# Patient Record
Sex: Male | Born: 1955 | Race: White | Hispanic: No | Marital: Married | Smoking: Never smoker
Health system: Southern US, Community
[De-identification: ages and names within clinical notes are randomized; demographics above are authoritative.]

## PROBLEM LIST (undated history)

## (undated) DIAGNOSIS — I10 Essential (primary) hypertension: Secondary | ICD-10-CM

## (undated) DIAGNOSIS — G4730 Sleep apnea, unspecified: Secondary | ICD-10-CM

## (undated) DIAGNOSIS — F419 Anxiety disorder, unspecified: Secondary | ICD-10-CM

## (undated) DIAGNOSIS — I499 Cardiac arrhythmia, unspecified: Secondary | ICD-10-CM

## (undated) DIAGNOSIS — E039 Hypothyroidism, unspecified: Secondary | ICD-10-CM

## (undated) DIAGNOSIS — I509 Heart failure, unspecified: Secondary | ICD-10-CM

## (undated) DIAGNOSIS — I4891 Unspecified atrial fibrillation: Secondary | ICD-10-CM

## (undated) DIAGNOSIS — N3281 Overactive bladder: Secondary | ICD-10-CM

## (undated) DIAGNOSIS — K219 Gastro-esophageal reflux disease without esophagitis: Secondary | ICD-10-CM

## (undated) DIAGNOSIS — M1990 Unspecified osteoarthritis, unspecified site: Secondary | ICD-10-CM

## (undated) HISTORY — DX: Cardiac arrhythmia, unspecified: I49.9

## (undated) HISTORY — DX: Heart failure, unspecified: I50.9

## (undated) HISTORY — DX: Unspecified atrial fibrillation: I48.91

## (undated) HISTORY — PX: KNEE ARTHROSCOPY: SUR90

## (undated) HISTORY — PX: HERNIA REPAIR: SHX51

## (undated) HISTORY — DX: Essential (primary) hypertension: I10

---

## 1998-05-18 ENCOUNTER — Ambulatory Visit (HOSPITAL_BASED_OUTPATIENT_CLINIC_OR_DEPARTMENT_OTHER): Admission: RE | Admit: 1998-05-18 | Discharge: 1998-05-18 | Payer: Self-pay | Admitting: Surgery

## 1998-08-08 ENCOUNTER — Ambulatory Visit (HOSPITAL_COMMUNITY): Admission: RE | Admit: 1998-08-08 | Discharge: 1998-08-08 | Payer: Self-pay | Admitting: *Deleted

## 1998-08-08 ENCOUNTER — Encounter: Payer: Self-pay | Admitting: *Deleted

## 2006-07-17 ENCOUNTER — Encounter: Admission: RE | Admit: 2006-07-17 | Discharge: 2006-07-17 | Payer: Self-pay | Admitting: Orthopedic Surgery

## 2006-09-08 ENCOUNTER — Encounter: Admission: RE | Admit: 2006-09-08 | Discharge: 2006-09-08 | Payer: Self-pay | Admitting: Orthopedic Surgery

## 2007-10-29 IMAGING — US US EXTREM LOW NON VASC*R*
1 series · 14 of 17 positions shown · non-contrast
Comparison: Outside MRI from [HOSPITAL] dated 06/27/06 and three phase bone scan of 07/17/06.

CLINICAL DATA: Palpable abnormality on left wrist.
FOCUSED RIGHT WRIST ULTRASOUND:
TECHNIQUE: Multiplanar grayscale with color ultrasound was performed at the patient's palpable abnormality about the radial aspect of the wrist.

[Series 1: us extrem low non vasc*right* · 0.06mm/px · 14 of 17 slices shown]
[im 1/17]
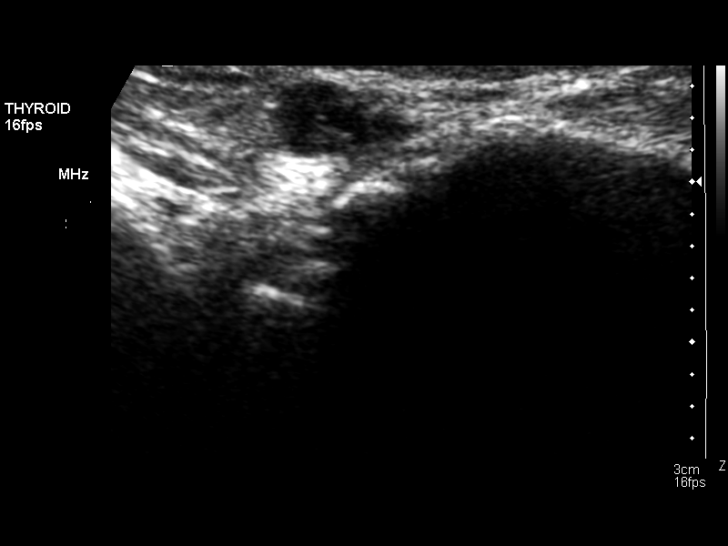
[im 2/17]
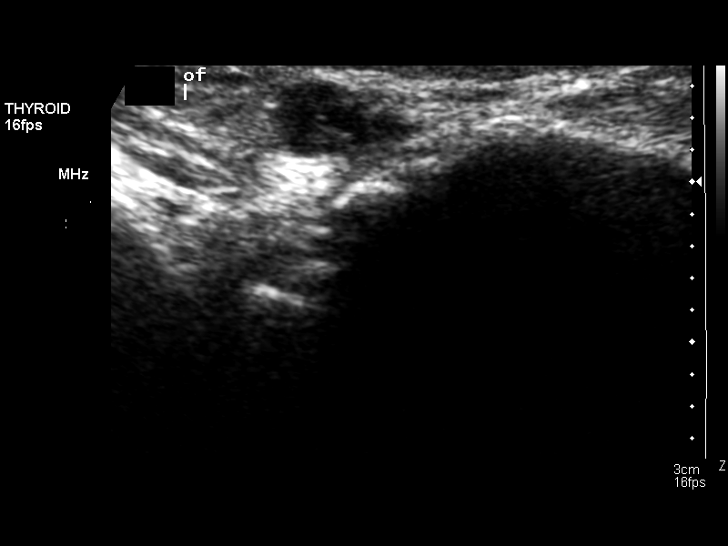
[im 4/17]
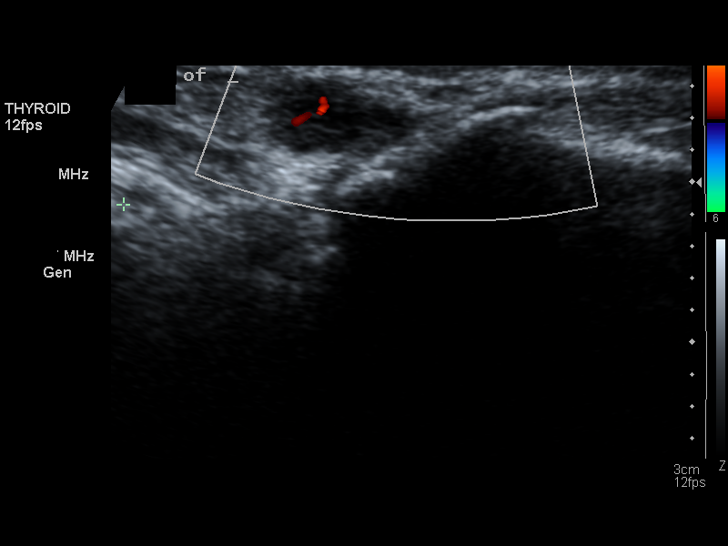
[im 5/17]
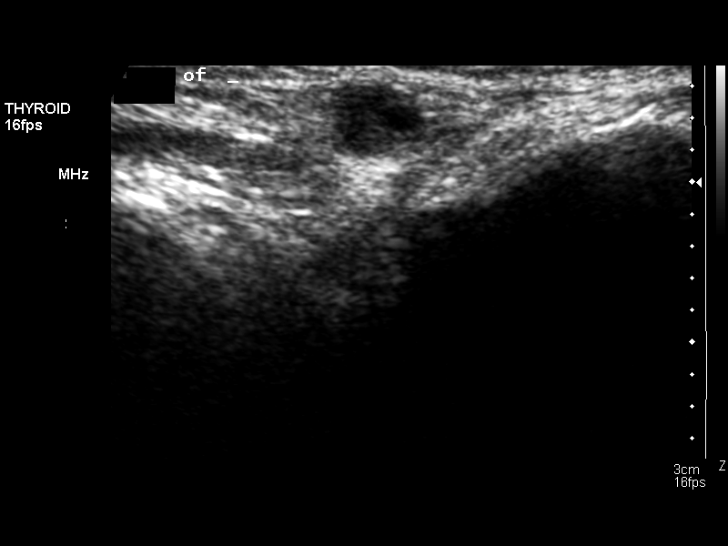
[im 6/17]
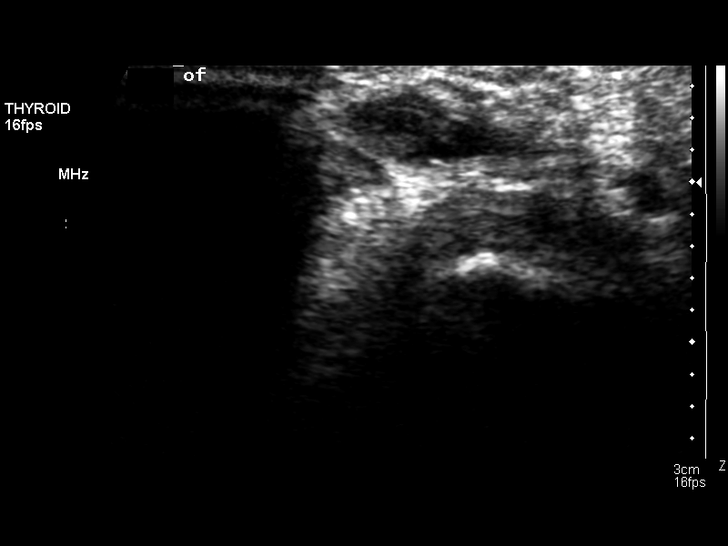
[im 7/17]
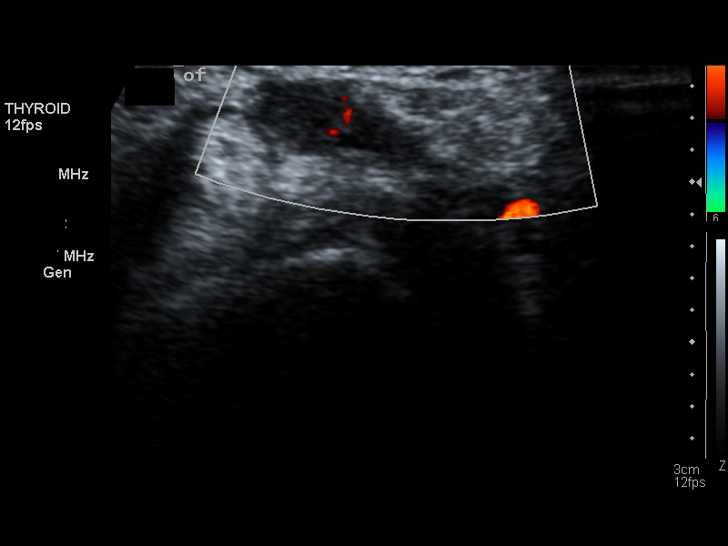
[im 8/17]
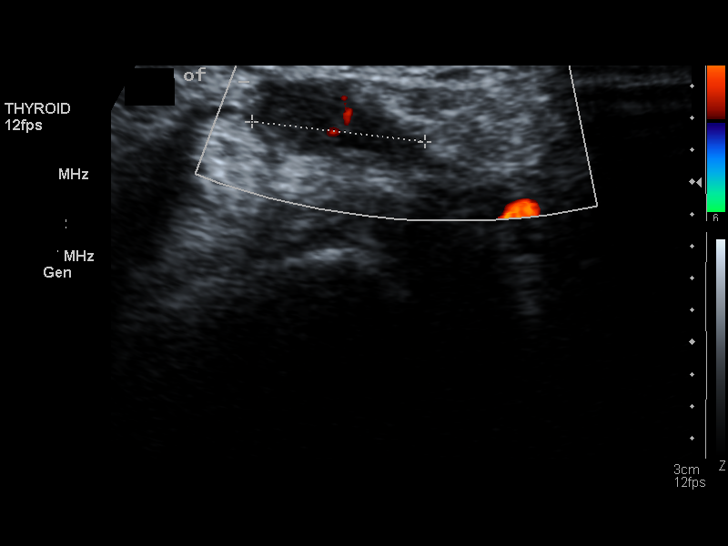
[im 10/17]
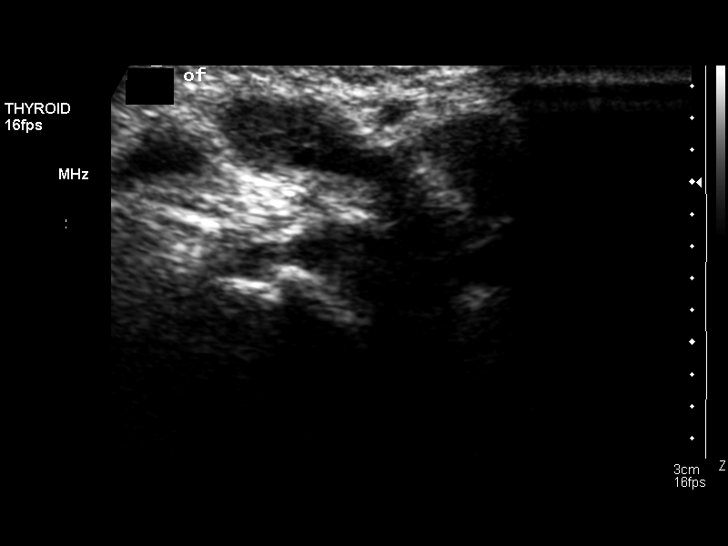
[im 11/17]
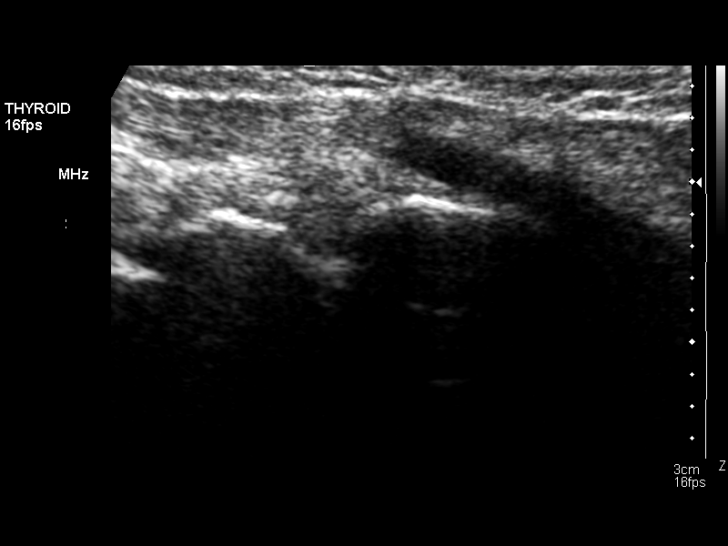
[im 12/17]
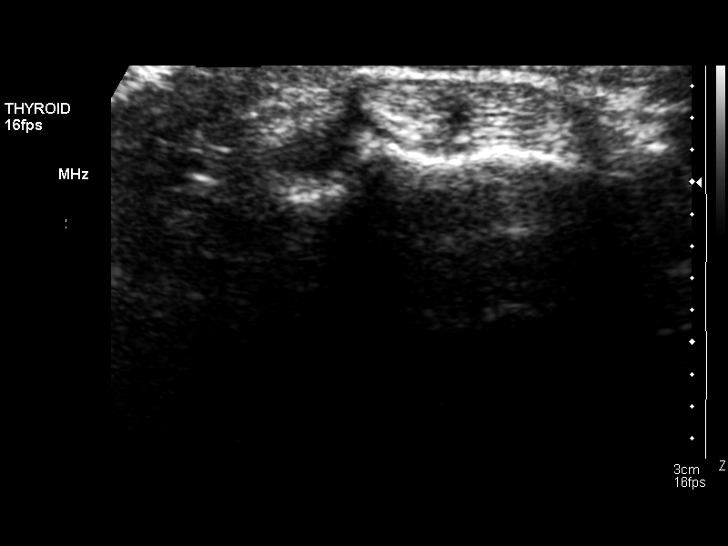
[im 13/17]
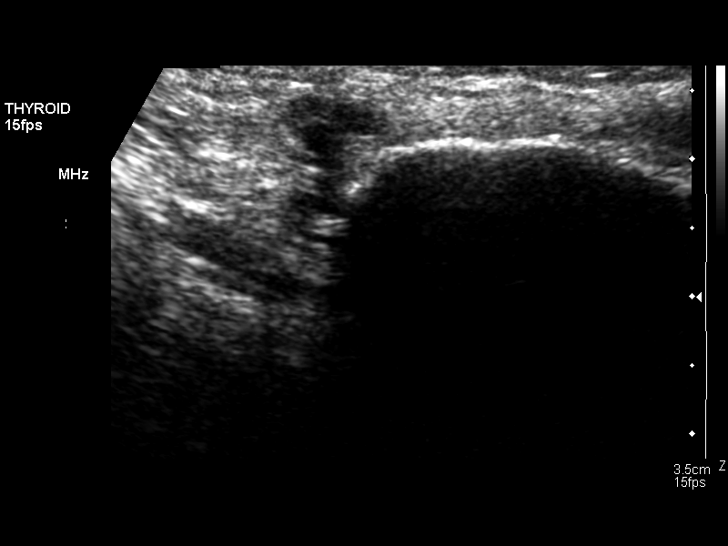
[im 14/17]
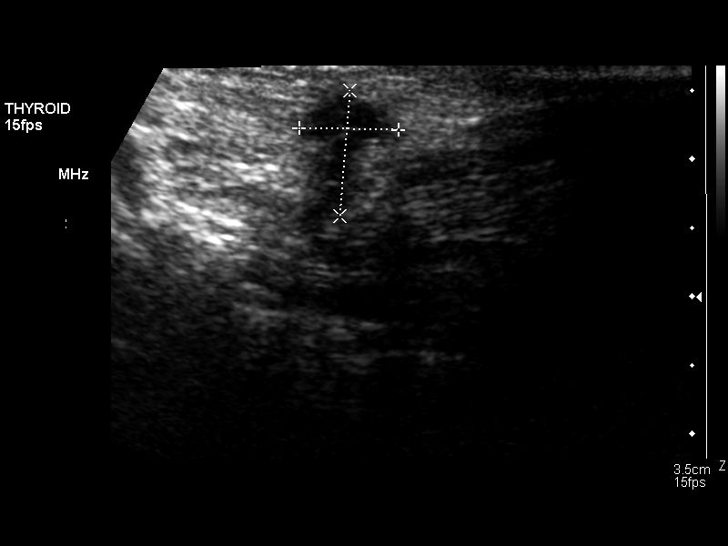
[im 16/17]
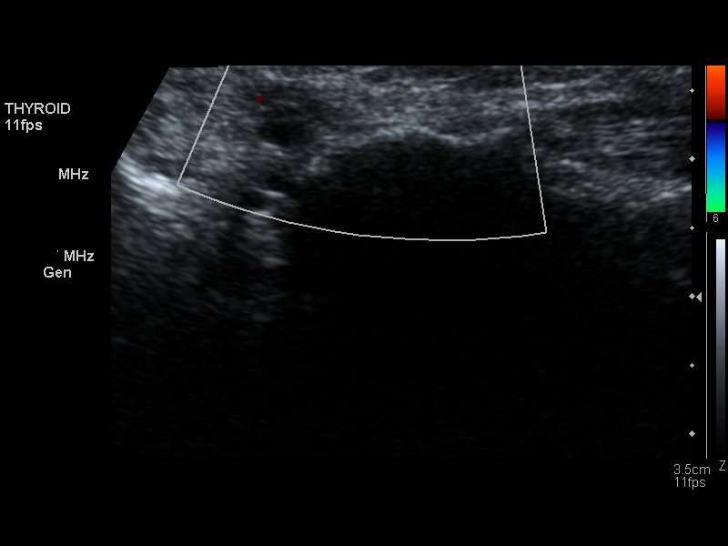
[im 17/17]
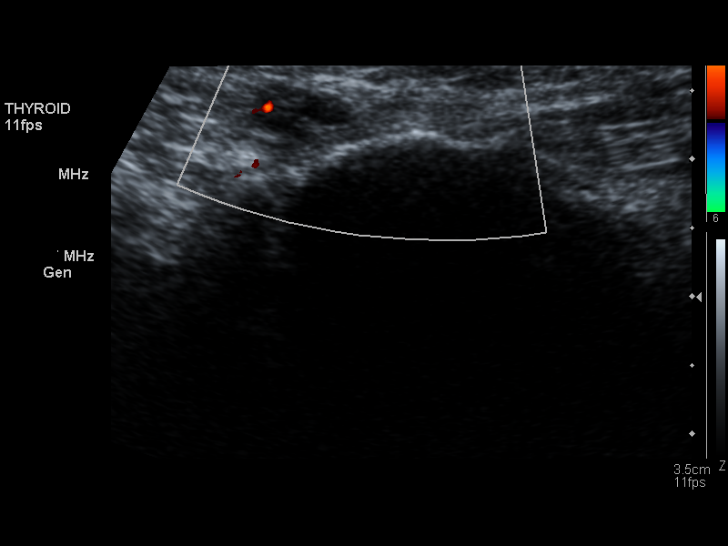

[14 of 17 positions shown; findings below may reference images not displayed]

FINDINGS: At the site of palpable abnormality about the radial aspect of the left wrist is a somewhat ill-defined irregularly shaped hypoechoic lesion which measures 9 x 5 x 11 mm.  This has areas of Doppler signal within consistent with vascularity (image 9).  It is heterogeneous without evidence of enhanced through transmission.
IMPRESSION: Nonspecific ultrasound appearance at the site of palpable abnormality about the radial aspect of the right wrist.  This does not have imaging characteristics to suggest a simple cyst.  There is internal vascularity and evidence of heterogeneity centrally.

## 2008-10-19 ENCOUNTER — Ambulatory Visit (HOSPITAL_BASED_OUTPATIENT_CLINIC_OR_DEPARTMENT_OTHER): Admission: RE | Admit: 2008-10-19 | Discharge: 2008-10-19 | Payer: Self-pay | Admitting: Urology

## 2008-10-19 ENCOUNTER — Encounter (INDEPENDENT_AMBULATORY_CARE_PROVIDER_SITE_OTHER): Payer: Self-pay | Admitting: Urology

## 2010-12-30 ENCOUNTER — Encounter: Payer: Self-pay | Admitting: Orthopedic Surgery

## 2011-04-23 NOTE — Op Note (Signed)
NAMEAMADOR, Roberto Sanford                ACCOUNT NO.:  [id number]   MEDICAL RECORD NO.:  [id number]          PATIENT TYPE:  AMB   LOCATION:  [facility]                         FACILITY:  [facility]   PHYSICIAN:  Maretta Bees. Vonita Moss, M.D.DATE OF BIRTH:  [birth year]   DATE OF PROCEDURE:  10/19/2008  DATE OF DISCHARGE:                               OPERATIVE REPORT   PREOPERATIVE DIAGNOSIS:  Rule out interstitial cystitis, penile wart.   POSTOPERATIVE DIAGNOSIS:  Rule out interstitial cystitis, penile wart  and urethral stricture.   PROCEDURE:  Cystoscopy, urethral dilation with filiform and followers  and HOD of the bladder and cold cup bladder biopsies. Excision of penile  wart.   SURGEON:  Maretta Bees. Vonita Moss, M.D.   ANESTHESIA:  General.   INDICATIONS:  This 55 year old gentleman has had a long history of  voiding symptoms with frequency, urgency and occasional urge  incontinence.  He has been treated with various anticholinergics and has  been on amitriptyline and needs further workup of his symptoms.  Also,  he has had a persistent wart on his dorsal right side of the penis that  has been treated with freezing techniques and we concluded that we would  excise it today.   PROCEDURE IN DETAIL:  The patient was brought to the operating room and  placed in lithotomy position.  He was prepped and draped in the usual  fashion.  A sharp excision of this right dorsal penile wart was  accomplished and the base was fulgurated with the electrocautery and the  wound closed with a running interlocking 4-0 chromic catgut.  The lesion  was sent to pathology.   I then cystoscoped him and he had two mild strictures in the bulbous  urethra and a very tight deep bulbous urethral stricture that required  placement of a guidewire and then dilation with Heyman dilators to 26  [place].  I then recystoscoped him and the deep bulbous stricture was  narrow and short.  The prostate was unremarkable.  The bladder had  some  erythema but it might be related to the guidewire.  He had a limited  bladder capacity of 500 mL.  Cold cup bladder biopsies were taken in  erythematous areas along the bladder base.  The biopsy sites were  fulgurated with the Bugbee electrode and he was taken to the recovery  room in good condition having received a B and O suppository and had  minimal blood loss.      Maretta Bees. Vonita Moss, M.D.  Electronically Signed     LJP/MEDQ  D:  10/19/2008  T:  10/19/2008  Job:  [id number]

## 2011-08-23 ENCOUNTER — Other Ambulatory Visit: Payer: Self-pay | Admitting: Urology

## 2011-08-23 ENCOUNTER — Ambulatory Visit (HOSPITAL_BASED_OUTPATIENT_CLINIC_OR_DEPARTMENT_OTHER)
Admission: RE | Admit: 2011-08-23 | Discharge: 2011-08-23 | Disposition: A | Discharge status: Home / Self Care | Payer: BC Managed Care – PPO | Source: Ambulatory Visit | Attending: Urology | Admitting: Urology

## 2011-08-23 DIAGNOSIS — N35919 Unspecified urethral stricture, male, unspecified site: Secondary | ICD-10-CM | POA: Insufficient documentation

## 2011-08-23 DIAGNOSIS — E162 Hypoglycemia, unspecified: Secondary | ICD-10-CM | POA: Insufficient documentation

## 2011-08-23 DIAGNOSIS — Z01810 Encounter for preprocedural cardiovascular examination: Secondary | ICD-10-CM | POA: Insufficient documentation

## 2011-08-23 DIAGNOSIS — Z79899 Other long term (current) drug therapy: Secondary | ICD-10-CM | POA: Insufficient documentation

## 2011-08-23 DIAGNOSIS — Z01812 Encounter for preprocedural laboratory examination: Secondary | ICD-10-CM | POA: Insufficient documentation

## 2011-08-23 DIAGNOSIS — R3915 Urgency of urination: Secondary | ICD-10-CM | POA: Insufficient documentation

## 2011-08-23 DIAGNOSIS — N342 Other urethritis: Secondary | ICD-10-CM | POA: Insufficient documentation

## 2011-08-23 DIAGNOSIS — G4733 Obstructive sleep apnea (adult) (pediatric): Secondary | ICD-10-CM | POA: Insufficient documentation

## 2011-08-23 DIAGNOSIS — R339 Retention of urine, unspecified: Secondary | ICD-10-CM | POA: Insufficient documentation

## 2011-08-23 DIAGNOSIS — R350 Frequency of micturition: Secondary | ICD-10-CM | POA: Insufficient documentation

## 2011-08-26 LAB — POCT HEMOGLOBIN-HEMACUE: Hemoglobin: 13.1 g/dL (ref 13.0–17.0)

## 2011-09-02 NOTE — Op Note (Signed)
NAMERAGE, BEEVER NO.:  [id number]  MEDICAL RECORD NO.:  [id number]  LOCATION:                               FACILITY:  [facility]  PHYSICIAN:  Jerilee Field, MD   DATE OF BIRTH:  November 02, 1956  DATE OF PROCEDURE: DATE OF DISCHARGE:                              OPERATIVE REPORT   PREOPERATIVE DIAGNOSIS: 1. Incomplete bladder emptying. 2. Urinary urgency and frequency. 3. Urethral stricture.  POSTOPERATIVE DIAGNOSIS: 1. Incomplete bladder emptying. 2. Urinary urgency and frequency. 3. Urethral stricture. 4. Urethritis.  PROCEDURE: 1. Cystoscopy with urethral biopsy and fulguration. 2. Balloon dilation of urethral stricture.  SURGEON:  Jerilee Field, MD.  ANESTHESIA:  General.  INDICATIONS FOR PROCEDURE:  Mr. Roberto Sanford has a long history of frequency and urgency, which has been worsening over recent months.  He underwent cystoscopy in the office which showed annular white exudate all along the bulb of urethra and in the deep bulb close to the membranous urethra, there was a tight urethral stricture of about 5 [place].  A uroflow was obtained, the patient voided with a slow string and had a postvoid over 200 mL.  I discussed the findings with the patient, the nature, risks, benefits, and alternatives to balloon dilation of stricture.  All questions were answered and the patient elected to proceed with balloon dilation.  We did discuss initial success rate as good with balloon dilation, but recurrence was likely.  FINDINGS:  On cystoscopy, the penile urethra was normal. All along the bulb of urethra, there were annular patches of white exudate.  For the most part, this did wash off with the scope.  He did have group B strep in the urine preoperatively and this may be of overgrowth of bacteria. A portion of this was biopsied to ensure no malignancy.  In the deep bulb there was a tight stricture of approximately 5 [place].  This balloon dilated without  difficulty.  The prostate was normal with moderate bilobar hypertrophy and 100% visual occlusion.  The bladder was normal apart from some midline irritation from the guidewire that was advanced.  There were no stones, tumors, or foreign bodies in the bladder.  The trigone and ureteral orifices were also normal.  DESCRIPTION OF PROCEDURE:  After consent was obtained, the patient was brought to the operating room.  A time-out was formed to confirm the patient and procedure.  He was given preop antibiotics and SCDs were in place.  He was placed in lithotomy.  External genitalia prepped and draped in the usual fashion.  The meatus and urethra dilated to 24- [place] to lubricate the urethra without difficulty.  22-French rigid cystoscope was passed per urethra.  The previous findings were noted. Portion of the distal bulb was biopsied with the rigid biopsy forceps and sent to pathology and lightly fulgurated with the Bugbee electrode. Passing the scope in and out did scrape of most of this exudate and the penile and bulb urothelium otherwise appeared normal.  Representative photos were taken proximal to this in the deep bulb, a few millimeters from the membranous urethra, there was a tight stricture.  A sensor wire was advanced through this and up into  the bladder which was confirmed fluoroscopically.  24-French 10 cm balloon was then advanced over the wire and inflated.  The balloon showed wasting at the stricture and then opened fully.  The balloon was left inflated for 3 minutes and then removed.  Cystoscopy revealed excellent dilation of the stricture and cystoscopy was then carried out with the previous findings.  The bladder was inspected in its entirety with the 12-degree and the 70-degree lens. Scope was then removed and an 18-French Foley catheter was placed draining clear urine.  The balloon was inflated and seated at the bladder neck and left to gravity drainage. He was then  awakened, taken to recovery room in stable condition.  ESTIMATED BLOOD LOSS:  Less than 5 mL.  COMPLICATIONS:  None.  SPECIMENS:  Urethral biopsy.  DISPOSITION:  Patient stable to PACU.          ______________________________ Jerilee Field, MD     ME/MEDQ  D:  08/23/2011  T:  08/24/2011  Job:  [id number]  Electronically Signed by Jerilee Field MD on 09/02/2011 12:36:12 PM

## 2011-09-10 LAB — POCT HEMOGLOBIN-HEMACUE: Hemoglobin: 15.3

## 2013-04-13 ENCOUNTER — Other Ambulatory Visit (HOSPITAL_COMMUNITY): Payer: Self-pay | Admitting: Internal Medicine

## 2013-04-13 DIAGNOSIS — E669 Obesity, unspecified: Secondary | ICD-10-CM

## 2013-04-13 DIAGNOSIS — I219 Acute myocardial infarction, unspecified: Secondary | ICD-10-CM

## 2013-04-13 DIAGNOSIS — D599 Acquired hemolytic anemia, unspecified: Secondary | ICD-10-CM

## 2013-06-29 ENCOUNTER — Other Ambulatory Visit (HOSPITAL_COMMUNITY): Payer: Self-pay | Admitting: Internal Medicine

## 2013-06-29 DIAGNOSIS — E669 Obesity, unspecified: Secondary | ICD-10-CM

## 2013-08-04 ENCOUNTER — Ambulatory Visit (HOSPITAL_COMMUNITY)
Admission: RE | Admit: 2013-08-04 | Discharge: 2013-08-04 | Disposition: A | Discharge status: Home / Self Care | Payer: BC Managed Care – PPO | Source: Ambulatory Visit | Attending: Cardiovascular Disease | Admitting: Cardiovascular Disease

## 2013-08-04 DIAGNOSIS — E669 Obesity, unspecified: Secondary | ICD-10-CM | POA: Insufficient documentation

## 2015-04-11 ENCOUNTER — Other Ambulatory Visit: Payer: Self-pay | Admitting: Orthopedic Surgery

## 2015-04-25 ENCOUNTER — Encounter (HOSPITAL_BASED_OUTPATIENT_CLINIC_OR_DEPARTMENT_OTHER): Payer: Self-pay | Admitting: *Deleted

## 2015-04-27 ENCOUNTER — Ambulatory Visit (HOSPITAL_BASED_OUTPATIENT_CLINIC_OR_DEPARTMENT_OTHER)
Admission: RE | Admit: 2015-04-27 | Discharge: 2015-04-27 | Disposition: A | Discharge status: Home / Self Care | Payer: BLUE CROSS/BLUE SHIELD | Source: Ambulatory Visit | Attending: Orthopedic Surgery | Admitting: Orthopedic Surgery

## 2015-04-27 ENCOUNTER — Encounter (HOSPITAL_BASED_OUTPATIENT_CLINIC_OR_DEPARTMENT_OTHER)
Admission: RE | Disposition: A | Discharge status: Home / Self Care | Payer: Self-pay | Source: Ambulatory Visit | Attending: Orthopedic Surgery

## 2015-04-27 ENCOUNTER — Encounter (HOSPITAL_BASED_OUTPATIENT_CLINIC_OR_DEPARTMENT_OTHER): Payer: Self-pay | Admitting: Orthopedic Surgery

## 2015-04-27 ENCOUNTER — Ambulatory Visit (HOSPITAL_BASED_OUTPATIENT_CLINIC_OR_DEPARTMENT_OTHER): Payer: BLUE CROSS/BLUE SHIELD | Admitting: Certified Registered"

## 2015-04-27 DIAGNOSIS — G4730 Sleep apnea, unspecified: Secondary | ICD-10-CM | POA: Insufficient documentation

## 2015-04-27 DIAGNOSIS — M65341 Trigger finger, right ring finger: Secondary | ICD-10-CM | POA: Diagnosis not present

## 2015-04-27 DIAGNOSIS — Z9989 Dependence on other enabling machines and devices: Secondary | ICD-10-CM | POA: Insufficient documentation

## 2015-04-27 DIAGNOSIS — M1990 Unspecified osteoarthritis, unspecified site: Secondary | ICD-10-CM | POA: Insufficient documentation

## 2015-04-27 DIAGNOSIS — G5601 Carpal tunnel syndrome, right upper limb: Secondary | ICD-10-CM | POA: Insufficient documentation

## 2015-04-27 DIAGNOSIS — F419 Anxiety disorder, unspecified: Secondary | ICD-10-CM | POA: Insufficient documentation

## 2015-04-27 DIAGNOSIS — G5602 Carpal tunnel syndrome, left upper limb: Secondary | ICD-10-CM | POA: Diagnosis not present

## 2015-04-27 DIAGNOSIS — K219 Gastro-esophageal reflux disease without esophagitis: Secondary | ICD-10-CM | POA: Diagnosis not present

## 2015-04-27 DIAGNOSIS — Z79899 Other long term (current) drug therapy: Secondary | ICD-10-CM | POA: Insufficient documentation

## 2015-04-27 DIAGNOSIS — M65331 Trigger finger, right middle finger: Secondary | ICD-10-CM | POA: Insufficient documentation

## 2015-04-27 DIAGNOSIS — Z6841 Body Mass Index (BMI) 40.0 and over, adult: Secondary | ICD-10-CM | POA: Diagnosis not present

## 2015-04-27 DIAGNOSIS — Z791 Long term (current) use of non-steroidal anti-inflammatories (NSAID): Secondary | ICD-10-CM | POA: Insufficient documentation

## 2015-04-27 DIAGNOSIS — E039 Hypothyroidism, unspecified: Secondary | ICD-10-CM | POA: Insufficient documentation

## 2015-04-27 DIAGNOSIS — N3281 Overactive bladder: Secondary | ICD-10-CM | POA: Diagnosis not present

## 2015-04-27 HISTORY — PX: TRIGGER FINGER RELEASE: SHX641

## 2015-04-27 HISTORY — DX: Sleep apnea, unspecified: G47.30

## 2015-04-27 HISTORY — DX: Anxiety disorder, unspecified: F41.9

## 2015-04-27 HISTORY — PX: CARPAL TUNNEL RELEASE: SHX101

## 2015-04-27 HISTORY — DX: Unspecified osteoarthritis, unspecified site: M19.90

## 2015-04-27 HISTORY — DX: Gastro-esophageal reflux disease without esophagitis: K21.9

## 2015-04-27 HISTORY — DX: Hypothyroidism, unspecified: E03.9

## 2015-04-27 HISTORY — DX: Overactive bladder: N32.81

## 2015-04-27 LAB — POCT HEMOGLOBIN-HEMACUE: HEMOGLOBIN: 13.9 g/dL (ref 13.0–17.0)

## 2015-04-27 SURGERY — RELEASE, A1 PULLEY, FOR TRIGGER FINGER
Anesthesia: General | Site: Wrist | Laterality: Right

## 2015-04-27 MED ORDER — BUPIVACAINE HCL (PF) 0.25 % IJ SOLN
INTRAMUSCULAR | Status: AC
  Filled 2015-04-27: qty 30

## 2015-04-27 MED ORDER — MIDAZOLAM HCL 2 MG/2ML IJ SOLN
1.0000 mg | INTRAMUSCULAR | Status: DC | PRN
  Administered 2015-04-27: 2 mg via INTRAVENOUS

## 2015-04-27 MED ORDER — FENTANYL CITRATE (PF) 100 MCG/2ML IJ SOLN
INTRAMUSCULAR | Status: AC
  Filled 2015-04-27: qty 4

## 2015-04-27 MED ORDER — HYDROMORPHONE HCL 1 MG/ML IJ SOLN: 0.2500 mg | INTRAMUSCULAR | Status: DC | PRN

## 2015-04-27 MED ORDER — LIDOCAINE HCL (CARDIAC) 20 MG/ML IV SOLN
INTRAVENOUS | Status: DC | PRN
  Administered 2015-04-27: 50 mg via INTRAVENOUS

## 2015-04-27 MED ORDER — BUPIVACAINE HCL (PF) 0.25 % IJ SOLN
INTRAMUSCULAR | Status: DC | PRN
  Administered 2015-04-27: 10 mL

## 2015-04-27 MED ORDER — ONDANSETRON HCL 4 MG/2ML IJ SOLN
INTRAMUSCULAR | Status: DC | PRN
  Administered 2015-04-27: 4 mg via INTRAVENOUS

## 2015-04-27 MED ORDER — CHLORHEXIDINE GLUCONATE 4 % EX LIQD: 60.0000 mL | Freq: Once | CUTANEOUS | Status: DC

## 2015-04-27 MED ORDER — CEFAZOLIN SODIUM-DEXTROSE 2-3 GM-% IV SOLR: 2.0000 g | INTRAVENOUS | Status: DC

## 2015-04-27 MED ORDER — GLYCOPYRROLATE 0.2 MG/ML IJ SOLN: 0.2000 mg | Freq: Once | INTRAMUSCULAR | Status: DC | PRN

## 2015-04-27 MED ORDER — PROPOFOL 10 MG/ML IV BOLUS
INTRAVENOUS | Status: DC | PRN
  Administered 2015-04-27: 30 mg via INTRAVENOUS
  Administered 2015-04-27: 200 mg via INTRAVENOUS
  Administered 2015-04-27: 100 mg via INTRAVENOUS

## 2015-04-27 MED ORDER — CEFAZOLIN SODIUM-DEXTROSE 2-3 GM-% IV SOLR
INTRAVENOUS | Status: AC
  Filled 2015-04-27: qty 50

## 2015-04-27 MED ORDER — PROPOFOL 500 MG/50ML IV EMUL
INTRAVENOUS | Status: AC
  Filled 2015-04-27: qty 50

## 2015-04-27 MED ORDER — FENTANYL CITRATE (PF) 100 MCG/2ML IJ SOLN
INTRAMUSCULAR | Status: DC | PRN
  Administered 2015-04-27: 50 ug via INTRAVENOUS
  Administered 2015-04-27: 100 ug via INTRAVENOUS

## 2015-04-27 MED ORDER — HYDROCODONE-ACETAMINOPHEN 5-325 MG PO TABS: 1.0000 | ORAL_TABLET | Freq: Four times a day (QID) | ORAL | Status: DC | PRN

## 2015-04-27 MED ORDER — DEXAMETHASONE SODIUM PHOSPHATE 10 MG/ML IJ SOLN
INTRAMUSCULAR | Status: DC | PRN
  Administered 2015-04-27: 10 mg via INTRAVENOUS

## 2015-04-27 MED ORDER — MIDAZOLAM HCL 2 MG/2ML IJ SOLN
INTRAMUSCULAR | Status: AC
  Filled 2015-04-27: qty 2

## 2015-04-27 MED ORDER — MEPERIDINE HCL 25 MG/ML IJ SOLN: 6.2500 mg | INTRAMUSCULAR | Status: DC | PRN

## 2015-04-27 MED ORDER — CEFAZOLIN SODIUM 1-5 GM-% IV SOLN
INTRAVENOUS | Status: AC
  Filled 2015-04-27: qty 50

## 2015-04-27 MED ORDER — DEXTROSE 5 % IV SOLN
3.0000 g | INTRAVENOUS | Status: AC
  Administered 2015-04-27: 3 g via INTRAVENOUS

## 2015-04-27 MED ORDER — OXYCODONE HCL 5 MG PO TABS: 5.0000 mg | ORAL_TABLET | Freq: Once | ORAL | Status: DC | PRN

## 2015-04-27 MED ORDER — FENTANYL CITRATE (PF) 100 MCG/2ML IJ SOLN: 50.0000 ug | INTRAMUSCULAR | Status: DC | PRN

## 2015-04-27 MED ORDER — LACTATED RINGERS IV SOLN
INTRAVENOUS | Status: DC
  Administered 2015-04-27 (×2): via INTRAVENOUS

## 2015-04-27 MED ORDER — OXYCODONE HCL 5 MG/5ML PO SOLN: 5.0000 mg | Freq: Once | ORAL | Status: DC | PRN

## 2015-04-27 SURGICAL SUPPLY — 39 items
BANDAGE COBAN STERILE 2 (GAUZE/BANDAGES/DRESSINGS) IMPLANT
BLADE SURG 15 STRL LF DISP TIS (BLADE) ×2 IMPLANT
BLADE SURG 15 STRL SS (BLADE) ×1
BNDG COHESIVE 3X5 TAN STRL LF (GAUZE/BANDAGES/DRESSINGS) ×3 IMPLANT
BNDG ESMARK 4X9 LF (GAUZE/BANDAGES/DRESSINGS) IMPLANT
BNDG GAUZE ELAST 4 BULKY (GAUZE/BANDAGES/DRESSINGS) ×3 IMPLANT
CHLORAPREP W/TINT 26ML (MISCELLANEOUS) ×3 IMPLANT
CORDS BIPOLAR (ELECTRODE) ×3 IMPLANT
COVER BACK TABLE 60X90IN (DRAPES) ×3 IMPLANT
COVER MAYO STAND STRL (DRAPES) ×3 IMPLANT
CUFF TOURNIQUET SINGLE 18IN (TOURNIQUET CUFF) ×3 IMPLANT
DECANTER SPIKE VIAL GLASS SM (MISCELLANEOUS) IMPLANT
DRAPE EXTREMITY T 121X128X90 (DRAPE) ×3 IMPLANT
DRAPE SURG 17X23 STRL (DRAPES) ×3 IMPLANT
DRSG PAD ABDOMINAL 8X10 ST (GAUZE/BANDAGES/DRESSINGS) ×3 IMPLANT
GAUZE SPONGE 4X4 12PLY STRL (GAUZE/BANDAGES/DRESSINGS) ×3 IMPLANT
GAUZE XEROFORM 1X8 LF (GAUZE/BANDAGES/DRESSINGS) ×3 IMPLANT
GLOVE BIO SURGEON STRL SZ 6.5 (GLOVE) ×3 IMPLANT
GLOVE BIOGEL PI IND STRL 7.0 (GLOVE) ×2 IMPLANT
GLOVE BIOGEL PI IND STRL 7.5 (GLOVE) ×2 IMPLANT
GLOVE BIOGEL PI IND STRL 8.5 (GLOVE) ×2 IMPLANT
GLOVE BIOGEL PI INDICATOR 7.0 (GLOVE) ×1
GLOVE BIOGEL PI INDICATOR 7.5 (GLOVE) ×1
GLOVE BIOGEL PI INDICATOR 8.5 (GLOVE) ×1
GLOVE SURG ORTHO 8.0 STRL STRW (GLOVE) ×3 IMPLANT
GLOVE SURG SS PI 7.5 STRL IVOR (GLOVE) ×3 IMPLANT
GOWN STRL REUS W/ TWL LRG LVL3 (GOWN DISPOSABLE) ×2 IMPLANT
GOWN STRL REUS W/TWL LRG LVL3 (GOWN DISPOSABLE) ×1
GOWN STRL REUS W/TWL XL LVL3 (GOWN DISPOSABLE) ×6 IMPLANT
NEEDLE PRECISIONGLIDE 27X1.5 (NEEDLE) ×3 IMPLANT
NS IRRIG 1000ML POUR BTL (IV SOLUTION) ×3 IMPLANT
PACK BASIN DAY SURGERY FS (CUSTOM PROCEDURE TRAY) ×3 IMPLANT
STOCKINETTE 4X48 STRL (DRAPES) ×3 IMPLANT
SUT ETHILON 4 0 PS 2 18 (SUTURE) ×3 IMPLANT
SUT VICRYL 4-0 PS2 18IN ABS (SUTURE) IMPLANT
SYR BULB 3OZ (MISCELLANEOUS) ×3 IMPLANT
SYR CONTROL 10ML LL (SYRINGE) ×3 IMPLANT
TOWEL OR 17X24 6PK STRL BLUE (TOWEL DISPOSABLE) ×3 IMPLANT
UNDERPAD 30X30 (UNDERPADS AND DIAPERS) ×3 IMPLANT

## 2015-04-27 NOTE — Anesthesia Preprocedure Evaluation (Signed)
Anesthesia Evaluation  Patient identified by MRN, date of birth, ID band Patient awake    Reviewed: Allergy & Precautions, NPO status , Patient's Chart, lab work & pertinent test results  Airway Mallampati: II  TM Distance: >3 FB Neck ROM: Full    Dental  (+) Teeth Intact, Dental Advisory Given   Pulmonary  breath sounds clear to auscultation        Cardiovascular Rhythm:Regular Rate:Normal     Neuro/Psych    GI/Hepatic   Endo/Other  Hypothyroidism Morbid obesity  Renal/GU      Musculoskeletal   Abdominal   Peds  Hematology   Anesthesia Other Findings   Reproductive/Obstetrics                             Anesthesia Physical Anesthesia Plan  ASA: II  Anesthesia Plan: General   Post-op Pain Management:    Induction: Intravenous  Airway Management Planned: LMA  Additional Equipment:   Intra-op Plan:   Post-operative Plan: Extubation in OR  Informed Consent: I have reviewed the patients History and Physical, chart, labs and discussed the procedure including the risks, benefits and alternatives for the proposed anesthesia with the patient or authorized representative who has indicated his/her understanding and acceptance.   Dental advisory given  Plan Discussed with: CRNA, Anesthesiologist and Surgeon  Anesthesia Plan Comments:         Anesthesia Quick Evaluation

## 2015-04-27 NOTE — Brief Op Note (Signed)
04/27/2015  10:19 AM  PATIENT:  Roberto Sanford  59 y.o. male  PRE-OPERATIVE DIAGNOSIS:  TRIGGER RIGHT MIDDLE FINGER, TRIGGER RIGHT RING FINGER RIGHT CARPAL TUNNEL SYNDROME   POST-OPERATIVE DIAGNOSIS:  TRIGGER RIGHT MIDDLE FINGER, TRIGGER RIGHT RING FINGER RIGHT CARPAL TUNNEL SYNDROME   PROCEDURE:  Procedure(s): RELEASE A-1 PULLEY PULLEY RIGHT MIDDLE FINGER, RELEASE A-1 PULLEY RIGHT RING FINGER  (Right) CARPAL TUNNEL RELEASE RIGHT  (Right)  SURGEON:  Surgeon(s) and Role:    * Daryll Brod, MD - Primary  PHYSICIAN ASSISTANT:   ASSISTANTS: none   ANESTHESIA:   local and general  EBL:  Total I/O In: 1000 [I.V.:1000] Out: -   BLOOD ADMINISTERED:none  DRAINS: none   LOCAL MEDICATIONS USED:  BUPIVICAINE   SPECIMEN:  No Specimen  DISPOSITION OF SPECIMEN:  N/A  COUNTS:  YES  TOURNIQUET:   Total Tourniquet Time Documented: Forearm (Right) - 23 minutes Total: Forearm (Right) - 23 minutes   DICTATION: .Other Dictation: Dictation Number [id number]  PLAN OF CARE: Discharge to home after PACU  PATIENT DISPOSITION:  PACU - hemodynamically stable.

## 2015-04-27 NOTE — Op Note (Signed)
Dictation Number [id number]

## 2015-04-27 NOTE — Discharge Instructions (Signed)

## 2015-04-27 NOTE — H&P (Signed)
Roberto Sanford is a 59 year-old right-hand dominant male who has not been seen in years.  He is complaining of catching of his right ring and right middle fingers. This has been going on for six months.  He has prior history of carpal tunnel syndrome which I saw him for multiple years ago.  He has not had any further treatment to this. He recalls no treatment of injury to his hand, no history of injury to his neck.  He has history of thyroid problems, arthritis,  he is prediabetic.  There is family history of diabetes and arthritis.  He complains of a constant, moderate to severe, sharp burning type pain with a feeling of numbness awakening him at night.  He states this is gradually getting worse.  Stretching seems to help. He is on Celebrex.    ALLERGIES:    None.  MEDICATIONS:     Glucosamine and chondroitin sulfate, fish oil, pantoprazole, Celebrex, Vesicare, levothyroxine, vitamin B6, and amitriptyline.    SURGICAL HISTORY:      Hernia repair and knee surgery.  FAMILY MEDICAL HISTORY:     Positive for diabetes, arthritis.  SOCIAL HISTORY:     He does not smoke or drink.  He is a Administrator for [employer].    REVIEW OF SYSTEMS:    Negative 14 points. Roberto Sanford is an 59 y.o. male.   Chief Complaint: CTS right hand and trigger fingers right middle and ring fingers HPI: see above  Past Medical History  Diagnosis Date  . Sleep apnea     uses CPAP nightly  . Anxiety   . Arthritis   . GERD (gastroesophageal reflux disease)   . Overactive bladder     takes vesicare and elavil  . Hypothyroidism     Past Surgical History  Procedure Laterality Date  . Hernia repair    . Knee arthroscopy Left     History reviewed. No pertinent family history. Social History:  reports that he has never smoked. He does not have any smokeless tobacco history on file. He reports that he does not drink alcohol or use illicit drugs.  Allergies: No Known Allergies  No prescriptions prior to admission    No  results found for this or any previous visit (from the past 48 hour(s)).  No results found.   Pertinent items are noted in HPI.  Height 5\' 8"  (1.727 m), weight 124.739 kg (275 lb).  General appearance: alert, cooperative and appears stated age Head: Normocephalic, without obvious abnormality Neck: no JVD Resp: clear to auscultation bilaterally Cardio: regular rate and rhythm, S1, S2 normal, no murmur, click, rub or gallop GI: soft, non-tender; bowel sounds normal; no masses,  no organomegaly Extremities: numbness right hand catching middle and ring fingers right Pulses: 2+ and symmetric Skin: Skin color, texture, turgor normal. No rashes or lesions Neurologic: Grossly normal Incision/Wound: na  Assessment/Plan NERVE STUDIES:   Nerve conductions were performed today revealing carpal tunnel syndrome bilaterally with motor delay of 4.2 on the left no acute distress 4.1 right;  sensory delay of 2.8 left no acute distress 2.6 right;  amplitude diminution 24 left and 23.7 right.  DIAGNOSIS:      Stenosing tenosynovitis right middle, right ring fingers.  Bilateral carpal tunnel syndrome.    RECOMMENDATIONS/PLAN:    We discussed possibility of injection of these with him.  He has deferred this and would like to proceed to surgical intervention.  The pre, peri and postoperative course were discussed along  with the risks and complications.  The patient is aware there is no guarantee with the surgery, possibility of infection, recurrence, injury to arteries, nerves, tendons, incomplete relief of symptoms and dystrophy.  He is tentatively scheduled for  release A-1 pulley right middle, right ring finger, and carpal tunnel release right hand.  Roberto Sanford R 04/27/2015, 6:39 AM

## 2015-04-27 NOTE — Anesthesia Postprocedure Evaluation (Signed)
  Anesthesia Post-op Note  Patient: Roberto Sanford  Procedure(s) Performed: Procedure(s): RELEASE A-1 PULLEY PULLEY RIGHT MIDDLE FINGER, RELEASE A-1 PULLEY RIGHT RING FINGER  (Right) CARPAL TUNNEL RELEASE RIGHT  (Right)  Patient Location: PACU  Anesthesia Type: General   Level of Consciousness: awake, alert  and oriented  Airway and Oxygen Therapy: Patient Spontanous Breathing  Post-op Pain: mild  Post-op Assessment: Post-op Vital signs reviewed  Post-op Vital Signs: Reviewed  Last Vitals:  Filed Vitals:   04/27/15 1130  BP: 156/93  Pulse: 64  Temp: 36.4 C  Resp: 20    Complications: No apparent anesthesia complications

## 2015-04-27 NOTE — Anesthesia Procedure Notes (Signed)
Procedure Name: LMA Insertion Date/Time: 04/27/2015 9:39 AM Performed by: Baxter Flattery Pre-anesthesia Checklist: Patient identified, Emergency Drugs available, Suction available and Patient being monitored Patient Re-evaluated:Patient Re-evaluated prior to inductionOxygen Delivery Method: Circle System Utilized Preoxygenation: Pre-oxygenation with 100% oxygen Intubation Type: IV induction Ventilation: Mask ventilation without difficulty LMA: LMA inserted LMA Size: 5.0 Number of attempts: 1 Airway Equipment and Method: Bite block Placement Confirmation: positive ETCO2 and breath sounds checked- equal and bilateral Tube secured with: Tape Dental Injury: Teeth and Oropharynx as per pre-operative assessment

## 2015-04-27 NOTE — Transfer of Care (Signed)
Immediate Anesthesia Transfer of Care Note  Patient: Roberto Sanford  Procedure(s) Performed: Procedure(s): RELEASE A-1 PULLEY PULLEY RIGHT MIDDLE FINGER, RELEASE A-1 PULLEY RIGHT RING FINGER  (Right) CARPAL TUNNEL RELEASE RIGHT  (Right)  Patient Location: PACU  Anesthesia Type:General  Level of Consciousness: awake, sedated and responds to stimulation  Airway & Oxygen Therapy: Patient Spontanous Breathing and Patient connected to face mask oxygen  Post-op Assessment: Report given to RN, Post -op Vital signs reviewed and stable and Patient moving all extremities  Post vital signs: Reviewed and stable  Last Vitals:  Filed Vitals:   04/27/15 0822  BP: 137/83  Pulse: 63  Temp: 36.6 C  Resp: 20    Complications: No apparent anesthesia complications

## 2015-04-28 ENCOUNTER — Encounter (HOSPITAL_BASED_OUTPATIENT_CLINIC_OR_DEPARTMENT_OTHER): Payer: Self-pay | Admitting: Orthopedic Surgery

## 2015-04-28 NOTE — Op Note (Signed)
Roberto Sanford, Roberto Sanford                ACCOUNT NO.:  [id number]  MEDICAL RECORD NO.:  [id number]  LOCATION:                                FACILITY:  [facility]  PHYSICIAN:  Daryll Brod, M.D.       DATE OF BIRTH:  23-Sep-1956  DATE OF PROCEDURE:  04/27/2015 DATE OF DISCHARGE:  04/27/2015                              OPERATIVE REPORT   PREOPERATIVE DIAGNOSES:  Stenosing tenosynovitis, right middle, right ring finger, with carpal tunnel syndrome, right hand.  POSTOPERATIVE DIAGNOSES:  Stenosing tenosynovitis, right middle, right ring finger, with carpal tunnel syndrome, right hand.  OPERATIONS: 1. Decompression of right median nerve. 2. Release of A1 pulley, right middle, right ring fingers.  SURGEON:  Daryll Brod, M.D.  ANESTHESIA:  General with local infiltration.  ANESTHESIOLOGIST:  Lorrene Reid, M.D.  HISTORY:  The patient is a 59 year old male with a history of triggering of his middle and ring finger, right hand, along with carpal tunnel syndrome.  Nerve conductions are positive.  This has not responded to conservative treatment and he has elected to undergo surgical release of the A1 pulley of the right middle and right ring fingers and carpal tunnel release of his right hand.  He is aware that there is no guarantee with the surgery; possibility of infection; recurrence of injury to arteries, nerves, tendons; incomplete relief of symptoms; and dystrophy.  In the preoperative area, the patient was seen, the extremity marked by both the patient and surgeon.  Antibiotics given.  PROCEDURE IN DETAIL:  The patient was brought to the operating room, where a general anesthetic was carried out without difficulty.  He was prepped using ChloraPrep in supine position with right arm free.  A 3- minute dry time was allowed.  Time-out taken, confirming the patient and procedure.  The middle finger was attended to first.  An oblique incision was made over the A1 pulley of the right middle finger,  carried down through subcutaneous tissue.  Bleeders were electrocauterized. Dissection carried down to the A1 pulley.  Retractors placed, protecting neurovascular structures radially and ulnarly.  The A1 pulley was released on its radial aspect.  Small incision made centrally in A2. Partial tenosynovectomy performed proximally with separation of the 2 tendons to be certain they were not adherent.  Finger was placed through a full range of motion, no further triggering was noted.  A separate incision was then made obliquely over the ring finger A1 pulley, carried down through subcutaneous tissue.  Retractor was again placed, protecting neurovascular structures radially and ulnarly.  An incision was made on the radial aspect of the A1 pulley.  The small incision made centrally in A2.  Partial tenosynovectomy with separation of superficialis profundus tendon was then performed to be certain there was no adherence to each of these.  Wounds were then irrigated and closed with interrupted 4-0 nylon sutures.  No further triggering was noted in either with passive flexion, extension.  A separate incision was then made longitudinally in the right palm, carried down through subcutaneous tissue.  Bleeders were electrocauterized with bipolar. Palmar fascia was split.  Superficial palmar arch identified.  The flexor tendon to the ring and  little fingers identified to the ulnar side of the median nerve.  The carpal retinaculum was incised with sharp dissection.  Right angle and Sewall retractor were placed between skin and forearm fascia.  The fascia was released for approximately 2 cm proximal to the wrist crease under direct vision.  Canal was explored. Area of compression to the nerve was apparent.  Motor branch entered into muscle distally.  The wound was irrigated with saline and closed with interrupted 4-0 nylon sutures.  Local infiltration with 0.25% bupivacaine without epinephrine was given,  approximately 8 mL total was used.  A sterile compressive dressing with fingers free was applied.  On deflation of the tourniquet, all fingers immediately pinked.  He was taken to the recovery room for observation in satisfactory condition. He will be discharged home to return to the [facility] in 1 week on [place].          ______________________________ Daryll Brod, M.D.     GK/MEDQ  D:  04/27/2015  T:  04/28/2015  Job:  [id number]

## 2015-05-11 ENCOUNTER — Other Ambulatory Visit: Payer: Self-pay | Admitting: Orthopedic Surgery

## 2015-05-17 ENCOUNTER — Encounter (HOSPITAL_BASED_OUTPATIENT_CLINIC_OR_DEPARTMENT_OTHER): Payer: Self-pay | Admitting: *Deleted

## 2015-05-18 ENCOUNTER — Ambulatory Visit (HOSPITAL_BASED_OUTPATIENT_CLINIC_OR_DEPARTMENT_OTHER)
Admission: RE | Admit: 2015-05-18 | Discharge: 2015-05-18 | Disposition: A | Discharge status: Home / Self Care | Payer: BLUE CROSS/BLUE SHIELD | Source: Ambulatory Visit | Attending: Orthopedic Surgery | Admitting: Orthopedic Surgery

## 2015-05-18 ENCOUNTER — Encounter (HOSPITAL_BASED_OUTPATIENT_CLINIC_OR_DEPARTMENT_OTHER)
Admission: RE | Disposition: A | Discharge status: Home / Self Care | Payer: Self-pay | Source: Ambulatory Visit | Attending: Orthopedic Surgery

## 2015-05-18 ENCOUNTER — Ambulatory Visit (HOSPITAL_BASED_OUTPATIENT_CLINIC_OR_DEPARTMENT_OTHER): Payer: BLUE CROSS/BLUE SHIELD | Admitting: Certified Registered"

## 2015-05-18 ENCOUNTER — Encounter (HOSPITAL_BASED_OUTPATIENT_CLINIC_OR_DEPARTMENT_OTHER): Payer: Self-pay | Admitting: Orthopedic Surgery

## 2015-05-18 DIAGNOSIS — M65331 Trigger finger, right middle finger: Secondary | ICD-10-CM | POA: Insufficient documentation

## 2015-05-18 DIAGNOSIS — Z79899 Other long term (current) drug therapy: Secondary | ICD-10-CM | POA: Insufficient documentation

## 2015-05-18 DIAGNOSIS — R9431 Abnormal electrocardiogram [ECG] [EKG]: Secondary | ICD-10-CM | POA: Diagnosis not present

## 2015-05-18 DIAGNOSIS — Z6841 Body Mass Index (BMI) 40.0 and over, adult: Secondary | ICD-10-CM | POA: Insufficient documentation

## 2015-05-18 DIAGNOSIS — G5601 Carpal tunnel syndrome, right upper limb: Secondary | ICD-10-CM | POA: Diagnosis not present

## 2015-05-18 DIAGNOSIS — Z9989 Dependence on other enabling machines and devices: Secondary | ICD-10-CM | POA: Insufficient documentation

## 2015-05-18 DIAGNOSIS — G4730 Sleep apnea, unspecified: Secondary | ICD-10-CM | POA: Diagnosis not present

## 2015-05-18 DIAGNOSIS — N3281 Overactive bladder: Secondary | ICD-10-CM | POA: Diagnosis not present

## 2015-05-18 DIAGNOSIS — M1990 Unspecified osteoarthritis, unspecified site: Secondary | ICD-10-CM | POA: Diagnosis not present

## 2015-05-18 DIAGNOSIS — E039 Hypothyroidism, unspecified: Secondary | ICD-10-CM | POA: Diagnosis not present

## 2015-05-18 DIAGNOSIS — Z791 Long term (current) use of non-steroidal anti-inflammatories (NSAID): Secondary | ICD-10-CM | POA: Insufficient documentation

## 2015-05-18 DIAGNOSIS — G5602 Carpal tunnel syndrome, left upper limb: Secondary | ICD-10-CM | POA: Insufficient documentation

## 2015-05-18 DIAGNOSIS — I4891 Unspecified atrial fibrillation: Secondary | ICD-10-CM | POA: Insufficient documentation

## 2015-05-18 DIAGNOSIS — M65341 Trigger finger, right ring finger: Secondary | ICD-10-CM | POA: Diagnosis not present

## 2015-05-18 DIAGNOSIS — K219 Gastro-esophageal reflux disease without esophagitis: Secondary | ICD-10-CM | POA: Insufficient documentation

## 2015-05-18 HISTORY — PX: CARPAL TUNNEL RELEASE: SHX101

## 2015-05-18 LAB — POCT HEMOGLOBIN-HEMACUE: Hemoglobin: 16 g/dL (ref 13.0–17.0)

## 2015-05-18 SURGERY — CARPAL TUNNEL RELEASE
Anesthesia: Monitor Anesthesia Care | Laterality: Left

## 2015-05-18 MED ORDER — GLYCOPYRROLATE 0.2 MG/ML IJ SOLN: 0.2000 mg | Freq: Once | INTRAMUSCULAR | Status: DC | PRN

## 2015-05-18 MED ORDER — OXYCODONE HCL 5 MG/5ML PO SOLN: 5.0000 mg | Freq: Once | ORAL | Status: AC | PRN

## 2015-05-18 MED ORDER — BUPIVACAINE HCL (PF) 0.25 % IJ SOLN
INTRAMUSCULAR | Status: AC
  Filled 2015-05-18: qty 120

## 2015-05-18 MED ORDER — CHLORHEXIDINE GLUCONATE 4 % EX LIQD: 60.0000 mL | Freq: Once | CUTANEOUS | Status: DC

## 2015-05-18 MED ORDER — HYDROCODONE-ACETAMINOPHEN 5-325 MG PO TABS: 1.0000 | ORAL_TABLET | Freq: Four times a day (QID) | ORAL | Status: DC | PRN

## 2015-05-18 MED ORDER — ONDANSETRON HCL 4 MG/2ML IJ SOLN: 4.0000 mg | Freq: Four times a day (QID) | INTRAMUSCULAR | Status: DC | PRN

## 2015-05-18 MED ORDER — DEXTROSE 5 % IV SOLN
3.0000 g | INTRAVENOUS | Status: AC
  Administered 2015-05-18: 3 g via INTRAVENOUS

## 2015-05-18 MED ORDER — 0.9 % SODIUM CHLORIDE (POUR BTL) OPTIME
TOPICAL | Status: DC | PRN
  Administered 2015-05-18: 200 mL

## 2015-05-18 MED ORDER — PROPOFOL 500 MG/50ML IV EMUL
INTRAVENOUS | Status: AC
  Filled 2015-05-18: qty 50

## 2015-05-18 MED ORDER — PROPOFOL 10 MG/ML IV BOLUS
INTRAVENOUS | Status: DC | PRN
  Administered 2015-05-18: 30 mg via INTRAVENOUS

## 2015-05-18 MED ORDER — MIDAZOLAM HCL 2 MG/2ML IJ SOLN
INTRAMUSCULAR | Status: AC
  Filled 2015-05-18: qty 2

## 2015-05-18 MED ORDER — LIDOCAINE HCL (PF) 0.5 % IJ SOLN
INTRAMUSCULAR | Status: DC | PRN
  Administered 2015-05-18: 30 mL via INTRAVENOUS

## 2015-05-18 MED ORDER — LIDOCAINE HCL (CARDIAC) 20 MG/ML IV SOLN
INTRAVENOUS | Status: DC | PRN
  Administered 2015-05-18: 25 mg via INTRAVENOUS

## 2015-05-18 MED ORDER — FENTANYL CITRATE (PF) 100 MCG/2ML IJ SOLN
INTRAMUSCULAR | Status: AC
  Filled 2015-05-18: qty 4

## 2015-05-18 MED ORDER — CEFAZOLIN SODIUM 1-5 GM-% IV SOLN
INTRAVENOUS | Status: AC
  Filled 2015-05-18: qty 50

## 2015-05-18 MED ORDER — MIDAZOLAM HCL 2 MG/2ML IJ SOLN
1.0000 mg | INTRAMUSCULAR | Status: DC | PRN
  Administered 2015-05-18: 2 mg via INTRAVENOUS

## 2015-05-18 MED ORDER — PROPOFOL INFUSION 10 MG/ML OPTIME
INTRAVENOUS | Status: DC | PRN
  Administered 2015-05-18: 100 ug/kg/min via INTRAVENOUS

## 2015-05-18 MED ORDER — ONDANSETRON HCL 4 MG/2ML IJ SOLN
INTRAMUSCULAR | Status: DC | PRN
  Administered 2015-05-18: 4 mg via INTRAVENOUS

## 2015-05-18 MED ORDER — CEFAZOLIN SODIUM-DEXTROSE 2-3 GM-% IV SOLR
INTRAVENOUS | Status: AC
  Filled 2015-05-18: qty 50

## 2015-05-18 MED ORDER — OXYCODONE HCL 5 MG PO TABS
5.0000 mg | ORAL_TABLET | Freq: Once | ORAL | Status: AC | PRN
  Administered 2015-05-18: 5 mg via ORAL

## 2015-05-18 MED ORDER — FENTANYL CITRATE (PF) 100 MCG/2ML IJ SOLN
25.0000 ug | INTRAMUSCULAR | Status: DC | PRN
Start: 2015-05-18 — End: 2015-05-18

## 2015-05-18 MED ORDER — BUPIVACAINE HCL (PF) 0.25 % IJ SOLN
INTRAMUSCULAR | Status: DC | PRN
  Administered 2015-05-18: 7 mL

## 2015-05-18 MED ORDER — BUPIVACAINE HCL (PF) 0.25 % IJ SOLN
INTRAMUSCULAR | Status: AC
  Filled 2015-05-18: qty 30

## 2015-05-18 MED ORDER — FENTANYL CITRATE (PF) 100 MCG/2ML IJ SOLN
50.0000 ug | INTRAMUSCULAR | Status: DC | PRN
  Administered 2015-05-18 (×2): 50 ug via INTRAVENOUS

## 2015-05-18 MED ORDER — OXYCODONE HCL 5 MG PO TABS
ORAL_TABLET | ORAL | Status: AC
  Filled 2015-05-18: qty 1

## 2015-05-18 MED ORDER — DEXTROSE 5 % IV SOLN: 3.0000 g | INTRAVENOUS | Status: DC

## 2015-05-18 MED ORDER — LACTATED RINGERS IV SOLN
INTRAVENOUS | Status: DC
  Administered 2015-05-18: 09:00:00 via INTRAVENOUS

## 2015-05-18 SURGICAL SUPPLY — 38 items
BLADE SURG 15 STRL LF DISP TIS (BLADE) ×1 IMPLANT
BLADE SURG 15 STRL SS (BLADE) ×1
BNDG COHESIVE 3X5 TAN STRL LF (GAUZE/BANDAGES/DRESSINGS) ×2 IMPLANT
BNDG ESMARK 4X9 LF (GAUZE/BANDAGES/DRESSINGS) IMPLANT
BNDG GAUZE ELAST 4 BULKY (GAUZE/BANDAGES/DRESSINGS) ×2 IMPLANT
CHLORAPREP W/TINT 26ML (MISCELLANEOUS) ×2 IMPLANT
CORDS BIPOLAR (ELECTRODE) ×2 IMPLANT
COVER BACK TABLE 60X90IN (DRAPES) ×2 IMPLANT
COVER MAYO STAND STRL (DRAPES) ×2 IMPLANT
CUFF TOURNIQUET SINGLE 18IN (TOURNIQUET CUFF) ×2 IMPLANT
DRAPE EXTREMITY T 121X128X90 (DRAPE) ×2 IMPLANT
DRAPE SURG 17X23 STRL (DRAPES) ×2 IMPLANT
DRSG PAD ABDOMINAL 8X10 ST (GAUZE/BANDAGES/DRESSINGS) ×2 IMPLANT
GAUZE SPONGE 4X4 12PLY STRL (GAUZE/BANDAGES/DRESSINGS) ×2 IMPLANT
GAUZE XEROFORM 1X8 LF (GAUZE/BANDAGES/DRESSINGS) ×2 IMPLANT
GLOVE BIOGEL PI IND STRL 7.0 (GLOVE) ×2 IMPLANT
GLOVE BIOGEL PI IND STRL 8 (GLOVE) ×1 IMPLANT
GLOVE BIOGEL PI IND STRL 8.5 (GLOVE) ×1 IMPLANT
GLOVE BIOGEL PI INDICATOR 7.0 (GLOVE) ×2
GLOVE BIOGEL PI INDICATOR 8 (GLOVE) ×1
GLOVE BIOGEL PI INDICATOR 8.5 (GLOVE) ×1
GLOVE SURG ORTHO 8.0 STRL STRW (GLOVE) ×2 IMPLANT
GLOVE SURG SS PI 6.5 STRL IVOR (GLOVE) ×2 IMPLANT
GLOVE SURG SS PI 7.5 STRL IVOR (GLOVE) ×2 IMPLANT
GOWN STRL REUS W/ TWL LRG LVL3 (GOWN DISPOSABLE) ×1 IMPLANT
GOWN STRL REUS W/ TWL XL LVL3 (GOWN DISPOSABLE) ×1 IMPLANT
GOWN STRL REUS W/TWL LRG LVL3 (GOWN DISPOSABLE) ×1
GOWN STRL REUS W/TWL XL LVL3 (GOWN DISPOSABLE) ×3 IMPLANT
NEEDLE PRECISIONGLIDE 27X1.5 (NEEDLE) ×2 IMPLANT
NS IRRIG 1000ML POUR BTL (IV SOLUTION) ×2 IMPLANT
PACK BASIN DAY SURGERY FS (CUSTOM PROCEDURE TRAY) ×2 IMPLANT
STOCKINETTE 4X48 STRL (DRAPES) ×2 IMPLANT
SUT ETHILON 4 0 PS 2 18 (SUTURE) ×2 IMPLANT
SUT VICRYL 4-0 PS2 18IN ABS (SUTURE) IMPLANT
SYR BULB 3OZ (MISCELLANEOUS) ×2 IMPLANT
SYR CONTROL 10ML LL (SYRINGE) IMPLANT
TOWEL OR 17X24 6PK STRL BLUE (TOWEL DISPOSABLE) ×2 IMPLANT
UNDERPAD 30X30 (UNDERPADS AND DIAPERS) ×2 IMPLANT

## 2015-05-18 NOTE — Interval H&P Note (Signed)
History and Physical Interval Note:  05/18/2015 7:46 AM  Roberto Sanford  has presented today for surgery, with the diagnosis of left carpal tunnel syndrome  The various methods of treatment have been discussed with the patient and family. After consideration of risks, benefits and other options for treatment, the patient has consented to  Procedure(s): LEFT CARPAL TUNNEL RELEASE (Left) as a surgical intervention .  The patient's history has been reviewed, patient examined, no change in status, stable for surgery.  I have reviewed the patient's chart and labs.  Questions were answered to the patient's satisfaction.     [name]

## 2015-05-18 NOTE — Anesthesia Preprocedure Evaluation (Signed)
Anesthesia Evaluation  Patient identified by MRN, date of birth, ID band Patient awake    Reviewed: Allergy & Precautions, NPO status , Patient's Chart, lab work & pertinent test results  Airway Mallampati: II   Neck ROM: full    Dental   Pulmonary sleep apnea and Continuous Positive Airway Pressure Ventilation ,  breath sounds clear to auscultation        Cardiovascular negative cardio ROS  Rhythm:regular Rate:Normal     Neuro/Psych Anxiety  Neuromuscular disease    GI/Hepatic GERD-  ,  Endo/Other  Hypothyroidism Morbid obesity  Renal/GU      Musculoskeletal   Abdominal   Peds  Hematology   Anesthesia Other Findings   Reproductive/Obstetrics                             Anesthesia Physical Anesthesia Plan  ASA: II  Anesthesia Plan: MAC and Bier Block   Post-op Pain Management:    Induction: Intravenous  Airway Management Planned: Simple Face Mask  Additional Equipment:   Intra-op Plan:   Post-operative Plan:   Informed Consent: I have reviewed the patients History and Physical, chart, labs and discussed the procedure including the risks, benefits and alternatives for the proposed anesthesia with the patient or authorized representative who has indicated his/her understanding and acceptance.     Plan Discussed with: CRNA, Anesthesiologist and Surgeon  Anesthesia Plan Comments:         Anesthesia Quick Evaluation

## 2015-05-18 NOTE — Transfer of Care (Signed)
Immediate Anesthesia Transfer of Care Note  Patient: Roberto Sanford  Procedure(s) Performed: Procedure(s): LEFT CARPAL TUNNEL RELEASE (Left)  Patient Location: PACU  Anesthesia Type:MAC and Regional  Level of Consciousness: awake, alert , oriented and patient cooperative  Airway & Oxygen Therapy: Patient Spontanous Breathing and Patient connected to face mask oxygen  Post-op Assessment: Report given to RN, Post -op Vital signs reviewed and stable and Patient moving all extremities  Post vital signs: Reviewed and stable  Last Vitals:  Filed Vitals:   05/18/15 0915  BP: 141/85  Pulse: 58  Temp: 36.7 C  Resp: 18    Complications: No apparent anesthesia complications

## 2015-05-18 NOTE — Discharge Instructions (Addendum)
[facility] Instructions Hand Surgery  Wound Care: Keep your hand elevated above the level of your heart.  Do not allow it to dangle by your side.  Keep the dressing dry and do not remove it unless your doctor advises you to do so.  He will usually change it at the time of your post-op visit.  Moving your fingers is advised to stimulate circulation but will depend on the site of your surgery.  If you have a splint applied, your doctor will advise you regarding movement.  Activity: Do not drive or operate machinery today.  Rest today and then you may return to your normal activity and work as indicated by your physician.  Diet:  Drink liquids today or eat a light diet.  You may resume a regular diet tomorrow.    General expectations: Pain for two to three days. Fingers may become slightly swollen.  Call your doctor if any of the following occur: Severe pain not relieved by pain medication. Elevated temperature. Dressing soaked with blood. Inability to move fingers. White or bluish color to fingers.   Post Anesthesia Home Care Instructions  Activity: Get plenty of rest for the remainder of the day. A responsible adult should stay with you for 24 hours following the procedure.  For the next 24 hours, DO NOT: -Drive a car -Paediatric nurse -Drink alcoholic beverages -Take any medication unless instructed by your physician -Make any legal decisions or sign important papers.  Meals: Start with liquid foods such as gelatin or soup. Progress to regular foods as tolerated. Avoid greasy, spicy, heavy foods. If nausea and/or vomiting occur, drink only clear liquids until the nausea and/or vomiting subsides. Call your physician if vomiting continues.  Special Instructions/Symptoms: Your throat may feel dry or sore from the anesthesia or the breathing tube placed in your throat during surgery. If this causes discomfort, gargle with warm salt water. The discomfort should disappear within 24  hours.  If you had a scopolamine patch placed behind your ear for the management of post- operative nausea and/or vomiting:  1. The medication in the patch is effective for 72 hours, after which it should be removed.  Wrap patch in a tissue and discard in the trash. Wash hands thoroughly with soap and water. 2. You may remove the patch earlier than 72 hours if you experience unpleasant side effects which may include dry mouth, dizziness or visual disturbances. 3. Avoid touching the patch. Wash your hands with soap and water after contact with the patch.   Call your surgeon if you experience:   1.  Fever over 101.0. 2.  Inability to urinate. 3.  Nausea and/or vomiting. 4.  Extreme swelling or bruising at the surgical site. 5.  Continued bleeding from the incision. 6.  Increased pain, redness or drainage from the incision. 7.  Problems related to your pain medication. 8. Any change in color, movement and/or sensation 9. Any problems and/or concernsCall your surgeon if you experience:

## 2015-05-18 NOTE — H&P (View-Only) (Signed)
Roberto Sanford is a 59 year-old right-hand dominant male who has not been seen in years.  He is complaining of catching of his right ring and right middle fingers. This has been going on for six months.  He has prior history of carpal tunnel syndrome which I saw him for multiple years ago.  He has not had any further treatment to this. He recalls no treatment of injury to his hand, no history of injury to his neck.  He has history of thyroid problems, arthritis,  he is prediabetic.  There is family history of diabetes and arthritis.  He complains of a constant, moderate to severe, sharp burning type pain with a feeling of numbness awakening him at night.  He states this is gradually getting worse.  Stretching seems to help. He is on Celebrex.    ALLERGIES:    None.  MEDICATIONS:     Glucosamine and chondroitin sulfate, fish oil, pantoprazole, Celebrex, Vesicare, levothyroxine, vitamin B6, and amitriptyline.    SURGICAL HISTORY:      Hernia repair and knee surgery.  FAMILY MEDICAL HISTORY:     Positive for diabetes, arthritis.  SOCIAL HISTORY:     He does not smoke or drink.  He is a Administrator for [employer].    REVIEW OF SYSTEMS:    Negative 14 points. Roberto Sanford is an 59 y.o. male.   Chief Complaint: CTS right hand and trigger fingers right middle and ring fingers HPI: see above  Past Medical History  Diagnosis Date  . Sleep apnea     uses CPAP nightly  . Anxiety   . Arthritis   . GERD (gastroesophageal reflux disease)   . Overactive bladder     takes vesicare and elavil  . Hypothyroidism     Past Surgical History  Procedure Laterality Date  . Hernia repair    . Knee arthroscopy Left     History reviewed. No pertinent family history. Social History:  reports that he has never smoked. He does not have any smokeless tobacco history on file. He reports that he does not drink alcohol or use illicit drugs.  Allergies: No Known Allergies  No prescriptions prior to admission    No  results found for this or any previous visit (from the past 48 hour(s)).  No results found.   Pertinent items are noted in HPI.  Height 5\' 8"  (1.727 m), weight 124.739 kg (275 lb).  General appearance: alert, cooperative and appears stated age Head: Normocephalic, without obvious abnormality Neck: no JVD Resp: clear to auscultation bilaterally Cardio: regular rate and rhythm, S1, S2 normal, no murmur, click, rub or gallop GI: soft, non-tender; bowel sounds normal; no masses,  no organomegaly Extremities: numbness right hand catching middle and ring fingers right Pulses: 2+ and symmetric Skin: Skin color, texture, turgor normal. No rashes or lesions Neurologic: Grossly normal Incision/Wound: na  Assessment/Plan NERVE STUDIES:   Nerve conductions were performed today revealing carpal tunnel syndrome bilaterally with motor delay of 4.2 on the left no acute distress 4.1 right;  sensory delay of 2.8 left no acute distress 2.6 right;  amplitude diminution 24 left and 23.7 right.  DIAGNOSIS:      Stenosing tenosynovitis right middle, right ring fingers.  Bilateral carpal tunnel syndrome.    RECOMMENDATIONS/PLAN:    We discussed possibility of injection of these with him.  He has deferred this and would like to proceed to surgical intervention.  The pre, peri and postoperative course were discussed along  with the risks and complications.  The patient is aware there is no guarantee with the surgery, possibility of infection, recurrence, injury to arteries, nerves, tendons, incomplete relief of symptoms and dystrophy.  He is tentatively scheduled for  release A-1 pulley right middle, right ring finger, and carpal tunnel release right hand.  Roberto Sanford 04/27/2015, 6:39 AM

## 2015-05-18 NOTE — Op Note (Signed)
Dictation Number [id number]

## 2015-05-18 NOTE — Anesthesia Postprocedure Evaluation (Signed)
Anesthesia Post Note  Patient: Roberto Sanford  Procedure(s) Performed: Procedure(s) (LRB): LEFT CARPAL TUNNEL RELEASE (Left)  Anesthesia type: MAC and Beir block  Patient location: PACU  Post pain: Pain level controlled and Adequate analgesia  Post assessment: Post-op Vital signs reviewed, Patient's Cardiovascular Status Stable and Respiratory Function Stable  Last Vitals:  Filed Vitals:   05/18/15 1019  BP:   Pulse: 64  Temp:   Resp: 18    Post vital signs: Reviewed and stable  Level of consciousness: awake, alert  and oriented  Complications: No apparent anesthesia complications

## 2015-05-18 NOTE — Brief Op Note (Signed)
05/18/2015  10:16 AM  PATIENT:  Roberto Sanford  59 y.o. male  PRE-OPERATIVE DIAGNOSIS:  left carpal tunnel syndrome  POST-OPERATIVE DIAGNOSIS:  left carpal tunnel syndrome  PROCEDURE:  Procedure(s): LEFT CARPAL TUNNEL RELEASE (Left)  SURGEON:  Surgeon(s) and Role:    * Daryll Brod, MD - Primary  PHYSICIAN ASSISTANT:   ASSISTANTS: none   ANESTHESIA:   local and regional  EBL:  Total I/O In: 800 [I.V.:800] Out: -   BLOOD ADMINISTERED:none  DRAINS: none   LOCAL MEDICATIONS USED:  BUPIVICAINE   SPECIMEN:  No Specimen  DISPOSITION OF SPECIMEN:  N/A  COUNTS:  YES  TOURNIQUET:   Total Tourniquet Time Documented: Forearm (Left) - 82 minutes Total: Forearm (Left) - 82 minutes   DICTATION: .Other Dictation: Dictation Number [id number]  PLAN OF CARE: Discharge to home after PACU  PATIENT DISPOSITION:  PACU - hemodynamically stable.

## 2015-05-18 NOTE — Anesthesia Procedure Notes (Signed)
Procedure Name: MAC Date/Time: 05/18/2015 9:36 AM Performed by: Baxter Flattery Pre-anesthesia Checklist: Patient identified, Emergency Drugs available, Suction available, Patient being monitored and Timeout performed Patient Re-evaluated:Patient Re-evaluated prior to inductionOxygen Delivery Method: Simple face mask Preoxygenation: Pre-oxygenation with 100% oxygen Intubation Type: IV induction Dental Injury: Teeth and Oropharynx as per pre-operative assessment

## 2015-05-19 ENCOUNTER — Encounter (HOSPITAL_BASED_OUTPATIENT_CLINIC_OR_DEPARTMENT_OTHER): Payer: Self-pay | Admitting: Orthopedic Surgery

## 2015-05-19 NOTE — Op Note (Signed)
[name], Roberto Sanford                ACCOUNT NO.:  [id number]  MEDICAL RECORD NO.:  [id number]  LOCATION:                                FACILITY:  [facility]  PHYSICIAN:  Daryll Brod, M.D.       DATE OF BIRTH:  [birth year]  DATE OF PROCEDURE:  05/18/2015 DATE OF DISCHARGE:  05/18/2015                              OPERATIVE REPORT   PREOPERATIVE DIAGNOSIS:  Carpal tunnel syndrome, left hand.  POSTOPERATIVE DIAGNOSIS:  Carpal tunnel syndrome, left hand.  OPERATION:  Decompression of left median nerve.  SURGEON:  Daryll Brod, MD.  ANESTHESIA:  Forearm-based IV regional with local infiltration.  ANESTHESIOLOGIST:  Albertha Ghee, MD.  HISTORY:  The patient is a 59 year old male with a history of bilateral carpal tunnel syndrome.  He has undergone release of his right side.  He is admitted now for release to the left.  Pre, peri, and postoperative course have been discussed along with risks and complications.  He is aware that there is no guarantee with the surgery, possibility of infection, recurrence of injury to arteries, nerves, tendons, incomplete relief of symptoms, dystrophy.  In preoperative area, the patient is seen, the extremity marked by both patient and surgeon.  Antibiotic given.  PROCEDURE IN DETAIL:  The patient was brought to the operating room, where forearm-based IV regional anesthetic was carried out without difficulty.  He was prepped using ChloraPrep, supine position, left arm free.  A 3-minute dry time was allowed.  Time-out taken, confirming the patient and procedure.  A longitudinal incision was made in the left palm.  He had some feeling.  A local infiltration with 0.25% bupivacaine without epinephrine was given, approximately 7 mL was used.  The wound was deepened.  The palmar fascia was split.  Bleeders electrocauterized with bipolar.  Retractors placed.  The flexor tendon to the ring and little finger was identified to the ulnar side on median nerve.   After placement of retractors, the carpal retinaculum was incised with sharp dissection.  Right angle and Sewall retractor were placed between skin and forearm fascia.  The brachial fascia was then released for approximately 3 cm proximal to the wrist crease under direct vision. Canal was explored.  Area of compression to the nerve was apparent. Motor branch entered into muscle distally.  The wound was copiously irrigated with saline and the skin closed with interrupted 4-0 nylon sutures.  Sterile compressive dressing with the fingers free was applied.  On deflation of the tourniquet, all fingers immediately pinked.  He was taken to the recovery room for observation in satisfactory condition.  He will be discharged home to return to [place] in 1 week on [place].          ______________________________ Daryll Brod, M.D.     GK/MEDQ  D:  05/18/2015  T:  05/19/2015  Job:  [id number]

## 2016-04-25 DIAGNOSIS — Z86010 Personal history of colonic polyps: Secondary | ICD-10-CM | POA: Insufficient documentation

## 2016-04-25 DIAGNOSIS — Z860101 Personal history of adenomatous and serrated colon polyps: Secondary | ICD-10-CM | POA: Insufficient documentation

## 2017-09-12 DIAGNOSIS — M7550 Bursitis of unspecified shoulder: Secondary | ICD-10-CM | POA: Insufficient documentation

## 2017-11-18 DIAGNOSIS — M75121 Complete rotator cuff tear or rupture of right shoulder, not specified as traumatic: Secondary | ICD-10-CM | POA: Insufficient documentation

## 2021-04-26 ENCOUNTER — Ambulatory Visit (INDEPENDENT_AMBULATORY_CARE_PROVIDER_SITE_OTHER): Payer: BC Managed Care – PPO | Admitting: Internal Medicine

## 2021-04-26 ENCOUNTER — Other Ambulatory Visit: Payer: Self-pay

## 2021-04-26 ENCOUNTER — Encounter: Payer: Self-pay | Admitting: Internal Medicine

## 2021-04-26 VITALS — BP 130/80 | HR 59 | Ht 68.0 in | Wt 278.8 lb

## 2021-04-26 DIAGNOSIS — I4891 Unspecified atrial fibrillation: Secondary | ICD-10-CM | POA: Insufficient documentation

## 2021-04-26 DIAGNOSIS — R0600 Dyspnea, unspecified: Secondary | ICD-10-CM | POA: Diagnosis not present

## 2021-04-26 DIAGNOSIS — R350 Frequency of micturition: Secondary | ICD-10-CM | POA: Insufficient documentation

## 2021-04-26 DIAGNOSIS — G4733 Obstructive sleep apnea (adult) (pediatric): Secondary | ICD-10-CM | POA: Insufficient documentation

## 2021-04-26 DIAGNOSIS — R0609 Other forms of dyspnea: Secondary | ICD-10-CM

## 2021-04-26 DIAGNOSIS — K219 Gastro-esophageal reflux disease without esophagitis: Secondary | ICD-10-CM | POA: Insufficient documentation

## 2021-04-26 DIAGNOSIS — E079 Disorder of thyroid, unspecified: Secondary | ICD-10-CM | POA: Insufficient documentation

## 2021-04-26 NOTE — Patient Instructions (Signed)
ICD-10-CM   1. Dyspnea on exertion  R06.00    Shortness of breath cause is not determined  Pulmonary reason such as asthma or pulmonary fibrosis need to be ruled out  Other common reasons include atrial fibrillation, stiff heart muscle otherwise call diastolic dysfunction, weight issues, physical deconditioning issues  Plan  - Do full pulmonary function test -Do FeNO exhaled nitric oxide test -Do high-resolution CT chest supine and prone  Follow-up - 15-minute telephone visit with nurse practitioner to discuss the results (next 1-3 weeks)  -If these results are inconclusive -trial of pulmonary rehabilitation versus getting a pulmonary stress test then

## 2021-04-26 NOTE — Progress Notes (Addendum)
OV 04/26/2021  Subjective:  Patient ID: Roberto Sanford, male , DOB: Nov 09, 1956 , age 65 y.o. , MRN: [id number] , ADDRESS: [address] [place] [address] PCP Deland Pretty, MD Patient Care Team: Deland Pretty, MD as PCP - General (Internal Medicine)  This Provider for this visit: Treatment Team:  Attending Provider: Brand Males, MD    04/26/2021 -   Chief Complaint  Patient presents with  . Consult    Pt is being referred by PCP due to dyspnea. Pt has had problems with SOB x6 months. States that the SOB sometimes just comes and goes and states that he becomes SOB walking up stairs. Pt also has an occ cough.     HPI Roberto Sanford 65 y.o. -presents with his wife.  He is a retired Administrator.  He reports insidious onset of shortness of breath the last 6 months present on exertion relieved by rest.  Sometimes even sitting at home he has to take a deep breath.  When he climbs stairs he gets short of breath there is no associated chest pain.  When he does things outside the house he gets short of breath.  There is no associated wheezing but he does have a chronic cough.  The cough occasionally wakes him up at night but mostly in the daytime.  Symptoms are mild to moderate intensity.  There is no radiation.  He saw Dr. Claudie Leach [facility] cardiology and has had carotid ultrasound, cardiac stress test and echo in April 2022 and all are normal according to report.  I do not have these results with him.  He specifically could not tell me if he had diastolic dysfunction.  Previously he is known to have polycythemia [see below] but he does not recollect whether his recent lab work is abnormal.  He is waiting for those results though.  There is no prior history of asthma or allergies.  He is known to have sleep apnea and he snores.  He is on CPAP.  This is managed by primary care physician.  No recent imaging results available for my visualization.   Simple office walk 185 feet x  3  laps goal with forehead probe 04/26/2021   O2 used ra  Number laps completed 3  Comments about pace avg  Resting Pulse Ox/HR 100% and 59/min  Final Pulse Ox/HR 100% and 111/min  Desaturated </= 88% no  Desaturated <= 3% points no  Got Tachycardic >/= 90/min yes  Symptoms at end of test Mild dyspnea  Miscellaneous comments  a fib     Results for CHRISTOP, HIPPERT (MRN [id number]) as of 04/26/2021 09:10  Ref. Range 10/19/2008 08:02 08/23/2011 09:12 04/27/2015 08:51 05/18/2015 09:23  Hemoglobin Latest Ref Range: 13.0 - 17.0 g/dL 15.3 13.1 13.9 16.0    STress EK Aug 2014 Negative for ischemia GXT demostrating good exercise tolerance with the patinet exercing to a 10 met workload and HR of 171, representing 95% of predicted maximun. No diagnostic ST changes of ischemia. Rare PVC's during recovery.   CT Chest data  No results found.    PFT  No flowsheet data found.     has a past medical history of Anxiety, Arthritis, GERD (gastroesophageal reflux disease), Hypothyroidism, Overactive bladder, and Sleep apnea.   reports that he has never smoked. He has never used smokeless tobacco.  Past Surgical History:  Procedure Laterality Date  . CARPAL TUNNEL RELEASE Right 04/27/2015   Procedure: CARPAL TUNNEL RELEASE  RIGHT ;  Surgeon: Daryll Brod, MD;  Location: [facility];  Service: Orthopedics;  Laterality: Right;  . CARPAL TUNNEL RELEASE Left 05/18/2015   Procedure: LEFT CARPAL TUNNEL RELEASE;  Surgeon: Daryll Brod, MD;  Location: [facility];  Service: Orthopedics;  Laterality: Left;  . HERNIA REPAIR    . KNEE ARTHROSCOPY Left   . TRIGGER FINGER RELEASE Right 04/27/2015   Procedure: RELEASE A-1 PULLEY PULLEY RIGHT MIDDLE FINGER, RELEASE A-1 PULLEY RIGHT RING FINGER ;  Surgeon: Daryll Brod, MD;  Location: [facility];  Service: Orthopedics;  Laterality: Right;    No Known Allergies  Immunization History  Administered Date(s) Administered  .  Hepatitis A, Adult 07/21/2017, 02/21/2018  . PFIZER(Purple Top)SARS-COV-2 Vaccination 03/31/2020, 04/21/2020, 01/10/2021  . Zoster Recombinat (Shingrix) 07/21/2017, 09/27/2017    No family history on file.   Current Outpatient Medications:  .  amitriptyline (ELAVIL) 25 MG tablet, Take 25 mg by mouth at bedtime., Disp: , Rfl:  .  aspirin 81 MG chewable tablet, Chew by mouth., Disp: , Rfl:  .  Cholecalciferol 25 MCG (1000 UT) capsule, Take by mouth., Disp: , Rfl:  .  levothyroxine (SYNTHROID, LEVOTHROID) 150 MCG tablet, Take 150 mcg by mouth daily before breakfast., Disp: , Rfl:  .  Multiple Vitamin (MULTIVITAMIN WITH MINERALS) TABS tablet, Take 1 tablet by mouth daily., Disp: , Rfl:  .  TOVIAZ 8 MG TB24 tablet, Take 8 mg by mouth daily., Disp: , Rfl:  .  zolpidem (AMBIEN CR) 12.5 MG CR tablet, Take 12.5 mg by mouth at bedtime as needed for sleep., Disp: , Rfl:       Objective:   Vitals:   04/26/21 0910  BP: 130/80  Pulse: (!) 59  SpO2: 100%  Weight: 278 lb 12.8 oz (126.5 kg)  Height: 5' 8"  (1.727 m)    Estimated body mass index is 42.39 kg/m as calculated from the following:   Height as of this encounter: 5' 8"  (1.727 m).   Weight as of this encounter: 278 lb 12.8 oz (126.5 kg).  @WEIGHTCHANGE @  [employer]   04/26/21 0910  Weight: 278 lb 12.8 oz (126.5 kg)     Physical Exam  General Appearance:    Alert, cooperative, no distress, appears stated age - yes , Deconditioned looking - yes , OBESE  - yes, Sitting on Wheelchair -  no  Head:    Normocephalic, without obvious abnormality, atraumatic  Eyes:    PERRL, conjunctiva/corneas clear,  Ears:    Normal TM's and external ear canals, both ears  Nose:   Nares normal, septum midline, mucosa normal, no drainage    or sinus tenderness. OXYGEN ON  - no . Patient is @ ra   Throat:   Lips, mucosa, and tongue normal; teeth and gums normal. Cyanosis on lips - no  Neck:   Supple, symmetrical, trachea midline, no adenopathy;     thyroid:  no enlargement/tenderness/nodules; no carotid   bruit or JVD  Back:     Symmetric, no curvature, ROM normal, no CVA tenderness  Lungs:     Distress - no , Wheeze no, Barrell Chest - no, Purse lip breathing - no, Crackles - no   Chest Wall:    No tenderness or deformity.    Heart:    Regular rate and rhythm, S1 and S2 normal, no rub   or gallop, Murmur - no  Breast Exam:    NOT DONE  Abdomen:  Soft, non-tender, bowel sounds active all four quadrants,    no masses, no organomegaly. Visceral obesity - yes  Genitalia:   NOT DONE  Rectal:   NOT DONE  Extremities:   Extremities - normal, Has Cane - no, Clubbing - no, Edema - no  Pulses:   2+ and symmetric all extremities  Skin:   Stigmata of Connective Tissue Disease - no  Lymph nodes:   Cervical, supraclavicular, and axillary nodes normal  Psychiatric:  Neurologic:   Pleasant - yes, Anxious - no, Flat affect - maybe  CAm-ICU - neg, Alert and Oriented x 3 - yes, Moves all 4s - yes, Speech - normal, Cognition - intact           Assessment:       ICD-10-CM   1. Dyspnea on exertion  R06.00 CT Chest High Resolution    Nitric oxide    Pulmonary function test       Plan:     Patient Instructions     ICD-10-CM   1. Dyspnea on exertion  R06.00    Shortness of breath cause is not determined  Pulmonary reason such as asthma or pulmonary fibrosis need to be ruled out  Other common reasons include atrial fibrillation, stiff heart muscle otherwise call diastolic dysfunction, weight issues, physical deconditioning issues  Plan  - Do full pulmonary function test -Do FeNO exhaled nitric oxide test -Do high-resolution CT chest supine and prone  Follow-up - 15-minute telephone visit with nurse practitioner to discuss the results (next 1-3 weeks)  -If these results are inconclusive -trial of pulmonary rehabilitation versus getting a pulmonary stress test then     SIGNATURE    Dr. Brand Males, M.D., F.C.C.P,   Pulmonary and Critical Care Medicine Staff Physician, [place] Director - Interstitial Lung Disease  Program  [facility] [place] [facility] [place], [place], [address]  Pager: [phone], If no answer or between  15:00h - 7:00h: call [phone] Telephone: [phone]  9:37 AM 04/26/2021

## 2021-05-08 ENCOUNTER — Other Ambulatory Visit (HOSPITAL_COMMUNITY)
Admission: RE | Admit: 2021-05-08 | Discharge: 2021-05-08 | Disposition: A | Discharge status: Home / Self Care | Payer: BC Managed Care – PPO | Source: Ambulatory Visit | Attending: Internal Medicine | Admitting: Internal Medicine

## 2021-05-08 ENCOUNTER — Inpatient Hospital Stay: Admission: RE | Admit: 2021-05-08 | Payer: BC Managed Care – PPO | Source: Ambulatory Visit

## 2021-05-08 DIAGNOSIS — Z01812 Encounter for preprocedural laboratory examination: Secondary | ICD-10-CM | POA: Insufficient documentation

## 2021-05-08 DIAGNOSIS — Z20822 Contact with and (suspected) exposure to covid-19: Secondary | ICD-10-CM | POA: Diagnosis not present

## 2021-05-08 LAB — SARS CORONAVIRUS 2 (TAT 6-24 HRS): SARS Coronavirus 2: NEGATIVE

## 2021-05-11 ENCOUNTER — Other Ambulatory Visit: Payer: Self-pay | Admitting: Primary Care

## 2021-05-11 ENCOUNTER — Ambulatory Visit (INDEPENDENT_AMBULATORY_CARE_PROVIDER_SITE_OTHER): Payer: BC Managed Care – PPO | Admitting: Internal Medicine

## 2021-05-11 ENCOUNTER — Other Ambulatory Visit: Payer: Self-pay

## 2021-05-11 DIAGNOSIS — R0600 Dyspnea, unspecified: Secondary | ICD-10-CM

## 2021-05-11 DIAGNOSIS — R0609 Other forms of dyspnea: Secondary | ICD-10-CM

## 2021-05-11 LAB — PULMONARY FUNCTION TEST
DL/VA % pred: 139 %
DL/VA: 5.85 ml/min/mmHg/L
DLCO cor % pred: 137 %
DLCO cor: 34.88 ml/min/mmHg
DLCO unc % pred: 137 %
DLCO unc: 34.88 ml/min/mmHg
FEF 25-75 Post: 3.47 L/sec
FEF 25-75 Pre: 2.96 L/sec
FEF2575-%Change-Post: 17 %
FEF2575-%Pred-Post: 133 %
FEF2575-%Pred-Pre: 114 %
FEV1-%Change-Post: 3 %
FEV1-%Pred-Post: 89 %
FEV1-%Pred-Pre: 86 %
FEV1-Post: 2.89 L
FEV1-Pre: 2.78 L
FEV1FVC-%Change-Post: 5 %
FEV1FVC-%Pred-Pre: 108 %
FEV6-%Change-Post: 0 %
FEV6-%Pred-Post: 82 %
FEV6-%Pred-Pre: 83 %
FEV6-Post: 3.36 L
FEV6-Pre: 3.39 L
FEV6FVC-%Change-Post: 0 %
FEV6FVC-%Pred-Post: 105 %
FEV6FVC-%Pred-Pre: 104 %
FVC-%Change-Post: -1 %
FVC-%Pred-Post: 78 %
FVC-%Pred-Pre: 79 %
FVC-Post: 3.37 L
FVC-Pre: 3.41 L
Post FEV1/FVC ratio: 86 %
Post FEV6/FVC ratio: 100 %
Pre FEV1/FVC ratio: 81 %
Pre FEV6/FVC Ratio: 99 %
RV % pred: 103 %
RV: 2.29 L
TLC % pred: 91 %
TLC: 6.04 L

## 2021-05-11 LAB — NITRIC OXIDE: Nitric Oxide: 24

## 2021-05-11 NOTE — Progress Notes (Signed)
PFT done today. 

## 2021-05-15 ENCOUNTER — Other Ambulatory Visit: Payer: Self-pay

## 2021-05-15 ENCOUNTER — Ambulatory Visit (INDEPENDENT_AMBULATORY_CARE_PROVIDER_SITE_OTHER)
Admission: RE | Admit: 2021-05-15 | Discharge: 2021-05-15 | Disposition: A | Discharge status: Home / Self Care | Payer: BC Managed Care – PPO | Source: Ambulatory Visit | Attending: Internal Medicine | Admitting: Internal Medicine

## 2021-05-15 DIAGNOSIS — R0600 Dyspnea, unspecified: Secondary | ICD-10-CM | POA: Diagnosis not present

## 2021-05-15 DIAGNOSIS — R0609 Other forms of dyspnea: Secondary | ICD-10-CM

## 2021-05-16 ENCOUNTER — Telehealth: Payer: Self-pay | Admitting: Internal Medicine

## 2021-05-16 NOTE — Telephone Encounter (Signed)
Roberto Sanford are seeing Roberto Sanford on 6/10 for followu;. No clear cut reason for dysnea in below. I think I ordered ONO but do not have results with me right now 05/16/2021 . You could consider cardiac stress test if not done due to his coronary artery calcification  Thanks  MR  xxxx    Lab Results  Component Value Date   NITRICOXIDE 24 05/11/2021     CT Chest High Resolution  Result Date: 05/15/2021 CLINICAL DATA:  Shortness of breath on exertion. Dry cough. History of COVID in 2021. EXAM: CT CHEST WITHOUT CONTRAST TECHNIQUE: Multidetector CT imaging of the chest was performed following the standard protocol without intravenous contrast. High resolution imaging of the lungs, as well as inspiratory and expiratory imaging, was performed. COMPARISON:  None. FINDINGS: Cardiovascular: Atherosclerotic calcification of the aorta, aortic valve and coronary arteries. Heart is enlarged. No pericardial effusion. Mediastinum/Nodes: No pathologically enlarged mediastinal or axillary lymph nodes. Hilar regions are difficult to definitively evaluate without IV contrast. Esophagus is grossly unremarkable. Lungs/Pleura: There is diffuse septal thickening. Negative for subpleural reticulation, traction bronchiectasis/bronchiolectasis, ground-glass, architectural distortion or honeycombing. Scattered calcified granulomas. A few scattered pulmonary nodules measure up to 5 mm in the left lower lobe (3/109). No pleural fluid. Airway is unremarkable. Minimal air trapping. Upper Abdomen: Visualized portion of the liver is unremarkable. Stones in the gallbladder. Adrenal glands and visualized portions of the kidneys, spleen, pancreas, stomach and bowel are grossly unremarkable. Musculoskeletal: Degenerative changes in the spine. No worrisome lytic or sclerotic lesions. IMPRESSION: 1. No evidence of fibrotic interstitial lung disease. 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 2. Pulmonary edema. 3.  Cholelithiasis. 4. Aortic atherosclerosis (ICD10-I70.0). Coronary artery calcification. Electronically Signed   By: Lorin Picket M.D.   On: 05/15/2021 12:01   PFT Results Latest Ref Rng & Units 05/11/2021  FVC-Pre L 3.41  FVC-Predicted Pre % 79  FVC-Post L 3.37  FVC-Predicted Post % 78  Pre FEV1/FVC % % 81  Post FEV1/FCV % % 86  FEV1-Pre L 2.78  FEV1-Predicted Pre % 86  FEV1-Post L 2.89  DLCO uncorrected ml/min/mmHg 34.88  DLCO UNC% % 137  DLCO corrected ml/min/mmHg 34.88  DLCO COR %Predicted % 137  DLVA Predicted % 139  TLC L 6.04  TLC % Predicted % 91  RV % Predicted % 103

## 2021-05-18 ENCOUNTER — Ambulatory Visit (INDEPENDENT_AMBULATORY_CARE_PROVIDER_SITE_OTHER): Payer: BC Managed Care – PPO | Admitting: Primary Care

## 2021-05-18 ENCOUNTER — Other Ambulatory Visit: Payer: Self-pay

## 2021-05-18 ENCOUNTER — Encounter: Payer: Self-pay | Admitting: Primary Care

## 2021-05-18 DIAGNOSIS — R0609 Other forms of dyspnea: Secondary | ICD-10-CM

## 2021-05-18 DIAGNOSIS — E669 Obesity, unspecified: Secondary | ICD-10-CM

## 2021-05-18 DIAGNOSIS — I2510 Atherosclerotic heart disease of native coronary artery without angina pectoris: Secondary | ICD-10-CM | POA: Diagnosis not present

## 2021-05-18 DIAGNOSIS — R0600 Dyspnea, unspecified: Secondary | ICD-10-CM

## 2021-05-18 MED ORDER — BREO ELLIPTA 100-25 MCG/INH IN AEPB: 1.0000 | INHALATION_SPRAY | Freq: Every day | RESPIRATORY_TRACT | 0 refills | Status: DC

## 2021-05-18 NOTE — Patient Instructions (Addendum)
-   PFTs showed minima restriction without BD response. FENO 24 - CT imaging showed no evidence of ILD, minimal air trapping indicative of small airway disease - Trial BREO 100 one puff everyday in the morning for 2-4 weeks (rinse mouth after use) - Recommend weight loss efforts 20-40 lbs - Get 20-55min cardiovascular exercise 3-5 times a week  Follow-up: 2-4 week follow up with Eustaquio Maize NP (televisit or in office)

## 2021-05-18 NOTE — Progress Notes (Addendum)
Virtual Visit via Telephone Note  I connected with UMBERTO PAVEK on 05/18/21 at  9:30 AM EDT by telephone and verified that I am speaking with the correct person using two identifiers.  Location: Patient: Home Provider: Office    I discussed the limitations, risks, security and privacy concerns of performing an evaluation and management service by telephone and the availability of in person appointments. I also discussed with the patient that there may be a patient responsible charge related to this service. The patient expressed understanding and agreed to proceed.   History of Present Illness: 65 year old male, never smoked. Past medical history significant for OSA, A. fib, GERD, thyroid disease, obesity.  Patient of Dr. Chase Caller, seen for initial consult on 04/26/2021 for dyspnea on exertion.  Patient was ordered for PFTs, HRCT.  05/18/2021 Patient contacted today for televisit to review testing. He continues to have some dyspnea symptoms. He joined gym 6 months ago when he noticed. He has a dry cough. He has had full cardiac testing with [facility] without acute findings, we are requesting those records for review. He wears CPAP every night.  Denies chest tightness or wheezing.    Observations/Objective:  - Able to speak in full sentences; no overt shortness of breath of breath or cough  HRCT on 05/15/2021 showed no evidence of fibrotic interstitial lung disease.  Minimal air trapping indicative of small airway disease.  Pulmonary edema.  Atherosclerosis calcification of aorta, aortic valve and coronary arteries.  Heart is enlarged.  05/11/21 PFT - FVC 3.37 (78%), FEV1 2.89 (89%), ratio 86, TLC 91%, DLCOunc 137%  05/11/21 FENO 24  Assessment and Plan:  Dyspnea on exertion: - PFTs showed minima restriction without BD response. FENO 24 - CT imaging in June 2022 showed no evidence of ILD, minimal air trapping indicative of small airway disease - Trial BROE 100 one puff daily    OSA: - Wearing cpap at night  - He will discuss with his sleep doctor whether or not he needs ONO   CAD: - CT imaging showed pulmonary edema, atherosclerosis calcification of aorta, aortic valve and coronary arteries  - Follows with cardiology, Dr. Claudie Leach with [facility]  - He has had a stress test done, echocardiogram, Ultrasound of carotid arteries (requesting results)  Follow Up Instructions:   2-4 weeks with Eustaquio Maize NP  I discussed the assessment and treatment plan with the patient. The patient was provided an opportunity to ask questions and all were answered. The patient agreed with the plan and demonstrated an understanding of the instructions.   The patient was advised to call back or seek an in-person evaluation if the symptoms worsen or if the condition fails to improve as anticipated.  I provided 27 minutes of non-face-to-face time during this encounter.   Martyn Ehrich, NP

## 2021-06-15 ENCOUNTER — Other Ambulatory Visit: Payer: Self-pay | Admitting: Primary Care

## 2021-06-15 NOTE — Telephone Encounter (Signed)
Patient requesting refill for Breo. Is it ok to refill? Patient is scheduled with you on 7/21.

## 2021-06-28 ENCOUNTER — Other Ambulatory Visit (HOSPITAL_COMMUNITY): Payer: Self-pay

## 2021-06-28 ENCOUNTER — Telehealth: Payer: Self-pay | Admitting: Primary Care

## 2021-06-28 ENCOUNTER — Other Ambulatory Visit: Payer: Self-pay

## 2021-06-28 ENCOUNTER — Encounter: Payer: Self-pay | Admitting: Primary Care

## 2021-06-28 ENCOUNTER — Ambulatory Visit: Payer: BC Managed Care – PPO | Admitting: Primary Care

## 2021-06-28 DIAGNOSIS — R0602 Shortness of breath: Secondary | ICD-10-CM | POA: Diagnosis not present

## 2021-06-28 NOTE — Assessment & Plan Note (Signed)
-   Former marijuana use. His cough improved on Breo 169mcg daily. PFTs showed no evidence of obstructive lung disease, he had mild restriction without BD response. CT imaging showed evidence of minimal air trapping, along with pulmonary edema and coronary artery calcifications. I suspect most of his dyspnea is cardiac related. He will be seeing a second cardiology in September. We will check to see what ICS/LABA inhaler is formulary on his plan and send in RX.

## 2021-06-28 NOTE — Patient Instructions (Addendum)
Nice to see you today [name]  CT imaging in June showed no evidence of fibrotic lung disease, Minimal air trapping indicative of small airway disease (COPD or asthma). You also had pulmonary edema , atherosclerosis calcifications of aorta and enlarged heart. I think it is a good idea you have a second cardiology consult. Your breathing test did not show that you have COPD, you had minimal restriction likely due to weight.   If cough and shortness of breath were some improved with BREO recommend we continue similar inhaler that is on your plan, we will check with our pharmacy team.  We may need to re-run this once you get medicare in September  Follow-up: 6 months with Dr. Chase Sanford or sooner if needed

## 2021-06-28 NOTE — Telephone Encounter (Signed)
Tried some of the options and by far the cheapest choice would be Advair Diskus 100-41mcg. It would cost the patient around $13.19 at this time. Can't guarantee that Advair will remain to be the cheapest once they switch to Medicare however.

## 2021-06-28 NOTE — Progress Notes (Signed)
@Patient  ID: Delrae Rend, male    DOB: 10/16/1956, 65 y.o.   MRN: [id number]  Chief Complaint  Patient presents with   Follow-up    Doing ok, breathing is the same    Referring provider: Deland Pretty, MD  HPI: 65 year old male, never smoked. Past medical history significant for OSA, A. fib, GERD, thyroid disease, obesity.  Patient of Dr. Chase Caller, seen for initial consult on 04/26/2021 for dyspnea on exertion.  Patient was ordered for PFTs, HRCT.  Previous [facility] pulmonary encounter:  05/18/2021 Patient contacted today for televisit to review testing. He continues to have some dyspnea symptoms. He joined gym 6 months ago when he noticed. He has a dry cough. He has had full cardiac testing with [facility] without acute findings, we are requesting those records for review. He wears CPAP every night.  Denies chest tightness or wheezing.   06/28/2021- Interim hx  Patient presents today for 4-6 week follow-up. He is doing alright. Breathing is about the same. He does reports that his cough improved on Breo 100. He also noticed a slight improvement in his breathing. BREO is not affordable currently. He will be transitioning to medicare in September. He did tell me today that he used to smoke marijuana for approximately 9 years, quit 32 years ago. His PCP wants him to get a second opinion from cardiology.   TESTING:  HRCT on 05/15/2021 showed no evidence of fibrotic interstitial lung disease.  Minimal air trapping indicative of small airway disease.  Pulmonary edema.  Atherosclerosis calcification of aorta, aortic valve and coronary arteries.  Heart is enlarged.   05/11/21 PFT - FVC 3.37 (78%), FEV1 2.89 (89%), ratio 86, TLC 91%, DLCOunc 137%  05/11/21 FENO- 24   No Known Allergies  Immunization History  Administered Date(s) Administered   Hepatitis A, Adult 07/21/2017, 02/21/2018   PFIZER(Purple Top)SARS-COV-2 Vaccination 03/31/2020, 04/21/2020, 01/10/2021   Zoster Recombinat  (Shingrix) 07/21/2017, 09/27/2017    Past Medical History:  Diagnosis Date   Anxiety    Arthritis    GERD (gastroesophageal reflux disease)    Hypothyroidism    Overactive bladder    takes vesicare and elavil   Sleep apnea    uses CPAP nightly    Tobacco History: Social History   Tobacco Use  Smoking Status Never  Smokeless Tobacco Never   Counseling given: Yes   Outpatient Medications Prior to Visit  Medication Sig Dispense Refill   amitriptyline (ELAVIL) 25 MG tablet Take 25 mg by mouth at bedtime.     aspirin 81 MG chewable tablet Chew by mouth.     BREO ELLIPTA 100-25 MCG/INH AEPB TAKE 1 PUFF BY MOUTH EVERY DAY 60 each 0   Cholecalciferol 25 MCG (1000 UT) capsule Take by mouth.     levothyroxine (SYNTHROID, LEVOTHROID) 150 MCG tablet Take 150 mcg by mouth daily before breakfast.     meloxicam (MOBIC) 15 MG tablet Take 1 tablet by mouth daily.     Multiple Vitamin (MULTIVITAMIN WITH MINERALS) TABS tablet Take 1 tablet by mouth daily.     TOVIAZ 8 MG TB24 tablet Take 8 mg by mouth daily.     zolpidem (AMBIEN CR) 12.5 MG CR tablet Take 12.5 mg by mouth at bedtime as needed for sleep.     No facility-administered medications prior to visit.    Review of Systems  Review of Systems  Constitutional: Negative.   Respiratory: Negative.  Negative for cough and shortness of breath.  Physical Exam  BP 132/88 (BP Location: Left Wrist, Cuff Size: Normal)   Pulse 71   Temp (!) 97.3 F (36.3 C) (Oral)   Ht 5\' 10"  (1.778 m)   Wt 278 lb 6.4 oz (126.3 kg)   SpO2 97%   BMI 39.95 kg/m  Physical Exam Constitutional:      Appearance: Normal appearance.  HENT:     Head: Normocephalic and atraumatic.     Mouth/Throat:     Mouth: Mucous membranes are moist.     Pharynx: Oropharynx is clear.  Cardiovascular:     Rate and Rhythm: Normal rate and regular rhythm.  Pulmonary:     Effort: Pulmonary effort is normal.     Breath sounds: Normal breath sounds. No wheezing,  rhonchi or rales.  Skin:    General: Skin is warm and dry.  Neurological:     General: No focal deficit present.     Mental Status: He is alert and oriented to person, place, and time. Mental status is at baseline.  Psychiatric:        Mood and Affect: Mood normal.        Behavior: Behavior normal.        Thought Content: Thought content normal.        Judgment: Judgment normal.     Lab Results:  CBC    Component Value Date/Time   HGB 16.0 05/18/2015 0923    BMET No results found for: NA, K, CL, CO2, GLUCOSE, BUN, CREATININE, CALCIUM, GFRNONAA, GFRAA  BNP No results found for: BNP  ProBNP No results found for: PROBNP  Imaging: No results found.   Assessment & Plan:   Shortness of breath - Former marijuana use. His cough improved on Breo 170mcg daily. PFTs showed no evidence of obstructive lung disease, he had mild restriction without BD response. CT imaging showed evidence of minimal air trapping, along with pulmonary edema and coronary artery calcifications. I suspect most of his dyspnea is cardiac related. He will be seeing a second cardiology in September. We will check to see what ICS/LABA inhaler is formulary on his plan and send in RX.     Martyn Ehrich, NP 06/28/2021

## 2021-06-28 NOTE — Telephone Encounter (Signed)
Can you look into what low dose ICS/LABA is covered on patients plan? Memory Dance was too expensive. He will be going on medicare in September

## 2021-06-29 MED ORDER — FLUTICASONE-SALMETEROL 100-50 MCG/ACT IN AEPB: 1.0000 | INHALATION_SPRAY | Freq: Two times a day (BID) | RESPIRATORY_TRACT | 3 refills | Status: DC

## 2021-06-29 NOTE — Telephone Encounter (Signed)
That's fine, I appreciate it!!

## 2021-06-29 NOTE — Addendum Note (Signed)
Addended by: Martyn Ehrich on: 06/29/2021 08:52 AM   Modules accepted: Orders

## 2021-07-12 NOTE — Progress Notes (Signed)
Cardiology Office Note:   Date:  07/13/2021  NAME:  Roberto Sanford    MRN: [id number] DOB:  07-23-1956   PCP:  Deland Pretty, MD  Cardiologist:  None  Electrophysiologist:  None   Referring MD: Deland Pretty, MD   Chief Complaint  Patient presents with   Shortness of Breath   History of Present Illness:   Roberto Sanford is a 65 y.o. male with a hx of Afib, OSA, coronary calcifications who is being seen today for the evaluation of SOB at the request of Deland Pretty, MD. he reports he has been evaluated for shortness of breath.  He was seen at [facility] for shortness of breath.  Underwent an echocardiogram and stress test per his report were unremarkable.  We need to obtain the records.  He reports exertion can get him short of breath but can also occur with sitting.  He was seen by pulmonary.  Had PFTs.  They were really unremarkable.  There was increased diffusion capacity and question of diastolic heart failure.  He underwent high-resolution chest CT.  This showed pulmonary edema.  Symptoms appear to be consistent with diastolic heart failure.  He is on no heart failure medications.  Primary care does question Jardiance which is a very good medication.  He needs more information before restart this.  He reports that his Advair does help his shortness of breath.  He has no lower extremity edema.  He is morbidly obese with a BMI of 42.  He has sleep apnea.  He has had persistent atrial fibrillation for 5 years.  He reports that he is never attempted rhythm control.  No murmurs on examination.  Does have elevated JVD.  Has trace edema.  CT scan also showed coronary calcifications.  No symptoms of angina.  No chest pain.  No chest pressure.  He has never had a heart attack or stroke.  No family history of heart disease.  He is on aspirin 81 mg daily.  Primary care did start Crestor which is appropriate.  He is a never smoker.  He does not drink alcohol.  No drug use is reported.  He is  retired Administrator.  He has sleep apnea and uses his machine.  Symptoms appear to have improved with Advair.  Still getting exertionally short of breath.   TSH 0.84  Problem List Afib, persistent -CHADSVASC=1 (dCHF) OSA Coronary calcifications on CT Pulmonary edema on Chest CT HLD -Total cholesterol 151, HDL 35, LDL 102, triglycerides 68 6. HTN 7. Obesity -BMI 42  Past Medical History: Past Medical History:  Diagnosis Date   Anxiety    Arthritis    Atrial fibrillation (HCC)    GERD (gastroesophageal reflux disease)    Hypothyroidism    Overactive bladder    takes vesicare and elavil   Sleep apnea    uses CPAP nightly    Past Surgical History: Past Surgical History:  Procedure Laterality Date   CARPAL TUNNEL RELEASE Right 04/27/2015   Procedure: CARPAL TUNNEL RELEASE RIGHT ;  Surgeon: Daryll Brod, MD;  Location: [place];  Service: Orthopedics;  Laterality: Right;   CARPAL TUNNEL RELEASE Left 05/18/2015   Procedure: LEFT CARPAL TUNNEL RELEASE;  Surgeon: Daryll Brod, MD;  Location: [place] [facility];  Service: Orthopedics;  Laterality: Left;   HERNIA REPAIR     KNEE ARTHROSCOPY Left    TRIGGER FINGER RELEASE Right 04/27/2015   Procedure: RELEASE A-1 PULLEY PULLEY RIGHT MIDDLE FINGER, RELEASE  A-1 PULLEY RIGHT RING FINGER ;  Surgeon: Daryll Brod, MD;  Location: [facility];  Service: Orthopedics;  Laterality: Right;    Current Medications: Current Meds  Medication Sig   amitriptyline (ELAVIL) 25 MG tablet Take 25 mg by mouth at bedtime.   aspirin 81 MG chewable tablet Chew by mouth.   buPROPion (WELLBUTRIN XL) 300 MG 24 hr tablet Take 300 mg by mouth in the morning.   Cholecalciferol 25 MCG (1000 UT) capsule Take by mouth.   fluticasone-salmeterol (ADVAIR DISKUS) 100-50 MCG/ACT AEPB Inhale 1 puff into the lungs 2 (two) times daily.   furosemide (LASIX) 20 MG tablet Take 1 tablet (20 mg total) by mouth daily.   levothyroxine (SYNTHROID,  LEVOTHROID) 150 MCG tablet Take 150 mcg by mouth daily before breakfast.   meloxicam (MOBIC) 15 MG tablet Take 1 tablet by mouth daily.   Multiple Vitamin (MULTIVITAMIN WITH MINERALS) TABS tablet Take 1 tablet by mouth daily.   rosuvastatin (CRESTOR) 10 MG tablet Take 10 mg by mouth daily.   zolpidem (AMBIEN) 10 MG tablet Take 10 mg by mouth as needed.   [DISCONTINUED] zolpidem (AMBIEN CR) 12.5 MG CR tablet Take 12.5 mg by mouth at bedtime as needed for sleep.     Allergies:    Patient has no known allergies.   Social History: Social History   Socioeconomic History   Marital status: Married    Spouse name: Not on file   Number of children: Not on file   Years of education: Not on file   Highest education level: Not on file  Occupational History   Occupation: Truck Geophysicist/field seismologist  Tobacco Use   Smoking status: Never   Smokeless tobacco: Never  Vaping Use   Vaping Use: Never used  Substance and Sexual Activity   Alcohol use: No   Drug use: No   Sexual activity: Not on file  Other Topics Concern   Not on file  Social History Narrative   Not on file   Social Determinants of Health   Financial Resource Strain: Not on file  Food Insecurity: Not on file  Transportation Needs: Not on file  Physical Activity: Not on file  Stress: Not on file  Social Connections: Not on file     Family History: The patient's family history includes Cancer in his father; Diabetes in his mother.  ROS:   All other ROS reviewed and negative. Pertinent positives noted in the HPI.     EKGs/Labs/Other Studies Reviewed:   The following studies were personally reviewed by me today:  EKG:  EKG is ordered today.  The ekg ordered today demonstrates atrial fibrillation heart rate 84, no acute ischemic changes, and was personally reviewed by me.   Recent Labs: No results found for requested labs within last 8760 hours.   Recent Lipid Panel No results found for: CHOL, TRIG, HDL, CHOLHDL, VLDL, LDLCALC,  LDLDIRECT  Physical Exam:   VS:  BP 140/88 (BP Location: Left Arm, Patient Position: Sitting, Cuff Size: Large)   Pulse 89   Ht '5\' 8"'$  (1.727 m)   Wt 275 lb 12.8 oz (125.1 kg)   SpO2 99%   BMI 41.94 kg/m    Wt Readings from Last 3 Encounters:  07/13/21 275 lb 12.8 oz (125.1 kg)  06/28/21 278 lb 6.4 oz (126.3 kg)  04/26/21 278 lb 12.8 oz (126.5 kg)    General: Well nourished, well developed, in no acute distress Head: Atraumatic, normal size  Eyes: PEERLA, EOMI  Neck: Supple, JVD 8 to 10 cm of water Endocrine: No thryomegaly Cardiac: Normal S1, S2; RRR; no murmurs, rubs, or gallops Lungs: Clear to auscultation bilaterally, no wheezing, rhonchi or rales  Abd: Soft, nontender, no hepatomegaly  Ext: No edema, pulses 2+ Musculoskeletal: No deformities, BUE and BLE strength normal and equal Skin: Warm and dry, no rashes   Neuro: Alert and oriented to person, place, time, and situation, CNII-XII grossly intact, no focal deficits  Psych: Normal mood and affect   ASSESSMENT:   Roberto Sanford is a 65 y.o. male who presents for the following: 1. SOB (shortness of breath) on exertion   2. Chronic diastolic heart failure ([place])   3. Coronary artery calcification seen on CAT scan   4. Persistent atrial fibrillation (HCC)   5. Obesity, morbid, BMI 40.0-49.9 (HCC)     PLAN:   1. SOB (shortness of breath) on exertion 2. Chronic diastolic heart failure (HCC) -Clinically appears to be volume up.  Has elevated JVD.  Chest CT with pulmonary edema.  PFTs are normal.  I think the presumptive diagnosis is diastolic heart failure.  We will check a BNP today as well as start him on Lasix 20 mg daily. -We will obtain records from [facility].  He had an echo in June as well as a stress test.  Reported to be normal. -He does need an echocardiogram where I can see the images.  We will start medications and then repeat an echocardiogram in a few months.  Insurance should approve this.  He has had a change in  symptoms. -Primary care does question Jardiance.  I think this medication would be a good idea.  It is quite expensive.  I think we should proceed with the above work-up obtain an echocardiogram and then make decision on Jardiance.  I think this would be reasonable to do in the interim.  We will see him back before we start this.  I do want to confirm his diagnosis of HFpEF before we do start this.  I think a BNP and echocardiogram would do this.  3. Coronary artery calcification seen on CAT scan -Coronary calcification seen on CT scan.  Continue aspirin.  On Crestor 10 mg daily.  Goal LDL less than 70.  4. Persistent Afib -5-year history of atrial fibrillation.  Has never attempted rhythm control.  Given his obesity and OSA I doubt he will ever maintain rhythm without medications.  Weight precludes ablation.  CHA2DS2-VASc is 1.  No need for anticoagulation at this time. -Most recent thyroid test normal.  We will continue with rate control strategy for now.  This could be contribute to his congestive heart failure.  We will need to see an echocardiogram when I see him back to make that determination.  5. Morbid obesity, BMI 42 -If diastolic heart failure is the main issue.  Weight loss is recommended.  He would cure himself if he loses weight.  I have recommended to lose at least 50 pounds.     Disposition: Return in about 4 months (around 11/12/2021).  Medication Adjustments/Labs and Tests Ordered: Current medicines are reviewed at length with the patient today.  Concerns regarding medicines are outlined above.  Orders Placed This Encounter  Procedures   Pro b natriuretic peptide (BNP)[id number] [place])   EKG 12-Lead   ECHOCARDIOGRAM COMPLETE    Meds ordered this encounter  Medications   furosemide (LASIX) 20 MG tablet    Sig: Take 1 tablet (20 mg total)  by mouth daily.    Dispense:  90 tablet    Refill:  3     Patient Instructions  Medication Instructions:    START FUROSEMIDE 20 MG ONCE DAILY  *If you need a refill on your cardiac medications before your next appointment, please call your pharmacy*   Lab Work:  Your physician recommends that you HAVE LAB WORK TODAY  If you have labs (blood work) drawn today and your tests are completely normal, you will receive your results only by: [place] (if you have MyChart) OR A paper copy in the mail If you have any lab test that is abnormal or we need to change your treatment, we will call you to review the results.   Testing/Procedures:  Your physician has requested that you have an echocardiogram. Echocardiography is a painless test that uses sound waves to create images of your heart. It provides your doctor with information about the size and shape of your heart and how well your heart's chambers and valves are working. This procedure takes approximately one hour. There are no restrictions for this procedure. [place] October PRIOR TO FOLLOW UP APPOINTMENT   Follow-Up: At [facility], you and your health needs are our priority.  As part of our continuing mission to provide you with exceptional heart care, we have created designated Provider Care Teams.  These Care Teams include your primary Cardiologist (physician) and Advanced Practice Providers (APPs -  Physician Assistants and Nurse Practitioners) who all work together to provide you with the care you need, when you need it.  We recommend signing up for the patient portal called "MyChart".  Sign up information is provided on this After Visit Summary.  MyChart is used to connect with patients for Virtual Visits (Telemedicine).  Patients are able to view lab/test results, encounter notes, upcoming appointments, etc.  Non-urgent messages can be sent to your provider as well.   To learn more about what you can do with MyChart, go to [email or web address].    Your next appointment:   4 month(s)  The  format for your next appointment:   In Person  Provider:   Eleonore Chiquito, MD   Signed, Addison Naegeli. Audie Box, MD, [place]  [address] [place], [place] [address] [phone]  07/13/2021 10:09 AM

## 2021-07-13 ENCOUNTER — Encounter: Payer: Self-pay | Admitting: Cardiovascular Disease

## 2021-07-13 ENCOUNTER — Ambulatory Visit: Payer: BC Managed Care – PPO | Admitting: Cardiovascular Disease

## 2021-07-13 ENCOUNTER — Other Ambulatory Visit: Payer: Self-pay

## 2021-07-13 VITALS — BP 140/88 | HR 89 | Ht 68.0 in | Wt 275.8 lb

## 2021-07-13 DIAGNOSIS — R0602 Shortness of breath: Secondary | ICD-10-CM

## 2021-07-13 DIAGNOSIS — I5032 Chronic diastolic (congestive) heart failure: Secondary | ICD-10-CM | POA: Diagnosis not present

## 2021-07-13 DIAGNOSIS — I2510 Atherosclerotic heart disease of native coronary artery without angina pectoris: Secondary | ICD-10-CM

## 2021-07-13 DIAGNOSIS — I4819 Other persistent atrial fibrillation: Secondary | ICD-10-CM | POA: Diagnosis not present

## 2021-07-13 MED ORDER — FUROSEMIDE 20 MG PO TABS: 20.0000 mg | ORAL_TABLET | Freq: Every day | ORAL | 3 refills | Status: DC

## 2021-07-13 NOTE — Patient Instructions (Signed)
Medication Instructions:   START FUROSEMIDE 20 MG ONCE DAILY  *If you need a refill on your cardiac medications before your next appointment, please call your pharmacy*   Lab Work:  Your physician recommends that you HAVE LAB WORK TODAY  If you have labs (blood work) drawn today and your tests are completely normal, you will receive your results only by: [place] (if you have MyChart) OR A paper copy in the mail If you have any lab test that is abnormal or we need to change your treatment, we will call you to review the results.   Testing/Procedures:  Your physician has requested that you have an echocardiogram. Echocardiography is a painless test that uses sound waves to create images of your heart. It provides your doctor with information about the size and shape of your heart and how well your heart's chambers and valves are working. This procedure takes approximately one hour. There are no restrictions for this procedure. [place] October PRIOR TO FOLLOW UP APPOINTMENT   Follow-Up: At [facility], you and your health needs are our priority.  As part of our continuing mission to provide you with exceptional heart care, we have created designated Provider Care Teams.  These Care Teams include your primary Cardiologist (physician) and Advanced Practice Providers (APPs -  Physician Assistants and Nurse Practitioners) who all work together to provide you with the care you need, when you need it.  We recommend signing up for the patient portal called "MyChart".  Sign up information is provided on this After Visit Summary.  MyChart is used to connect with patients for Virtual Visits (Telemedicine).  Patients are able to view lab/test results, encounter notes, upcoming appointments, etc.  Non-urgent messages can be sent to your provider as well.   To learn more about what you can do with MyChart, go to [email or web address].    Your next appointment:    4 month(s)  The format for your next appointment:   In Person  Provider:   Eleonore Chiquito, MD

## 2021-07-14 LAB — PRO B NATRIURETIC PEPTIDE: NT-Pro BNP: 1001 pg/mL — ABNORMAL HIGH (ref 0–210)

## 2021-08-15 ENCOUNTER — Ambulatory Visit: Payer: BC Managed Care – PPO | Admitting: Internal Medicine

## 2021-08-15 DIAGNOSIS — M7042 Prepatellar bursitis, left knee: Secondary | ICD-10-CM | POA: Diagnosis not present

## 2021-08-15 DIAGNOSIS — I10 Essential (primary) hypertension: Secondary | ICD-10-CM | POA: Diagnosis not present

## 2021-08-24 ENCOUNTER — Telehealth (HOSPITAL_COMMUNITY): Payer: Self-pay | Admitting: Cardiovascular Disease

## 2021-08-24 NOTE — Telephone Encounter (Signed)
Patient spouse called and Left voicemail to cancel echocardiogram scheduled for 110/19/22 and did not wish to reschedule. Order will be removed from the echo WQ.

## 2021-08-27 NOTE — Telephone Encounter (Signed)
Noted. Thanks.

## 2021-09-26 ENCOUNTER — Other Ambulatory Visit (HOSPITAL_COMMUNITY): Payer: BC Managed Care – PPO

## 2021-10-15 DIAGNOSIS — E871 Hypo-osmolality and hyponatremia: Secondary | ICD-10-CM | POA: Diagnosis not present

## 2021-12-07 ENCOUNTER — Ambulatory Visit: Payer: BC Managed Care – PPO | Admitting: Cardiovascular Disease

## 2021-12-13 DIAGNOSIS — D235 Other benign neoplasm of skin of trunk: Secondary | ICD-10-CM | POA: Diagnosis not present

## 2021-12-13 DIAGNOSIS — D225 Melanocytic nevi of trunk: Secondary | ICD-10-CM | POA: Diagnosis not present

## 2021-12-13 DIAGNOSIS — L905 Scar conditions and fibrosis of skin: Secondary | ICD-10-CM | POA: Diagnosis not present

## 2021-12-13 DIAGNOSIS — L570 Actinic keratosis: Secondary | ICD-10-CM | POA: Diagnosis not present

## 2021-12-13 DIAGNOSIS — D2372 Other benign neoplasm of skin of left lower limb, including hip: Secondary | ICD-10-CM | POA: Diagnosis not present

## 2021-12-13 DIAGNOSIS — L821 Other seborrheic keratosis: Secondary | ICD-10-CM | POA: Diagnosis not present

## 2021-12-13 DIAGNOSIS — L918 Other hypertrophic disorders of the skin: Secondary | ICD-10-CM | POA: Diagnosis not present

## 2021-12-13 DIAGNOSIS — B353 Tinea pedis: Secondary | ICD-10-CM | POA: Diagnosis not present

## 2021-12-13 DIAGNOSIS — L578 Other skin changes due to chronic exposure to nonionizing radiation: Secondary | ICD-10-CM | POA: Diagnosis not present

## 2021-12-13 DIAGNOSIS — D1801 Hemangioma of skin and subcutaneous tissue: Secondary | ICD-10-CM | POA: Diagnosis not present

## 2021-12-20 DIAGNOSIS — M542 Cervicalgia: Secondary | ICD-10-CM | POA: Diagnosis not present

## 2021-12-20 DIAGNOSIS — E039 Hypothyroidism, unspecified: Secondary | ICD-10-CM | POA: Diagnosis not present

## 2021-12-20 DIAGNOSIS — K219 Gastro-esophageal reflux disease without esophagitis: Secondary | ICD-10-CM | POA: Diagnosis not present

## 2021-12-20 DIAGNOSIS — M549 Dorsalgia, unspecified: Secondary | ICD-10-CM | POA: Diagnosis not present

## 2021-12-20 DIAGNOSIS — N289 Disorder of kidney and ureter, unspecified: Secondary | ICD-10-CM | POA: Diagnosis not present

## 2021-12-20 DIAGNOSIS — M79643 Pain in unspecified hand: Secondary | ICD-10-CM | POA: Diagnosis not present

## 2021-12-20 DIAGNOSIS — M1990 Unspecified osteoarthritis, unspecified site: Secondary | ICD-10-CM | POA: Diagnosis not present

## 2022-01-09 DIAGNOSIS — E039 Hypothyroidism, unspecified: Secondary | ICD-10-CM | POA: Diagnosis not present

## 2022-01-09 DIAGNOSIS — M549 Dorsalgia, unspecified: Secondary | ICD-10-CM | POA: Diagnosis not present

## 2022-01-09 DIAGNOSIS — M542 Cervicalgia: Secondary | ICD-10-CM | POA: Diagnosis not present

## 2022-01-09 DIAGNOSIS — K219 Gastro-esophageal reflux disease without esophagitis: Secondary | ICD-10-CM | POA: Diagnosis not present

## 2022-01-09 DIAGNOSIS — N289 Disorder of kidney and ureter, unspecified: Secondary | ICD-10-CM | POA: Diagnosis not present

## 2022-01-09 DIAGNOSIS — M79643 Pain in unspecified hand: Secondary | ICD-10-CM | POA: Diagnosis not present

## 2022-01-09 DIAGNOSIS — M1990 Unspecified osteoarthritis, unspecified site: Secondary | ICD-10-CM | POA: Diagnosis not present

## 2022-02-08 DIAGNOSIS — E039 Hypothyroidism, unspecified: Secondary | ICD-10-CM | POA: Diagnosis not present

## 2022-02-08 DIAGNOSIS — G4733 Obstructive sleep apnea (adult) (pediatric): Secondary | ICD-10-CM | POA: Diagnosis not present

## 2022-02-08 DIAGNOSIS — F5104 Psychophysiologic insomnia: Secondary | ICD-10-CM | POA: Diagnosis not present

## 2022-02-18 ENCOUNTER — Telehealth: Payer: Self-pay | Admitting: Cardiovascular Disease

## 2022-02-18 NOTE — Telephone Encounter (Signed)
Would need to review note from PCP since we do not have pt on any BP meds per our med list. If PCP has been prescribing his BP meds, pt should ideally follow up with them for med adjustments/de-escalations as he loses weight and BP drops. His reported BP was normal although his HR is low and he's not any HR lowering meds. He was supposed to have follow up with Dr Audie Box in December 2022 but he canceled his appt and never rescheduled. HR should continue to be monitored by pt and will need to be evaluated by MD if it remains low. ?

## 2022-02-18 NOTE — Telephone Encounter (Signed)
Called patient, advised that we had received paperwork from Dr.Pharr- however the medications were prescribed by PCP. I contacted patient and advised he should contact them to make adjustments. However did notify him that he was overdue for an appointment with Dr. Audie Box and to call back to get scheduled.  ? ?

## 2022-02-18 NOTE — Telephone Encounter (Signed)
Contacted patient, he was currently at Dr.Franklin's office. He states that he has lost 40lbs in the last 4 months, his BP today was 120/60 and HR has been running in the 40's-50's. This is what they believe is causing his dizziness. After review of note from Dr.O'Neal and our med list, I did not see he was on any medications. The nurse from Dr.Franklin's office and patient advised he was taking Losartan 100 mg daily, and HCTZ 12.5 mg daily. I did not have this on our list, as I was unaware if Dr.O'Neal has prescribed these. They are sending over a note from [place] office to review, they just wanted to know if we had any recommendations of his BP/HR concerns.  ? ? ?

## 2022-02-18 NOTE — Telephone Encounter (Signed)
Dr. Bettye Boeck is calling from medication management requesting this patient be called by a nurse in regards to hypotension and dizziness. He states the patient has lost 40 lbs in the last 4 months with their weight loss program and is now having hypotension because of it. He is requesting the patient be worked in with Dr. Audie Box to be seen this week. You can call him back in regards to this if you would like to discuss further. He states to just ask for him directly if callback is necessary. Please advise.  ?

## 2022-03-07 DIAGNOSIS — F5101 Primary insomnia: Secondary | ICD-10-CM | POA: Diagnosis not present

## 2022-03-07 DIAGNOSIS — E039 Hypothyroidism, unspecified: Secondary | ICD-10-CM | POA: Diagnosis not present

## 2022-03-12 DIAGNOSIS — M1990 Unspecified osteoarthritis, unspecified site: Secondary | ICD-10-CM | POA: Diagnosis not present

## 2022-03-12 DIAGNOSIS — E039 Hypothyroidism, unspecified: Secondary | ICD-10-CM | POA: Diagnosis not present

## 2022-03-12 DIAGNOSIS — M549 Dorsalgia, unspecified: Secondary | ICD-10-CM | POA: Diagnosis not present

## 2022-03-12 DIAGNOSIS — E790 Hyperuricemia without signs of inflammatory arthritis and tophaceous disease: Secondary | ICD-10-CM | POA: Diagnosis not present

## 2022-03-12 DIAGNOSIS — M79643 Pain in unspecified hand: Secondary | ICD-10-CM | POA: Diagnosis not present

## 2022-03-12 DIAGNOSIS — K219 Gastro-esophageal reflux disease without esophagitis: Secondary | ICD-10-CM | POA: Diagnosis not present

## 2022-03-12 DIAGNOSIS — M542 Cervicalgia: Secondary | ICD-10-CM | POA: Diagnosis not present

## 2022-03-12 DIAGNOSIS — N289 Disorder of kidney and ureter, unspecified: Secondary | ICD-10-CM | POA: Diagnosis not present

## 2022-03-18 ENCOUNTER — Telehealth: Payer: Self-pay | Admitting: Cardiovascular Disease

## 2022-03-18 NOTE — Telephone Encounter (Signed)
Dr. Bettye Boeck calling to speak with DOD. ?

## 2022-03-28 DIAGNOSIS — G4733 Obstructive sleep apnea (adult) (pediatric): Secondary | ICD-10-CM | POA: Diagnosis not present

## 2022-04-26 DIAGNOSIS — M25512 Pain in left shoulder: Secondary | ICD-10-CM | POA: Diagnosis not present

## 2022-04-26 DIAGNOSIS — G8929 Other chronic pain: Secondary | ICD-10-CM | POA: Diagnosis not present

## 2022-04-27 DIAGNOSIS — G4733 Obstructive sleep apnea (adult) (pediatric): Secondary | ICD-10-CM | POA: Diagnosis not present

## 2022-05-19 NOTE — Progress Notes (Unsigned)
Cardiology Office Note:   Date:  05/20/2022  NAME:  Roberto Sanford    MRN: [id number] DOB:  [birth year]   PCP:  Deland Pretty, MD  Cardiologist:  None  Electrophysiologist:  None   Referring MD: Deland Pretty, MD   Chief Complaint  Patient presents with   Follow-up   History of Present Illness:   Roberto Sanford is a 66 y.o. male with a hx of dCHF, persistent Afib, obesity who presents for follow-up.  He has lost roughly 80 pounds since her last appointment.  Apparently he is participating in a weight loss trial.  He is no longer requiring Lasix.  He has no symptoms of congestive heart failure.  He reports he can walk several miles per day.  His heart rate was apparently in the 12s and his primary care physician wanted this evaluated.  I did inform him that this is okay.  He denies any symptoms of chest pain or trouble breathing.  He has no symptoms of congestive heart failure.  EKG demonstrates A-fib with heart rate 77.  We did discuss that he now has enough risk factors to merit Eliquis.  He is okay to start this.  He will stop aspirin.  No need for beta-blocker.  Denies any symptoms in office.  Problem List Afib, persistent -CHADSVASC=3 (dCHF, HTN, age) OSA Coronary calcifications on CT -negative MPI Pulmonary edema on Chest CT HLD -Total cholesterol 151, HDL 35, LDL 102, triglycerides 68 6. HTN 7. Obesity -BMI 42  Past Medical History: Past Medical History:  Diagnosis Date   Anxiety    Arrhythmia    Arthritis    Atrial fibrillation (HCC)    GERD (gastroesophageal reflux disease)    Hypothyroidism    Overactive bladder    takes vesicare and elavil   Sleep apnea    uses CPAP nightly    Past Surgical History: Past Surgical History:  Procedure Laterality Date   CARPAL TUNNEL RELEASE Right 04/27/2015   Procedure: CARPAL TUNNEL RELEASE RIGHT ;  Surgeon: Daryll Brod, MD;  Location: [facility];  Service: Orthopedics;  Laterality: Right;   CARPAL TUNNEL  RELEASE Left 05/18/2015   Procedure: LEFT CARPAL TUNNEL RELEASE;  Surgeon: Daryll Brod, MD;  Location: [facility];  Service: Orthopedics;  Laterality: Left;   HERNIA REPAIR     KNEE ARTHROSCOPY Left    TRIGGER FINGER RELEASE Right 04/27/2015   Procedure: RELEASE A-1 PULLEY PULLEY RIGHT MIDDLE FINGER, RELEASE A-1 PULLEY RIGHT RING FINGER ;  Surgeon: Daryll Brod, MD;  Location: [facility];  Service: Orthopedics;  Laterality: Right;    Current Medications: Current Meds  Medication Sig   amitriptyline (ELAVIL) 25 MG tablet Take 25 mg by mouth at bedtime.   apixaban (ELIQUIS) 5 MG TABS tablet Take 1 tablet (5 mg total) by mouth 2 (two) times daily.   Cholecalciferol 25 MCG (1000 UT) capsule Take by mouth.   fluticasone-salmeterol (ADVAIR DISKUS) 100-50 MCG/ACT AEPB Inhale 1 puff into the lungs 2 (two) times daily.   levothyroxine (SYNTHROID, LEVOTHROID) 150 MCG tablet Take 150 mcg by mouth daily before breakfast.   meloxicam (MOBIC) 15 MG tablet Take 1 tablet by mouth daily.   Multiple Vitamin (MULTIVITAMIN WITH MINERALS) TABS tablet Take 1 tablet by mouth daily.   rosuvastatin (CRESTOR) 10 MG tablet Take 10 mg by mouth daily.   TOVIAZ 8 MG TB24 tablet Take 8 mg by mouth daily.   zolpidem (AMBIEN) 10 MG tablet Take 10  mg by mouth as needed.   [DISCONTINUED] aspirin 81 MG chewable tablet Chew by mouth.   [DISCONTINUED] furosemide (LASIX) 20 MG tablet Take 1 tablet (20 mg total) by mouth daily.     Allergies:    Patient has no known allergies.   Social History: Social History   Socioeconomic History   Marital status: Married    Spouse name: Not on file   Number of children: Not on file   Years of education: Not on file   Highest education level: Not on file  Occupational History   Occupation: Truck Geophysicist/field seismologist  Tobacco Use   Smoking status: Never   Smokeless tobacco: Never  Vaping Use   Vaping Use: Never used  Substance and Sexual Activity   Alcohol use: No    Drug use: No   Sexual activity: Not on file  Other Topics Concern   Not on file  Social History Narrative   Not on file   Social Determinants of Health   Financial Resource Strain: Not on file  Food Insecurity: Not on file  Transportation Needs: Not on file  Physical Activity: Not on file  Stress: Not on file  Social Connections: Not on file     Family History: The patient's family history includes Cancer in his father; Diabetes in his mother.  ROS:   All other ROS reviewed and negative. Pertinent positives noted in the HPI.     EKGs/Labs/Other Studies Reviewed:   The following studies were personally reviewed by me today:  EKG:  EKG is ordered today.  The ekg ordered today demonstrates atrial fibrillation heart rate 77, no acute ischemic changes or evidence of infarction, and was personally reviewed by me.   Echo ([place]) 03/2021 EF 60-65%  Nuclear SPECT MPI 03/22/2021 Normal   Recent Labs: 07/13/2021: NT-Pro BNP 1,001   Recent Lipid Panel No results found for: "CHOL", "TRIG", "HDL", "CHOLHDL", "VLDL", "LDLCALC", "LDLDIRECT"  Physical Exam:   VS:  BP 120/84   Pulse 77   Ht '5\' 8"'$  (1.727 m)   Wt 197 lb 6.4 oz (89.5 kg)   SpO2 99%   BMI 30.01 kg/m    Wt Readings from Last 3 Encounters:  05/20/22 197 lb 6.4 oz (89.5 kg)  07/13/21 275 lb 12.8 oz (125.1 kg)  06/28/21 278 lb 6.4 oz (126.3 kg)    General: Well nourished, well developed, in no acute distress Head: Atraumatic, normal size  Eyes: PEERLA, EOMI  Neck: Supple, no JVD Endocrine: No thryomegaly Cardiac: Normal S1, S2; irregular rhythm Lungs: Clear to auscultation bilaterally, no wheezing, rhonchi or rales  Abd: Soft, nontender, no hepatomegaly  Ext: No edema, pulses 2+ Musculoskeletal: No deformities, BUE and BLE strength normal and equal Skin: Warm and dry, no rashes   Neuro: Alert and oriented to person, place, time, and situation, CNII-XII grossly intact, no focal deficits  Psych: Normal  mood and affect   ASSESSMENT:   Roberto Sanford is a 66 y.o. male who presents for the following: 1. SOB (shortness of breath) on exertion   2. Chronic diastolic heart failure (HCC)   3. Persistent atrial fibrillation ([place])   4. Acquired thrombophilia (HCC)   5. Obesity, morbid, BMI 40.0-49.9 (HCC)     PLAN:   1. SOB (shortness of breath) on exertion 2. Chronic diastolic heart failure (HCC) -Symptoms of diastolic heart failure have resolved with nearly 80 pound weight loss.  He is taking Lasix only as needed.  Not requiring any recently.  Echocardiogram  to [facility] last year was normal.  I see no need for further testing.  Weight loss help to treat this immensely.  3. Persistent atrial fibrillation ([place]) 4. Acquired thrombophilia ([place]) -Longstanding history of atrial fibrillation.  Rate controlled on no medications.  CHA2DS2-VASc score is now 3.  Would recommend to stop aspirin and start Eliquis 5 mg twice daily.  He is okay to do this.  He will see [place] back in 6 months.  Echo with normal LVEF.  Stress test to [facility] was also normal.  5. Obesity, morbid, BMI 40.0-49.9 ([place]) -He is lost 80 pounds.  Symptoms have been largely treated with weight loss management.  He will see [place] back in 6 months.  Disposition: Return in about 6 months (around 11/19/2022).  Medication Adjustments/Labs and Tests Ordered: Current medicines are reviewed at length with the patient today.  Concerns regarding medicines are outlined above.  Orders Placed This Encounter  Procedures   EKG 12-Lead   Meds ordered this encounter  Medications   apixaban (ELIQUIS) 5 MG TABS tablet    Sig: Take 1 tablet (5 mg total) by mouth 2 (two) times daily.    Dispense:  60 tablet    Refill:  3   furosemide (LASIX) 20 MG tablet    Sig: Take 1 tablet (20 mg total) by mouth as needed.    Dispense:  90 tablet    Refill:  3    Patient Instructions  Medication Instructions:  STOP Aspirin START Eliquis 5 mg  twice daily  Continue with Lasix as needed  *If you need a refill on your cardiac medications before your next appointment, please call your pharmacy*   Follow-Up: At [facility], you and your health needs are our priority.  As part of our continuing mission to provide you with exceptional heart care, we have created designated Provider Care Teams.  These Care Teams include your primary Cardiologist (physician) and Advanced Practice Providers (APPs -  Physician Assistants and Nurse Practitioners) who all work together to provide you with the care you need, when you need it.  We recommend signing up for the patient portal called "MyChart".  Sign up information is provided on this After Visit Summary.  MyChart is used to connect with patients for Virtual Visits (Telemedicine).  Patients are able to view lab/test results, encounter notes, upcoming appointments, etc.  Non-urgent messages can be sent to your provider as well.   To learn more about what you can do with MyChart, go to [email or web address].    Your next appointment:   6 month(s)  The format for your next appointment:   In Person  Provider:   Eleonore Chiquito, MD             Time Spent with Patient: I have spent a total of 25 minutes with patient reviewing hospital notes, telemetry, EKGs, labs and examining the patient as well as establishing an assessment and plan that was discussed with the patient.  > 50% of time was spent in direct patient care.  Signed, Addison Naegeli. Audie Box, MD, [place]  [address], [place], [place] [address] [phone]  05/20/2022 9:05 AM

## 2022-05-20 ENCOUNTER — Encounter: Payer: Self-pay | Admitting: Cardiovascular Disease

## 2022-05-20 ENCOUNTER — Ambulatory Visit (INDEPENDENT_AMBULATORY_CARE_PROVIDER_SITE_OTHER): Payer: PPO | Admitting: Cardiovascular Disease

## 2022-05-20 VITALS — BP 120/84 | HR 77 | Ht 68.0 in | Wt 197.4 lb

## 2022-05-20 DIAGNOSIS — D6869 Other thrombophilia: Secondary | ICD-10-CM

## 2022-05-20 DIAGNOSIS — R0602 Shortness of breath: Secondary | ICD-10-CM

## 2022-05-20 DIAGNOSIS — I5032 Chronic diastolic (congestive) heart failure: Secondary | ICD-10-CM

## 2022-05-20 DIAGNOSIS — I4819 Other persistent atrial fibrillation: Secondary | ICD-10-CM | POA: Diagnosis not present

## 2022-05-20 MED ORDER — FUROSEMIDE 20 MG PO TABS: 20.0000 mg | ORAL_TABLET | ORAL | 3 refills | Status: DC | PRN

## 2022-05-20 MED ORDER — APIXABAN 5 MG PO TABS: 5.0000 mg | ORAL_TABLET | Freq: Two times a day (BID) | ORAL | 3 refills | Status: DC

## 2022-05-20 NOTE — Patient Instructions (Signed)
Medication Instructions:  STOP Aspirin START Eliquis 5 mg twice daily  Continue with Lasix as needed  *If you need a refill on your cardiac medications before your next appointment, please call your pharmacy*   Follow-Up: At [facility], you and your health needs are our priority.  As part of our continuing mission to provide you with exceptional heart care, we have created designated Provider Care Teams.  These Care Teams include your primary Cardiologist (physician) and Advanced Practice Providers (APPs -  Physician Assistants and Nurse Practitioners) who all work together to provide you with the care you need, when you need it.  We recommend signing up for the patient portal called "MyChart".  Sign up information is provided on this After Visit Summary.  MyChart is used to connect with patients for Virtual Visits (Telemedicine).  Patients are able to view lab/test results, encounter notes, upcoming appointments, etc.  Non-urgent messages can be sent to your provider as well.   To learn more about what you can do with MyChart, go to [email or web address].    Your next appointment:   6 month(s)  The format for your next appointment:   In Person  Provider:   Eleonore Chiquito, MD

## 2022-05-25 DIAGNOSIS — M25512 Pain in left shoulder: Secondary | ICD-10-CM | POA: Diagnosis not present

## 2022-05-25 DIAGNOSIS — M75102 Unspecified rotator cuff tear or rupture of left shoulder, not specified as traumatic: Secondary | ICD-10-CM | POA: Diagnosis not present

## 2022-05-28 DIAGNOSIS — G4733 Obstructive sleep apnea (adult) (pediatric): Secondary | ICD-10-CM | POA: Diagnosis not present

## 2022-05-29 DIAGNOSIS — M1990 Unspecified osteoarthritis, unspecified site: Secondary | ICD-10-CM | POA: Diagnosis not present

## 2022-05-29 DIAGNOSIS — Z125 Encounter for screening for malignant neoplasm of prostate: Secondary | ICD-10-CM | POA: Diagnosis not present

## 2022-05-29 DIAGNOSIS — I10 Essential (primary) hypertension: Secondary | ICD-10-CM | POA: Diagnosis not present

## 2022-05-29 DIAGNOSIS — K219 Gastro-esophageal reflux disease without esophagitis: Secondary | ICD-10-CM | POA: Diagnosis not present

## 2022-05-29 DIAGNOSIS — N289 Disorder of kidney and ureter, unspecified: Secondary | ICD-10-CM | POA: Diagnosis not present

## 2022-05-29 DIAGNOSIS — M549 Dorsalgia, unspecified: Secondary | ICD-10-CM | POA: Diagnosis not present

## 2022-05-29 DIAGNOSIS — I2510 Atherosclerotic heart disease of native coronary artery without angina pectoris: Secondary | ICD-10-CM | POA: Diagnosis not present

## 2022-05-29 DIAGNOSIS — E790 Hyperuricemia without signs of inflammatory arthritis and tophaceous disease: Secondary | ICD-10-CM | POA: Diagnosis not present

## 2022-05-29 DIAGNOSIS — M542 Cervicalgia: Secondary | ICD-10-CM | POA: Diagnosis not present

## 2022-05-29 DIAGNOSIS — E039 Hypothyroidism, unspecified: Secondary | ICD-10-CM | POA: Diagnosis not present

## 2022-05-29 DIAGNOSIS — M25559 Pain in unspecified hip: Secondary | ICD-10-CM | POA: Diagnosis not present

## 2022-06-04 DIAGNOSIS — I700 Atherosclerosis of aorta: Secondary | ICD-10-CM | POA: Diagnosis not present

## 2022-06-04 DIAGNOSIS — F5104 Psychophysiologic insomnia: Secondary | ICD-10-CM | POA: Diagnosis not present

## 2022-06-04 DIAGNOSIS — I10 Essential (primary) hypertension: Secondary | ICD-10-CM | POA: Diagnosis not present

## 2022-06-04 DIAGNOSIS — K219 Gastro-esophageal reflux disease without esophagitis: Secondary | ICD-10-CM | POA: Diagnosis not present

## 2022-06-04 DIAGNOSIS — G4733 Obstructive sleep apnea (adult) (pediatric): Secondary | ICD-10-CM | POA: Diagnosis not present

## 2022-06-04 DIAGNOSIS — I2510 Atherosclerotic heart disease of native coronary artery without angina pectoris: Secondary | ICD-10-CM | POA: Diagnosis not present

## 2022-06-04 DIAGNOSIS — I4891 Unspecified atrial fibrillation: Secondary | ICD-10-CM | POA: Diagnosis not present

## 2022-06-04 DIAGNOSIS — I5032 Chronic diastolic (congestive) heart failure: Secondary | ICD-10-CM | POA: Diagnosis not present

## 2022-06-04 DIAGNOSIS — Z0000 Encounter for general adult medical examination without abnormal findings: Secondary | ICD-10-CM | POA: Diagnosis not present

## 2022-06-04 DIAGNOSIS — E039 Hypothyroidism, unspecified: Secondary | ICD-10-CM | POA: Diagnosis not present

## 2022-06-04 DIAGNOSIS — M5431 Sciatica, right side: Secondary | ICD-10-CM | POA: Diagnosis not present

## 2022-06-04 DIAGNOSIS — Z1212 Encounter for screening for malignant neoplasm of rectum: Secondary | ICD-10-CM | POA: Diagnosis not present

## 2022-06-04 DIAGNOSIS — Z23 Encounter for immunization: Secondary | ICD-10-CM | POA: Diagnosis not present

## 2022-06-18 DIAGNOSIS — M75102 Unspecified rotator cuff tear or rupture of left shoulder, not specified as traumatic: Secondary | ICD-10-CM | POA: Diagnosis not present

## 2022-06-18 NOTE — Progress Notes (Unsigned)
    Benito Mccreedy D.[place] Phone: [phone]   Assessment and Plan:     There are no diagnoses linked to this encounter.  ***   Pertinent previous records reviewed include ***   Follow Up: ***     Subjective:   I, Shireen Rayburn, am serving as a Education administrator for Doctor Glennon Mac  Chief Complaint: low back pain  HPI:  06/19/2022 Patient is  66 year old male complaining of low back pain. Patient states   Relevant Historical Information: ***  Additional pertinent review of systems negative.   Current Outpatient Medications:    amitriptyline (ELAVIL) 25 MG tablet, Take 25 mg by mouth at bedtime., Disp: , Rfl:    apixaban (ELIQUIS) 5 MG TABS tablet, Take 1 tablet (5 mg total) by mouth 2 (two) times daily., Disp: 60 tablet, Rfl: 3   Cholecalciferol 25 MCG (1000 UT) capsule, Take by mouth., Disp: , Rfl:    fluticasone-salmeterol (ADVAIR DISKUS) 100-50 MCG/ACT AEPB, Inhale 1 puff into the lungs 2 (two) times daily., Disp: 60 each, Rfl: 3   furosemide (LASIX) 20 MG tablet, Take 1 tablet (20 mg total) by mouth as needed., Disp: 90 tablet, Rfl: 3   levothyroxine (SYNTHROID, LEVOTHROID) 150 MCG tablet, Take 150 mcg by mouth daily before breakfast., Disp: , Rfl:    losartan-hydrochlorothiazide (HYZAAR) 100-12.5 MG tablet, Take 1 tablet by mouth daily., Disp: , Rfl:    meloxicam (MOBIC) 15 MG tablet, Take 1 tablet by mouth daily., Disp: , Rfl:    Multiple Vitamin (MULTIVITAMIN WITH MINERALS) TABS tablet, Take 1 tablet by mouth daily., Disp: , Rfl:    rosuvastatin (CRESTOR) 10 MG tablet, Take 10 mg by mouth daily., Disp: , Rfl:    TOVIAZ 8 MG TB24 tablet, Take 8 mg by mouth daily., Disp: , Rfl:    zolpidem (AMBIEN) 10 MG tablet, Take 10 mg by mouth as needed., Disp: , Rfl:    Objective:     There were no vitals filed for this visit.    There is no height or weight on file to calculate BMI.    Physical  Exam:    ***   Electronically signed by:  Benito Mccreedy D.[facility] 11:04 AM 06/18/22

## 2022-06-19 ENCOUNTER — Ambulatory Visit: Payer: PPO | Admitting: Sports Medicine

## 2022-06-19 ENCOUNTER — Ambulatory Visit (INDEPENDENT_AMBULATORY_CARE_PROVIDER_SITE_OTHER): Payer: PPO

## 2022-06-19 VITALS — Ht 68.0 in | Wt 188.0 lb

## 2022-06-19 DIAGNOSIS — G8929 Other chronic pain: Secondary | ICD-10-CM

## 2022-06-19 DIAGNOSIS — M5442 Lumbago with sciatica, left side: Secondary | ICD-10-CM | POA: Diagnosis not present

## 2022-06-19 DIAGNOSIS — M545 Low back pain, unspecified: Secondary | ICD-10-CM

## 2022-06-19 DIAGNOSIS — M5441 Lumbago with sciatica, right side: Secondary | ICD-10-CM | POA: Diagnosis not present

## 2022-06-19 MED ORDER — METHYLPREDNISOLONE 4 MG PO TBPK: ORAL_TABLET | ORAL | 0 refills | Status: DC

## 2022-06-19 NOTE — Patient Instructions (Addendum)
Good to see you Low back HEP  Tylenol [phone] mg 2-3 times a day for pain relief  Prednisone dos pak 4 mg  Recommend not using tumeric  Voltaren gel over painful areas 4-5 week follow up

## 2022-07-03 DIAGNOSIS — M75112 Incomplete rotator cuff tear or rupture of left shoulder, not specified as traumatic: Secondary | ICD-10-CM | POA: Diagnosis not present

## 2022-07-03 DIAGNOSIS — M659 Synovitis and tenosynovitis, unspecified: Secondary | ICD-10-CM | POA: Diagnosis not present

## 2022-07-03 DIAGNOSIS — G8918 Other acute postprocedural pain: Secondary | ICD-10-CM | POA: Diagnosis not present

## 2022-07-03 DIAGNOSIS — M7522 Bicipital tendinitis, left shoulder: Secondary | ICD-10-CM | POA: Diagnosis not present

## 2022-07-03 DIAGNOSIS — M75102 Unspecified rotator cuff tear or rupture of left shoulder, not specified as traumatic: Secondary | ICD-10-CM | POA: Diagnosis not present

## 2022-07-03 DIAGNOSIS — M778 Other enthesopathies, not elsewhere classified: Secondary | ICD-10-CM | POA: Diagnosis not present

## 2022-07-03 DIAGNOSIS — S43432A Superior glenoid labrum lesion of left shoulder, initial encounter: Secondary | ICD-10-CM | POA: Diagnosis not present

## 2022-07-08 DIAGNOSIS — M25412 Effusion, left shoulder: Secondary | ICD-10-CM | POA: Diagnosis not present

## 2022-07-08 DIAGNOSIS — M25512 Pain in left shoulder: Secondary | ICD-10-CM | POA: Diagnosis not present

## 2022-07-08 DIAGNOSIS — Z4789 Encounter for other orthopedic aftercare: Secondary | ICD-10-CM | POA: Diagnosis not present

## 2022-07-08 DIAGNOSIS — M25612 Stiffness of left shoulder, not elsewhere classified: Secondary | ICD-10-CM | POA: Diagnosis not present

## 2022-07-08 DIAGNOSIS — G4733 Obstructive sleep apnea (adult) (pediatric): Secondary | ICD-10-CM | POA: Diagnosis not present

## 2022-07-08 DIAGNOSIS — M6281 Muscle weakness (generalized): Secondary | ICD-10-CM | POA: Diagnosis not present

## 2022-07-11 DIAGNOSIS — M25412 Effusion, left shoulder: Secondary | ICD-10-CM | POA: Diagnosis not present

## 2022-07-11 DIAGNOSIS — G8929 Other chronic pain: Secondary | ICD-10-CM | POA: Diagnosis not present

## 2022-07-11 DIAGNOSIS — M25612 Stiffness of left shoulder, not elsewhere classified: Secondary | ICD-10-CM | POA: Diagnosis not present

## 2022-07-11 DIAGNOSIS — M6281 Muscle weakness (generalized): Secondary | ICD-10-CM | POA: Diagnosis not present

## 2022-07-11 DIAGNOSIS — Z4789 Encounter for other orthopedic aftercare: Secondary | ICD-10-CM | POA: Diagnosis not present

## 2022-07-11 DIAGNOSIS — M25512 Pain in left shoulder: Secondary | ICD-10-CM | POA: Diagnosis not present

## 2022-07-12 DIAGNOSIS — N401 Enlarged prostate with lower urinary tract symptoms: Secondary | ICD-10-CM | POA: Diagnosis not present

## 2022-07-12 DIAGNOSIS — N3281 Overactive bladder: Secondary | ICD-10-CM | POA: Diagnosis not present

## 2022-07-12 DIAGNOSIS — E039 Hypothyroidism, unspecified: Secondary | ICD-10-CM | POA: Diagnosis not present

## 2022-07-12 DIAGNOSIS — R350 Frequency of micturition: Secondary | ICD-10-CM | POA: Diagnosis not present

## 2022-07-16 NOTE — Progress Notes (Signed)
Roberto Sanford D.[place] Phone: [phone]   Assessment and Plan:     1. Chronic bilateral low back pain with bilateral sciatica 2. Greater trochanteric bursitis of right hip  -Chronic with exacerbation, subsequent visit - Patient overall felt improvement in low back pain and sciatica symptoms after completing Medrol Dosepak and starting HEP.  Continues to have some mild symptoms with most prominent being consistent with right greater trochanteric bursitis at today's visit - Patient elected to proceed with greater trochanteric CSI.  Tolerated well per note below - Discontinue Medrol Dosepak - May continue Tylenol 500 to 1000 mg tablets 2-3 times a day for day-to-day pain relief - Continue HEP for low back and incorporate new IT band HEP provided today  - Do not recommend NSAID or tumeric use due to chronic Eliquis use - May use Voltaren gel topically over areas of superficial pain  Procedure: Greater trochanteric bursal injection Side: Right  Risks explained and consent was given verbally. The site was cleaned with alcohol prep. A steroid injection was performed with patient in the lateral side-lying position at area of maximum tenderness over greater trochanter using 56m of 1% lidocaine without epinephrine and 163mof kenalog '40mg'$ /ml. This was well tolerated and resulted in symptomatic relief.  Needle was removed, hemostasis achieved, and post injection instructions were explained.  Pt was advised to call or return to clinic if these symptoms worsen or fail to improve as anticipated.    Pertinent previous records reviewed include none   Follow Up: 3 to 4 weeks for reevaluation.  Could consider formal physical therapy versus advanced imaging based on patient's symptoms   Subjective:   I, Roberto Sanford, am serving as a scEducation administratoror Doctor BeGlennon Mac Chief Complaint: low back pain   HPI:   06/19/2022 Patient is  [age]ear old male complaining of low back pain. Patient states he has been experiencing low back pain with radicular symptoms into both legs to knees over the past 6 to 8 weeks.  Denies legs giving out.  He states that he has an overactive bladder, but denies urinary incontinence or saddle paresthesia.  No injury or trauma that started pain.  He says that pain has plateaued and is not getting any better or worse.  He has tried Tylenol intermittently without benefit.  He has A-fib and is on chronic Eliquis.   07/24/2022 Patient states steroid worked very well had to modify HEP , still tight in his hamstring and into his butt cheek , was able to get up and move about he was really pleased with the steroids    Relevant Historical Information: History of atrial fibrillation on Eliquis, GERD  Additional pertinent review of systems negative.   Current Outpatient Medications:    amitriptyline (ELAVIL) 25 MG tablet, Take 25 mg by mouth at bedtime., Disp: , Rfl:    apixaban (ELIQUIS) 5 MG TABS tablet, Take 1 tablet (5 mg total) by mouth 2 (two) times daily., Disp: 60 tablet, Rfl: 3   Cholecalciferol 25 MCG (1000 UT) capsule, Take by mouth., Disp: , Rfl:    fluticasone-salmeterol (ADVAIR DISKUS) 100-50 MCG/ACT AEPB, Inhale 1 puff into the lungs 2 (two) times daily., Disp: 60 each, Rfl: 3   furosemide (LASIX) 20 MG tablet, Take 1 tablet (20 mg total) by mouth as needed., Disp: 90 tablet, Rfl: 3   levothyroxine (SYNTHROID, LEVOTHROID) 150 MCG tablet, Take 150 mcg by mouth  daily before breakfast., Disp: , Rfl:    losartan-hydrochlorothiazide (HYZAAR) 100-12.5 MG tablet, Take 1 tablet by mouth daily., Disp: , Rfl:    meloxicam (MOBIC) 15 MG tablet, Take 1 tablet by mouth daily., Disp: , Rfl:    methylPREDNISolone (MEDROL DOSEPAK) 4 MG TBPK tablet, Take 6 tablets on day 1.  Take 5 tablets on day 2.  Take 4 tablets on day 3.  Take 3 tablets on day 4.  Take 2 tablets on day 5.  Take 1  tablet on day 6., Disp: 21 tablet, Rfl: 0   Multiple Vitamin (MULTIVITAMIN WITH MINERALS) TABS tablet, Take 1 tablet by mouth daily., Disp: , Rfl:    rosuvastatin (CRESTOR) 10 MG tablet, Take 10 mg by mouth daily., Disp: , Rfl:    TOVIAZ 8 MG TB24 tablet, Take 8 mg by mouth daily., Disp: , Rfl:    zolpidem (AMBIEN) 10 MG tablet, Take 10 mg by mouth as needed., Disp: , Rfl:    Objective:     Vitals:   07/24/22 1556  BP: 102/78  Pulse: 74  SpO2: 100%  Weight: 183 lb (83 kg)  Height: '5\' 8"'$  (1.727 m)      Body mass index is 27.83 kg/m.    Physical Exam:    Gen: Appears well, nad, nontoxic and pleasant Psych: Alert and oriented, appropriate mood and affect Neuro: sensation intact, strength is 5/5 in upper and lower extremities, muscle tone wnl Skin: no susupicious lesions or rashes   Back - Normal skin, Spine with normal alignment and no deformity.   No tenderness to vertebral process palpation.   Straight leg raise negative Positive piriformis test bilaterally TTP greater trochanter, gluteal muscles, lumbar paraspinal bilaterally   Electronically signed by:  Roberto Sanford D.[facility] 4:48 PM 07/24/22

## 2022-07-17 DIAGNOSIS — M25412 Effusion, left shoulder: Secondary | ICD-10-CM | POA: Diagnosis not present

## 2022-07-17 DIAGNOSIS — M25512 Pain in left shoulder: Secondary | ICD-10-CM | POA: Diagnosis not present

## 2022-07-17 DIAGNOSIS — Z4789 Encounter for other orthopedic aftercare: Secondary | ICD-10-CM | POA: Diagnosis not present

## 2022-07-17 DIAGNOSIS — M25612 Stiffness of left shoulder, not elsewhere classified: Secondary | ICD-10-CM | POA: Diagnosis not present

## 2022-07-17 DIAGNOSIS — M6281 Muscle weakness (generalized): Secondary | ICD-10-CM | POA: Diagnosis not present

## 2022-07-17 DIAGNOSIS — G8929 Other chronic pain: Secondary | ICD-10-CM | POA: Diagnosis not present

## 2022-07-19 DIAGNOSIS — Z4789 Encounter for other orthopedic aftercare: Secondary | ICD-10-CM | POA: Diagnosis not present

## 2022-07-19 DIAGNOSIS — M6281 Muscle weakness (generalized): Secondary | ICD-10-CM | POA: Diagnosis not present

## 2022-07-19 DIAGNOSIS — M25612 Stiffness of left shoulder, not elsewhere classified: Secondary | ICD-10-CM | POA: Diagnosis not present

## 2022-07-19 DIAGNOSIS — M25412 Effusion, left shoulder: Secondary | ICD-10-CM | POA: Diagnosis not present

## 2022-07-19 DIAGNOSIS — M25512 Pain in left shoulder: Secondary | ICD-10-CM | POA: Diagnosis not present

## 2022-07-23 DIAGNOSIS — M25612 Stiffness of left shoulder, not elsewhere classified: Secondary | ICD-10-CM | POA: Diagnosis not present

## 2022-07-23 DIAGNOSIS — G8929 Other chronic pain: Secondary | ICD-10-CM | POA: Diagnosis not present

## 2022-07-23 DIAGNOSIS — Z4789 Encounter for other orthopedic aftercare: Secondary | ICD-10-CM | POA: Diagnosis not present

## 2022-07-23 DIAGNOSIS — M25412 Effusion, left shoulder: Secondary | ICD-10-CM | POA: Diagnosis not present

## 2022-07-23 DIAGNOSIS — M6281 Muscle weakness (generalized): Secondary | ICD-10-CM | POA: Diagnosis not present

## 2022-07-23 DIAGNOSIS — M25512 Pain in left shoulder: Secondary | ICD-10-CM | POA: Diagnosis not present

## 2022-07-24 ENCOUNTER — Ambulatory Visit: Payer: PPO | Admitting: Sports Medicine

## 2022-07-24 VITALS — BP 102/78 | HR 74 | Ht 68.0 in | Wt 183.0 lb

## 2022-07-24 DIAGNOSIS — M5441 Lumbago with sciatica, right side: Secondary | ICD-10-CM

## 2022-07-24 DIAGNOSIS — G8929 Other chronic pain: Secondary | ICD-10-CM

## 2022-07-24 DIAGNOSIS — M7061 Trochanteric bursitis, right hip: Secondary | ICD-10-CM

## 2022-07-24 DIAGNOSIS — M5442 Lumbago with sciatica, left side: Secondary | ICD-10-CM | POA: Diagnosis not present

## 2022-07-24 NOTE — Patient Instructions (Addendum)
Good to see you  It band HEP  3-4 week follow up

## 2022-07-29 DIAGNOSIS — Z4789 Encounter for other orthopedic aftercare: Secondary | ICD-10-CM | POA: Diagnosis not present

## 2022-07-29 DIAGNOSIS — M25512 Pain in left shoulder: Secondary | ICD-10-CM | POA: Diagnosis not present

## 2022-07-29 DIAGNOSIS — M25612 Stiffness of left shoulder, not elsewhere classified: Secondary | ICD-10-CM | POA: Diagnosis not present

## 2022-07-29 DIAGNOSIS — M6281 Muscle weakness (generalized): Secondary | ICD-10-CM | POA: Diagnosis not present

## 2022-07-29 DIAGNOSIS — M25412 Effusion, left shoulder: Secondary | ICD-10-CM | POA: Diagnosis not present

## 2022-07-31 ENCOUNTER — Encounter: Payer: Self-pay | Admitting: Pulmonary Disease

## 2022-07-31 ENCOUNTER — Ambulatory Visit (INDEPENDENT_AMBULATORY_CARE_PROVIDER_SITE_OTHER): Payer: PPO | Admitting: Pulmonary Disease

## 2022-07-31 VITALS — BP 98/64 | HR 72 | Ht 68.0 in | Wt 184.8 lb

## 2022-07-31 DIAGNOSIS — M6281 Muscle weakness (generalized): Secondary | ICD-10-CM | POA: Diagnosis not present

## 2022-07-31 DIAGNOSIS — M25412 Effusion, left shoulder: Secondary | ICD-10-CM | POA: Diagnosis not present

## 2022-07-31 DIAGNOSIS — G4733 Obstructive sleep apnea (adult) (pediatric): Secondary | ICD-10-CM | POA: Diagnosis not present

## 2022-07-31 DIAGNOSIS — M25512 Pain in left shoulder: Secondary | ICD-10-CM | POA: Diagnosis not present

## 2022-07-31 DIAGNOSIS — Z4789 Encounter for other orthopedic aftercare: Secondary | ICD-10-CM | POA: Diagnosis not present

## 2022-07-31 DIAGNOSIS — M25612 Stiffness of left shoulder, not elsewhere classified: Secondary | ICD-10-CM | POA: Diagnosis not present

## 2022-07-31 NOTE — Patient Instructions (Signed)
Will have [facility] arrange for refitting of your CPAP mask  Follow up in 3 months

## 2022-07-31 NOTE — Progress Notes (Signed)
[facility]  Chief Complaint  Patient presents with   Consult    Consult: OSA    Past Surgical History:  He  has a past surgical history that includes Hernia repair; Knee arthroscopy (Left); Trigger finger release (Right, 04/27/2015); Carpal tunnel release (Right, 04/27/2015); and Carpal tunnel release (Left, 05/18/2015).  Past Medical History:  Anxiety, A fib, GERD, Hypothyroidism, Overactive bladder  Constitutional:  BP 98/64 (BP Location: Right Arm)   Pulse 72   Ht '5\' 8"'$  (1.727 m)   Wt 184 lb 12.8 oz (83.8 kg)   SpO2 98%   BMI 28.10 kg/m   Brief Summary:  Roberto Sanford is a 66 y.o. male with obstructive sleep apnea.      Subjective:   He did a sleep study years ago.  Results aren't available.  Got new auto CPAP through [facility] few months ago.  Using nasal mask.  Phone APP shows he is getting mask leak and AHI increased.  He goes to sleep at 10 pm.  He falls asleep in 20 minutes.  He wakes up several times to use the bathroom.  He gets out of bed at 7 am.  He feels tired in the morning.  He denies morning headache.  He uses [place] a few times per week.  Drinks soda in the morning.  Falls asleep when not active.  He denies sleep walking, sleep talking, bruxism, or nightmares.  There is no history of restless legs.  He denies sleep hallucinations, sleep paralysis, or cataplexy.  The Epworth score is 11 out of 24.   Physical Exam:   Appearance - well kempt   ENMT - no sinus tenderness, no oral exudate, no LAN, Mallampati 4 airway, no stridor  Respiratory - equal breath sounds bilaterally, no wheezing or rales  CV - s1s2 regular rate and rhythm, no murmurs  Ext - no clubbing, no edema  Skin - no rashes  Psych - normal mood and affect   Pulmonary testing:  PFT 05/11/21 >> FEV1 2.89 (89%), FEV1% 86, TLC 6.04 (91%), DLCO 137%  Chest Imaging:  HRCT chest 05/15/21 >> diffuse septal thickening, scattered calcified granulomas,  few scattered nodules up to 5 mm, minimal air trapping  Sleep Tests:    Social History:  He  reports that he has never smoked. He has never used smokeless tobacco. He reports that he does not drink alcohol and does not use drugs.  Family History:  His family history includes Cancer in his father; Diabetes in his mother.     Assessment/Plan:   Obstructive sleep apnea. - he is compliant with CPAP and reports benefit from therapy - he uses [employer] for his DME - he is having problem with mask leak; will get his mask refit - will get his CPAP download and then determine if he needs adjustment to his settings  Time Spent Involved in Patient Care on Day of Examination:  35 minutes  Follow up:   Patient Instructions  Will have [place] arrange for refitting of your CPAP mask  Follow up in 3 months  Medication List:   Allergies as of 07/31/2022   No Known Allergies      Medication List        Accurate as of July 31, 2022  3:43 PM. If you have any questions, ask your nurse or doctor.          STOP taking these medications    fluticasone-salmeterol 100-50 MCG/ACT Aepb Commonly  known as: Advair Diskus Stopped by: Chesley Mires, MD   methylPREDNISolone 4 MG Tbpk tablet Commonly known as: MEDROL DOSEPAK Stopped by: Chesley Mires, MD       TAKE these medications    amitriptyline 25 MG tablet Commonly known as: ELAVIL Take 25 mg by mouth at bedtime.   apixaban 5 MG Tabs tablet Commonly known as: ELIQUIS Take 1 tablet (5 mg total) by mouth 2 (two) times daily.   Cholecalciferol 25 MCG (1000 UT) capsule Take by mouth.   furosemide 20 MG tablet Commonly known as: LASIX Take 1 tablet (20 mg total) by mouth as needed.   levothyroxine 150 MCG tablet Commonly known as: SYNTHROID Take 150 mcg by mouth daily before breakfast.   losartan-hydrochlorothiazide 100-12.5 MG tablet Commonly known as: HYZAAR Take 1 tablet by mouth daily.   meloxicam 15 MG tablet Commonly  known as: MOBIC Take 1 tablet by mouth daily.   multivitamin with minerals Tabs tablet Take 1 tablet by mouth daily.   rosuvastatin 10 MG tablet Commonly known as: CRESTOR Take 10 mg by mouth daily.   Toviaz 8 MG Tb24 tablet Generic drug: fesoterodine Take 8 mg by mouth daily.   zolpidem 10 MG tablet Commonly known as: AMBIEN Take 10 mg by mouth as needed.        Signature:  Chesley Mires, MD [place] Pager - [phone] 07/31/2022, 3:43 PM

## 2022-08-07 DIAGNOSIS — M25612 Stiffness of left shoulder, not elsewhere classified: Secondary | ICD-10-CM | POA: Diagnosis not present

## 2022-08-07 DIAGNOSIS — M6281 Muscle weakness (generalized): Secondary | ICD-10-CM | POA: Diagnosis not present

## 2022-08-07 DIAGNOSIS — M25512 Pain in left shoulder: Secondary | ICD-10-CM | POA: Diagnosis not present

## 2022-08-07 DIAGNOSIS — Z4789 Encounter for other orthopedic aftercare: Secondary | ICD-10-CM | POA: Diagnosis not present

## 2022-08-07 DIAGNOSIS — M25412 Effusion, left shoulder: Secondary | ICD-10-CM | POA: Diagnosis not present

## 2022-08-08 DIAGNOSIS — G4733 Obstructive sleep apnea (adult) (pediatric): Secondary | ICD-10-CM | POA: Diagnosis not present

## 2022-08-09 DIAGNOSIS — M25512 Pain in left shoulder: Secondary | ICD-10-CM | POA: Diagnosis not present

## 2022-08-09 DIAGNOSIS — Z4789 Encounter for other orthopedic aftercare: Secondary | ICD-10-CM | POA: Diagnosis not present

## 2022-08-09 DIAGNOSIS — M25612 Stiffness of left shoulder, not elsewhere classified: Secondary | ICD-10-CM | POA: Diagnosis not present

## 2022-08-09 DIAGNOSIS — M6281 Muscle weakness (generalized): Secondary | ICD-10-CM | POA: Diagnosis not present

## 2022-08-09 DIAGNOSIS — M25412 Effusion, left shoulder: Secondary | ICD-10-CM | POA: Diagnosis not present

## 2022-08-19 DIAGNOSIS — Z4789 Encounter for other orthopedic aftercare: Secondary | ICD-10-CM | POA: Diagnosis not present

## 2022-08-19 DIAGNOSIS — M25612 Stiffness of left shoulder, not elsewhere classified: Secondary | ICD-10-CM | POA: Diagnosis not present

## 2022-08-19 DIAGNOSIS — M25412 Effusion, left shoulder: Secondary | ICD-10-CM | POA: Diagnosis not present

## 2022-08-19 DIAGNOSIS — G8929 Other chronic pain: Secondary | ICD-10-CM | POA: Diagnosis not present

## 2022-08-19 DIAGNOSIS — M25512 Pain in left shoulder: Secondary | ICD-10-CM | POA: Diagnosis not present

## 2022-08-19 DIAGNOSIS — M6281 Muscle weakness (generalized): Secondary | ICD-10-CM | POA: Diagnosis not present

## 2022-08-20 NOTE — Progress Notes (Deleted)
    Roberto Sanford D.[place] Phone: [phone]   Assessment and Plan:     There are no diagnoses linked to this encounter.  ***   Pertinent previous records reviewed include ***   Follow Up: ***     Subjective:   I, Roberto Sanford, am serving as a Education administrator for Doctor Glennon Mac   Chief Complaint: low back pain   HPI:  06/19/2022 Patient is  66 year old male complaining of low back pain. Patient states he has been experiencing low back pain with radicular symptoms into both legs to knees over the past 6 to 8 weeks.  Denies legs giving out.  He states that he has an overactive bladder, but denies urinary incontinence or saddle paresthesia.  No injury or trauma that started pain.  He says that pain has plateaued and is not getting any better or worse.  He has tried Tylenol intermittently without benefit.  He has A-fib and is on chronic Eliquis.   07/24/2022 Patient states steroid worked very well had to modify HEP , still tight in his hamstring and into his butt cheek , was able to get up and move about he was really pleased with the steroids    08/21/2022 Patient states    Relevant Historical Information: History of atrial fibrillation on Eliquis, GERD  Additional pertinent review of systems negative.   Current Outpatient Medications:    amitriptyline (ELAVIL) 25 MG tablet, Take 25 mg by mouth at bedtime., Disp: , Rfl:    apixaban (ELIQUIS) 5 MG TABS tablet, Take 1 tablet (5 mg total) by mouth 2 (two) times daily., Disp: 60 tablet, Rfl: 3   Cholecalciferol 25 MCG (1000 UT) capsule, Take by mouth., Disp: , Rfl:    furosemide (LASIX) 20 MG tablet, Take 1 tablet (20 mg total) by mouth as needed., Disp: 90 tablet, Rfl: 3   levothyroxine (SYNTHROID, LEVOTHROID) 150 MCG tablet, Take 150 mcg by mouth daily before breakfast., Disp: , Rfl:    losartan-hydrochlorothiazide (HYZAAR) 100-12.5 MG tablet, Take 1  tablet by mouth daily., Disp: , Rfl:    meloxicam (MOBIC) 15 MG tablet, Take 1 tablet by mouth daily., Disp: , Rfl:    Multiple Vitamin (MULTIVITAMIN WITH MINERALS) TABS tablet, Take 1 tablet by mouth daily., Disp: , Rfl:    rosuvastatin (CRESTOR) 10 MG tablet, Take 10 mg by mouth daily., Disp: , Rfl:    TOVIAZ 8 MG TB24 tablet, Take 8 mg by mouth daily., Disp: , Rfl:    zolpidem (AMBIEN) 10 MG tablet, Take 10 mg by mouth as needed., Disp: , Rfl:    Objective:     There were no vitals filed for this visit.    There is no height or weight on file to calculate BMI.    Physical Exam:    ***   Electronically signed by:  Roberto Sanford D.[facility] 8:02 AM 08/20/22

## 2022-08-21 ENCOUNTER — Ambulatory Visit: Payer: PPO | Admitting: Sports Medicine

## 2022-08-22 NOTE — Progress Notes (Unsigned)
    Roberto Sanford D.[place] Phone: [phone]   Assessment and Plan:     There are no diagnoses linked to this encounter.  ***   Pertinent previous records reviewed include ***   Follow Up: ***     Subjective:   I, Roberto Sanford, am serving as a Education administrator for Roberto Sanford   Chief Complaint: low back pain   HPI:  06/19/2022 Patient is  66 year old male complaining of low back pain. Patient states he has been experiencing low back pain with radicular symptoms into both legs to knees over the past 6 to 8 weeks.  Denies legs giving out.  He states that he has an overactive bladder, but denies urinary incontinence or saddle paresthesia.  No injury or trauma that started pain.  He says that pain has plateaued and is not getting any better or worse.  He has tried Tylenol intermittently without benefit.  He has A-fib and is on chronic Eliquis.   07/24/2022 Patient states steroid worked very well had to modify HEP , still tight in his hamstring and into his butt cheek , was able to get up and move about he was really pleased with the steroids    08/28/2022 Patient states   Relevant Historical Information: History of atrial fibrillation on Eliquis, GERD  Additional pertinent review of systems negative.   Current Outpatient Medications:    amitriptyline (ELAVIL) 25 MG tablet, Take 25 mg by mouth at bedtime., Disp: , Rfl:    apixaban (ELIQUIS) 5 MG TABS tablet, Take 1 tablet (5 mg total) by mouth 2 (two) times daily., Disp: 60 tablet, Rfl: 3   Cholecalciferol 25 MCG (1000 UT) capsule, Take by mouth., Disp: , Rfl:    furosemide (LASIX) 20 MG tablet, Take 1 tablet (20 mg total) by mouth as needed., Disp: 90 tablet, Rfl: 3   levothyroxine (SYNTHROID, LEVOTHROID) 150 MCG tablet, Take 150 mcg by mouth daily before breakfast., Disp: , Rfl:    losartan-hydrochlorothiazide (HYZAAR) 100-12.5 MG tablet, Take 1  tablet by mouth daily., Disp: , Rfl:    meloxicam (MOBIC) 15 MG tablet, Take 1 tablet by mouth daily., Disp: , Rfl:    Multiple Vitamin (MULTIVITAMIN WITH MINERALS) TABS tablet, Take 1 tablet by mouth daily., Disp: , Rfl:    rosuvastatin (CRESTOR) 10 MG tablet, Take 10 mg by mouth daily., Disp: , Rfl:    TOVIAZ 8 MG TB24 tablet, Take 8 mg by mouth daily., Disp: , Rfl:    zolpidem (AMBIEN) 10 MG tablet, Take 10 mg by mouth as needed., Disp: , Rfl:    Objective:     There were no vitals filed for this visit.    There is no height or weight on file to calculate BMI.    Physical Exam:    ***   Electronically signed by:  Roberto Sanford D.[facility] 10:27 AM 08/22/22

## 2022-08-23 ENCOUNTER — Telehealth: Payer: Self-pay | Admitting: Pulmonary Disease

## 2022-08-23 ENCOUNTER — Ambulatory Visit: Payer: PPO | Admitting: Sports Medicine

## 2022-08-23 DIAGNOSIS — M25412 Effusion, left shoulder: Secondary | ICD-10-CM | POA: Diagnosis not present

## 2022-08-23 DIAGNOSIS — M25512 Pain in left shoulder: Secondary | ICD-10-CM | POA: Diagnosis not present

## 2022-08-23 DIAGNOSIS — M25612 Stiffness of left shoulder, not elsewhere classified: Secondary | ICD-10-CM | POA: Diagnosis not present

## 2022-08-23 DIAGNOSIS — M6281 Muscle weakness (generalized): Secondary | ICD-10-CM | POA: Diagnosis not present

## 2022-08-23 DIAGNOSIS — Z4789 Encounter for other orthopedic aftercare: Secondary | ICD-10-CM | POA: Diagnosis not present

## 2022-08-23 NOTE — Telephone Encounter (Signed)
I called and spoke with the wife (DPR) and she voices understanding. Nothing further needed.

## 2022-08-23 NOTE — Telephone Encounter (Signed)
Auto CPAP 07/02/22 to 07/31/22 >> used on 30 of 30 nights with average 7 hrs 20 min.  Average AHI 6.6 with median CPAP 6 and 95 th percentile CPAP 8 cm H2O.  Please let him know that his CPAP download shows good control of sleep apnea with current settings.

## 2022-08-26 DIAGNOSIS — Z4789 Encounter for other orthopedic aftercare: Secondary | ICD-10-CM | POA: Diagnosis not present

## 2022-08-26 DIAGNOSIS — M25412 Effusion, left shoulder: Secondary | ICD-10-CM | POA: Diagnosis not present

## 2022-08-26 DIAGNOSIS — M25512 Pain in left shoulder: Secondary | ICD-10-CM | POA: Diagnosis not present

## 2022-08-26 DIAGNOSIS — M25612 Stiffness of left shoulder, not elsewhere classified: Secondary | ICD-10-CM | POA: Diagnosis not present

## 2022-08-26 DIAGNOSIS — M6281 Muscle weakness (generalized): Secondary | ICD-10-CM | POA: Diagnosis not present

## 2022-08-28 ENCOUNTER — Ambulatory Visit: Payer: PPO | Admitting: Sports Medicine

## 2022-08-28 VITALS — BP 126/80 | HR 53 | Ht 68.0 in | Wt 183.0 lb

## 2022-08-28 DIAGNOSIS — M25562 Pain in left knee: Secondary | ICD-10-CM | POA: Diagnosis not present

## 2022-08-28 DIAGNOSIS — G8929 Other chronic pain: Secondary | ICD-10-CM | POA: Diagnosis not present

## 2022-08-28 DIAGNOSIS — G4733 Obstructive sleep apnea (adult) (pediatric): Secondary | ICD-10-CM | POA: Diagnosis not present

## 2022-08-28 DIAGNOSIS — M7061 Trochanteric bursitis, right hip: Secondary | ICD-10-CM | POA: Diagnosis not present

## 2022-08-28 MED ORDER — METHYLPREDNISOLONE 4 MG PO TBPK: ORAL_TABLET | ORAL | 0 refills | Status: DC

## 2022-08-28 NOTE — Patient Instructions (Addendum)
Good to see you  Refill prednisone start a day before your trip in November  Continue IT band HEP  Knee HEP  As needed follow up

## 2022-08-30 DIAGNOSIS — M25612 Stiffness of left shoulder, not elsewhere classified: Secondary | ICD-10-CM | POA: Diagnosis not present

## 2022-08-30 DIAGNOSIS — M25512 Pain in left shoulder: Secondary | ICD-10-CM | POA: Diagnosis not present

## 2022-08-30 DIAGNOSIS — M25412 Effusion, left shoulder: Secondary | ICD-10-CM | POA: Diagnosis not present

## 2022-08-30 DIAGNOSIS — M6281 Muscle weakness (generalized): Secondary | ICD-10-CM | POA: Diagnosis not present

## 2022-09-05 DIAGNOSIS — M25612 Stiffness of left shoulder, not elsewhere classified: Secondary | ICD-10-CM | POA: Diagnosis not present

## 2022-09-05 DIAGNOSIS — M6281 Muscle weakness (generalized): Secondary | ICD-10-CM | POA: Diagnosis not present

## 2022-09-05 DIAGNOSIS — M25412 Effusion, left shoulder: Secondary | ICD-10-CM | POA: Diagnosis not present

## 2022-09-05 DIAGNOSIS — M25512 Pain in left shoulder: Secondary | ICD-10-CM | POA: Diagnosis not present

## 2022-09-05 DIAGNOSIS — Z4789 Encounter for other orthopedic aftercare: Secondary | ICD-10-CM | POA: Diagnosis not present

## 2022-09-09 DIAGNOSIS — M25612 Stiffness of left shoulder, not elsewhere classified: Secondary | ICD-10-CM | POA: Diagnosis not present

## 2022-09-09 DIAGNOSIS — M25512 Pain in left shoulder: Secondary | ICD-10-CM | POA: Diagnosis not present

## 2022-09-09 DIAGNOSIS — M6281 Muscle weakness (generalized): Secondary | ICD-10-CM | POA: Diagnosis not present

## 2022-09-09 DIAGNOSIS — M25412 Effusion, left shoulder: Secondary | ICD-10-CM | POA: Diagnosis not present

## 2022-09-09 DIAGNOSIS — Z4789 Encounter for other orthopedic aftercare: Secondary | ICD-10-CM | POA: Diagnosis not present

## 2022-09-12 DIAGNOSIS — Z4789 Encounter for other orthopedic aftercare: Secondary | ICD-10-CM | POA: Diagnosis not present

## 2022-09-12 DIAGNOSIS — M25612 Stiffness of left shoulder, not elsewhere classified: Secondary | ICD-10-CM | POA: Diagnosis not present

## 2022-09-12 DIAGNOSIS — M25512 Pain in left shoulder: Secondary | ICD-10-CM | POA: Diagnosis not present

## 2022-09-12 DIAGNOSIS — M25412 Effusion, left shoulder: Secondary | ICD-10-CM | POA: Diagnosis not present

## 2022-09-12 DIAGNOSIS — M6281 Muscle weakness (generalized): Secondary | ICD-10-CM | POA: Diagnosis not present

## 2022-09-13 DIAGNOSIS — E039 Hypothyroidism, unspecified: Secondary | ICD-10-CM | POA: Diagnosis not present

## 2022-09-19 DIAGNOSIS — M25512 Pain in left shoulder: Secondary | ICD-10-CM | POA: Diagnosis not present

## 2022-09-19 DIAGNOSIS — Z4789 Encounter for other orthopedic aftercare: Secondary | ICD-10-CM | POA: Diagnosis not present

## 2022-09-19 DIAGNOSIS — M6281 Muscle weakness (generalized): Secondary | ICD-10-CM | POA: Diagnosis not present

## 2022-09-19 DIAGNOSIS — G8929 Other chronic pain: Secondary | ICD-10-CM | POA: Diagnosis not present

## 2022-09-19 DIAGNOSIS — M25612 Stiffness of left shoulder, not elsewhere classified: Secondary | ICD-10-CM | POA: Diagnosis not present

## 2022-09-19 DIAGNOSIS — M25412 Effusion, left shoulder: Secondary | ICD-10-CM | POA: Diagnosis not present

## 2022-09-27 DIAGNOSIS — G4733 Obstructive sleep apnea (adult) (pediatric): Secondary | ICD-10-CM | POA: Diagnosis not present

## 2022-10-02 DIAGNOSIS — M6281 Muscle weakness (generalized): Secondary | ICD-10-CM | POA: Diagnosis not present

## 2022-10-02 DIAGNOSIS — M25412 Effusion, left shoulder: Secondary | ICD-10-CM | POA: Diagnosis not present

## 2022-10-02 DIAGNOSIS — M25612 Stiffness of left shoulder, not elsewhere classified: Secondary | ICD-10-CM | POA: Diagnosis not present

## 2022-10-02 DIAGNOSIS — Z4789 Encounter for other orthopedic aftercare: Secondary | ICD-10-CM | POA: Diagnosis not present

## 2022-10-02 DIAGNOSIS — M25512 Pain in left shoulder: Secondary | ICD-10-CM | POA: Diagnosis not present

## 2022-10-09 DIAGNOSIS — G4733 Obstructive sleep apnea (adult) (pediatric): Secondary | ICD-10-CM | POA: Diagnosis not present

## 2022-10-12 ENCOUNTER — Other Ambulatory Visit: Payer: Self-pay | Admitting: Cardiovascular Disease

## 2022-10-14 NOTE — Telephone Encounter (Signed)
Prescription refill request for Eliquis received. Indication: AF Last office visit: 05/20/2022   Dorann Lodge  MD Scr: 1.25 on 04/20/21 Age: 66 Weight: 89.5kg  Based on above findings Eliquis '5mg'$  twice daily is the appropriate dose.  Refill approved.  Labs are past due.  Pt has appt with Dr Audie Box in Dec.  Requested labs be done at that time.

## 2022-10-25 ENCOUNTER — Telehealth: Payer: Self-pay | Admitting: Pulmonary Disease

## 2022-10-25 NOTE — Telephone Encounter (Signed)
Looked in [employer] in [employer] and did not see any forms for this patient. Vallarie Mare, have you received a CMN on this patient?

## 2022-10-25 NOTE — Telephone Encounter (Signed)
Dr. Halford Chessman, please advise if a document on pt has been brought to you at [facility].

## 2022-10-25 NOTE — Telephone Encounter (Signed)
I have not received anything regarding this.

## 2022-10-25 NOTE — Telephone Encounter (Signed)
I do not see a CMN on this patient

## 2022-10-28 DIAGNOSIS — G4733 Obstructive sleep apnea (adult) (pediatric): Secondary | ICD-10-CM | POA: Diagnosis not present

## 2022-11-23 DIAGNOSIS — R051 Acute cough: Secondary | ICD-10-CM | POA: Diagnosis not present

## 2022-11-23 DIAGNOSIS — J209 Acute bronchitis, unspecified: Secondary | ICD-10-CM | POA: Diagnosis not present

## 2022-11-25 DIAGNOSIS — M549 Dorsalgia, unspecified: Secondary | ICD-10-CM | POA: Diagnosis not present

## 2022-11-25 DIAGNOSIS — M25541 Pain in joints of right hand: Secondary | ICD-10-CM | POA: Diagnosis not present

## 2022-11-25 DIAGNOSIS — M1990 Unspecified osteoarthritis, unspecified site: Secondary | ICD-10-CM | POA: Diagnosis not present

## 2022-11-25 DIAGNOSIS — E790 Hyperuricemia without signs of inflammatory arthritis and tophaceous disease: Secondary | ICD-10-CM | POA: Diagnosis not present

## 2022-11-25 DIAGNOSIS — M542 Cervicalgia: Secondary | ICD-10-CM | POA: Diagnosis not present

## 2022-11-25 DIAGNOSIS — M79644 Pain in right finger(s): Secondary | ICD-10-CM | POA: Diagnosis not present

## 2022-11-25 DIAGNOSIS — N289 Disorder of kidney and ureter, unspecified: Secondary | ICD-10-CM | POA: Diagnosis not present

## 2022-11-25 DIAGNOSIS — K219 Gastro-esophageal reflux disease without esophagitis: Secondary | ICD-10-CM | POA: Diagnosis not present

## 2022-11-27 DIAGNOSIS — G4733 Obstructive sleep apnea (adult) (pediatric): Secondary | ICD-10-CM | POA: Diagnosis not present

## 2022-11-28 DIAGNOSIS — M75102 Unspecified rotator cuff tear or rupture of left shoulder, not specified as traumatic: Secondary | ICD-10-CM | POA: Diagnosis not present

## 2022-12-04 DIAGNOSIS — E039 Hypothyroidism, unspecified: Secondary | ICD-10-CM | POA: Diagnosis not present

## 2022-12-09 DIAGNOSIS — G4733 Obstructive sleep apnea (adult) (pediatric): Secondary | ICD-10-CM | POA: Diagnosis not present

## 2022-12-19 DIAGNOSIS — D235 Other benign neoplasm of skin of trunk: Secondary | ICD-10-CM | POA: Diagnosis not present

## 2022-12-19 DIAGNOSIS — L578 Other skin changes due to chronic exposure to nonionizing radiation: Secondary | ICD-10-CM | POA: Diagnosis not present

## 2022-12-19 DIAGNOSIS — D2372 Other benign neoplasm of skin of left lower limb, including hip: Secondary | ICD-10-CM | POA: Diagnosis not present

## 2022-12-19 DIAGNOSIS — L821 Other seborrheic keratosis: Secondary | ICD-10-CM | POA: Diagnosis not present

## 2022-12-19 DIAGNOSIS — D225 Melanocytic nevi of trunk: Secondary | ICD-10-CM | POA: Diagnosis not present

## 2022-12-28 DIAGNOSIS — G4733 Obstructive sleep apnea (adult) (pediatric): Secondary | ICD-10-CM | POA: Diagnosis not present

## 2023-01-28 DIAGNOSIS — G4733 Obstructive sleep apnea (adult) (pediatric): Secondary | ICD-10-CM | POA: Diagnosis not present

## 2023-02-06 DIAGNOSIS — G4733 Obstructive sleep apnea (adult) (pediatric): Secondary | ICD-10-CM | POA: Diagnosis not present

## 2023-02-26 DIAGNOSIS — N401 Enlarged prostate with lower urinary tract symptoms: Secondary | ICD-10-CM | POA: Diagnosis not present

## 2023-02-26 DIAGNOSIS — G4733 Obstructive sleep apnea (adult) (pediatric): Secondary | ICD-10-CM | POA: Diagnosis not present

## 2023-03-05 DIAGNOSIS — R972 Elevated prostate specific antigen [PSA]: Secondary | ICD-10-CM | POA: Diagnosis not present

## 2023-03-05 DIAGNOSIS — N401 Enlarged prostate with lower urinary tract symptoms: Secondary | ICD-10-CM | POA: Diagnosis not present

## 2023-03-05 DIAGNOSIS — R350 Frequency of micturition: Secondary | ICD-10-CM | POA: Diagnosis not present

## 2023-03-07 ENCOUNTER — Other Ambulatory Visit: Payer: Self-pay | Admitting: Urology

## 2023-03-07 DIAGNOSIS — R972 Elevated prostate specific antigen [PSA]: Secondary | ICD-10-CM

## 2023-03-09 DIAGNOSIS — E039 Hypothyroidism, unspecified: Secondary | ICD-10-CM | POA: Diagnosis not present

## 2023-03-09 DIAGNOSIS — K219 Gastro-esophageal reflux disease without esophagitis: Secondary | ICD-10-CM | POA: Diagnosis not present

## 2023-03-09 DIAGNOSIS — M1990 Unspecified osteoarthritis, unspecified site: Secondary | ICD-10-CM | POA: Diagnosis not present

## 2023-03-09 DIAGNOSIS — I10 Essential (primary) hypertension: Secondary | ICD-10-CM | POA: Diagnosis not present

## 2023-03-29 DIAGNOSIS — G4733 Obstructive sleep apnea (adult) (pediatric): Secondary | ICD-10-CM | POA: Diagnosis not present

## 2023-04-03 DIAGNOSIS — M79641 Pain in right hand: Secondary | ICD-10-CM | POA: Diagnosis not present

## 2023-04-03 DIAGNOSIS — M25531 Pain in right wrist: Secondary | ICD-10-CM | POA: Diagnosis not present

## 2023-04-03 DIAGNOSIS — N289 Disorder of kidney and ureter, unspecified: Secondary | ICD-10-CM | POA: Diagnosis not present

## 2023-04-03 DIAGNOSIS — M25532 Pain in left wrist: Secondary | ICD-10-CM | POA: Diagnosis not present

## 2023-04-03 DIAGNOSIS — M109 Gout, unspecified: Secondary | ICD-10-CM | POA: Diagnosis not present

## 2023-04-03 DIAGNOSIS — E039 Hypothyroidism, unspecified: Secondary | ICD-10-CM | POA: Diagnosis not present

## 2023-04-03 DIAGNOSIS — M1990 Unspecified osteoarthritis, unspecified site: Secondary | ICD-10-CM | POA: Diagnosis not present

## 2023-04-03 DIAGNOSIS — M5030 Other cervical disc degeneration, unspecified cervical region: Secondary | ICD-10-CM | POA: Diagnosis not present

## 2023-04-03 DIAGNOSIS — M5136 Other intervertebral disc degeneration, lumbar region: Secondary | ICD-10-CM | POA: Diagnosis not present

## 2023-04-03 DIAGNOSIS — E790 Hyperuricemia without signs of inflammatory arthritis and tophaceous disease: Secondary | ICD-10-CM | POA: Diagnosis not present

## 2023-04-03 DIAGNOSIS — K219 Gastro-esophageal reflux disease without esophagitis: Secondary | ICD-10-CM | POA: Diagnosis not present

## 2023-04-03 DIAGNOSIS — M79642 Pain in left hand: Secondary | ICD-10-CM | POA: Diagnosis not present

## 2023-04-05 ENCOUNTER — Ambulatory Visit
Admission: RE | Admit: 2023-04-05 | Discharge: 2023-04-05 | Disposition: A | Discharge status: Home / Self Care | Payer: PPO | Source: Ambulatory Visit | Attending: Urology | Admitting: Urology

## 2023-04-05 DIAGNOSIS — R972 Elevated prostate specific antigen [PSA]: Secondary | ICD-10-CM

## 2023-04-05 MED ORDER — GADOPICLENOL 0.5 MMOL/ML IV SOLN
8.0000 mL | Freq: Once | INTRAVENOUS | Status: AC | PRN
  Administered 2023-04-05: 8 mL via INTRAVENOUS

## 2023-04-09 DIAGNOSIS — Z86010 Personal history of colonic polyps: Secondary | ICD-10-CM | POA: Diagnosis not present

## 2023-04-09 DIAGNOSIS — I10 Essential (primary) hypertension: Secondary | ICD-10-CM | POA: Diagnosis not present

## 2023-04-09 DIAGNOSIS — Z1211 Encounter for screening for malignant neoplasm of colon: Secondary | ICD-10-CM | POA: Diagnosis not present

## 2023-04-09 DIAGNOSIS — I482 Chronic atrial fibrillation, unspecified: Secondary | ICD-10-CM | POA: Diagnosis not present

## 2023-05-06 ENCOUNTER — Other Ambulatory Visit: Payer: Self-pay | Admitting: Cardiovascular Disease

## 2023-05-06 NOTE — Telephone Encounter (Signed)
Pt last saw Dr Scharlene Gloss 05/20/22, last labs 04/20/21 Creat 1.25, pt is overdue for labwork.  Called pt's PCP [facility] to request copy of most recent labwork, had to leave message on VM. Will await fax or call back. Pt is also overdue for 6 month follow-up appt with MD. Msg sent to schedulers.

## 2023-05-07 ENCOUNTER — Telehealth: Payer: Self-pay | Admitting: Cardiovascular Disease

## 2023-05-07 DIAGNOSIS — I4891 Unspecified atrial fibrillation: Secondary | ICD-10-CM

## 2023-05-07 NOTE — Telephone Encounter (Signed)
Labs received from PCP.  Scr 0.85 on 04/03/23

## 2023-05-07 NOTE — Telephone Encounter (Signed)
Age 67, weight 83kg, Creat 0.85 on 04/03/23, based on specified criteria pt is on appropriate dosage of Eliquis 5mg  BID for afib.  Will refill rx x 3 months with note must see MD for FUTURE refills.

## 2023-05-07 NOTE — Telephone Encounter (Signed)
  Pt is scheduled with Dr. Flora Lipps on 08/09 at  am. Per notes he needs labs but no order on file yet

## 2023-05-07 NOTE — Telephone Encounter (Signed)
Patient appt for August, please advise labs you would like ordered.

## 2023-05-07 NOTE — Telephone Encounter (Signed)
Spoke with patient wife as number for him only is no longer in service.  Advised there are no labs ordered but the doctor does want him to have an echo completed prior to appointment.  Advised that order would be placed and they will call to schedule.  Advised that he should ask if possible to have done in order to be finalized before appt.  They will schedule accordingly.

## 2023-05-20 ENCOUNTER — Encounter (HOSPITAL_BASED_OUTPATIENT_CLINIC_OR_DEPARTMENT_OTHER): Payer: Self-pay | Admitting: Urology

## 2023-05-20 ENCOUNTER — Emergency Department (HOSPITAL_BASED_OUTPATIENT_CLINIC_OR_DEPARTMENT_OTHER): Payer: PPO

## 2023-05-20 ENCOUNTER — Inpatient Hospital Stay (HOSPITAL_BASED_OUTPATIENT_CLINIC_OR_DEPARTMENT_OTHER)
Admission: EM | Admit: 2023-05-20 | Discharge: 2023-05-26 | DRG: 418 | Disposition: A | Discharge status: Home / Self Care | Payer: PPO | Attending: Internal Medicine | Admitting: Internal Medicine

## 2023-05-20 ENCOUNTER — Other Ambulatory Visit: Payer: Self-pay

## 2023-05-20 DIAGNOSIS — E861 Hypovolemia: Secondary | ICD-10-CM | POA: Diagnosis present

## 2023-05-20 DIAGNOSIS — I10 Essential (primary) hypertension: Secondary | ICD-10-CM | POA: Diagnosis present

## 2023-05-20 DIAGNOSIS — R9431 Abnormal electrocardiogram [ECG] [EKG]: Secondary | ICD-10-CM | POA: Diagnosis not present

## 2023-05-20 DIAGNOSIS — K810 Acute cholecystitis: Principal | ICD-10-CM

## 2023-05-20 DIAGNOSIS — Z7901 Long term (current) use of anticoagulants: Secondary | ICD-10-CM

## 2023-05-20 DIAGNOSIS — I4821 Permanent atrial fibrillation: Secondary | ICD-10-CM | POA: Diagnosis not present

## 2023-05-20 DIAGNOSIS — I700 Atherosclerosis of aorta: Secondary | ICD-10-CM | POA: Diagnosis not present

## 2023-05-20 DIAGNOSIS — K8063 Calculus of gallbladder and bile duct with acute cholecystitis with obstruction: Principal | ICD-10-CM | POA: Diagnosis present

## 2023-05-20 DIAGNOSIS — G4733 Obstructive sleep apnea (adult) (pediatric): Secondary | ICD-10-CM | POA: Diagnosis present

## 2023-05-20 DIAGNOSIS — E871 Hypo-osmolality and hyponatremia: Secondary | ICD-10-CM | POA: Diagnosis present

## 2023-05-20 DIAGNOSIS — M1990 Unspecified osteoarthritis, unspecified site: Secondary | ICD-10-CM | POA: Diagnosis present

## 2023-05-20 DIAGNOSIS — F32A Depression, unspecified: Secondary | ICD-10-CM | POA: Diagnosis present

## 2023-05-20 DIAGNOSIS — Z79890 Hormone replacement therapy (postmenopausal): Secondary | ICD-10-CM

## 2023-05-20 DIAGNOSIS — R1013 Epigastric pain: Secondary | ICD-10-CM | POA: Diagnosis not present

## 2023-05-20 DIAGNOSIS — R109 Unspecified abdominal pain: Secondary | ICD-10-CM | POA: Diagnosis not present

## 2023-05-20 DIAGNOSIS — Z79899 Other long term (current) drug therapy: Secondary | ICD-10-CM

## 2023-05-20 DIAGNOSIS — K219 Gastro-esophageal reflux disease without esophagitis: Secondary | ICD-10-CM | POA: Diagnosis present

## 2023-05-20 DIAGNOSIS — K8000 Calculus of gallbladder with acute cholecystitis without obstruction: Secondary | ICD-10-CM | POA: Diagnosis present

## 2023-05-20 DIAGNOSIS — R110 Nausea: Secondary | ICD-10-CM | POA: Diagnosis not present

## 2023-05-20 DIAGNOSIS — N3281 Overactive bladder: Secondary | ICD-10-CM | POA: Diagnosis present

## 2023-05-20 DIAGNOSIS — F419 Anxiety disorder, unspecified: Secondary | ICD-10-CM | POA: Diagnosis present

## 2023-05-20 DIAGNOSIS — R739 Hyperglycemia, unspecified: Secondary | ICD-10-CM | POA: Diagnosis present

## 2023-05-20 DIAGNOSIS — E785 Hyperlipidemia, unspecified: Secondary | ICD-10-CM | POA: Diagnosis present

## 2023-05-20 DIAGNOSIS — E039 Hypothyroidism, unspecified: Secondary | ICD-10-CM | POA: Diagnosis present

## 2023-05-20 LAB — COMPREHENSIVE METABOLIC PANEL
ALT: 221 U/L — ABNORMAL HIGH (ref 0–44)
AST: 313 U/L — ABNORMAL HIGH (ref 15–41)
Albumin: 4.2 g/dL (ref 3.5–5.0)
Alkaline Phosphatase: 202 U/L — ABNORMAL HIGH (ref 38–126)
Anion gap: 11 (ref 5–15)
BUN: 11 mg/dL (ref 8–23)
CO2: 23 mmol/L (ref 22–32)
Calcium: 8.9 mg/dL (ref 8.9–10.3)
Chloride: 95 mmol/L — ABNORMAL LOW (ref 98–111)
Creatinine, Ser: 0.8 mg/dL (ref 0.61–1.24)
GFR, Estimated: >60 mL/min (ref >60)
Glucose, Bld: 150 mg/dL — ABNORMAL HIGH (ref 70–99)
Potassium: 4.2 mmol/L (ref 3.5–5.1)
Sodium: 129 mmol/L — ABNORMAL LOW (ref 135–145)
Total Bilirubin: 3.4 mg/dL — ABNORMAL HIGH (ref 0.3–1.2)
Total Protein: 8.3 g/dL — ABNORMAL HIGH (ref 6.5–8.1)

## 2023-05-20 LAB — CBC WITH DIFFERENTIAL/PLATELET
Abs Immature Granulocytes: 0.05 10*3/uL (ref 0.00–0.07)
Basophils Absolute: 0 10*3/uL (ref 0.0–0.1)
Basophils Relative: 0 %
Eosinophils Absolute: 0 10*3/uL (ref 0.0–0.5)
Eosinophils Relative: 0 %
HCT: 45.1 % (ref 39.0–52.0)
Hemoglobin: 15.2 g/dL (ref 13.0–17.0)
Immature Granulocytes: 1 %
Lymphocytes Relative: 5 %
Lymphs Abs: 0.5 10*3/uL — ABNORMAL LOW (ref 0.7–4.0)
MCH: 30.4 pg (ref 26.0–34.0)
MCHC: 33.7 g/dL (ref 30.0–36.0)
MCV: 90.2 fL (ref 80.0–100.0)
Monocytes Absolute: 0.8 10*3/uL (ref 0.1–1.0)
Monocytes Relative: 7 %
Neutro Abs: 8.9 10*3/uL — ABNORMAL HIGH (ref 1.7–7.7)
Neutrophils Relative %: 87 %
Platelets: 262 10*3/uL (ref 150–400)
RBC: 5 MIL/uL (ref 4.22–5.81)
RDW: 13.2 % (ref 11.5–15.5)
WBC: 10.2 10*3/uL (ref 4.0–10.5)
nRBC: 0 % (ref 0.0–0.2)

## 2023-05-20 LAB — URINALYSIS, ROUTINE W REFLEX MICROSCOPIC
Bilirubin Urine: NEGATIVE
Glucose, UA: NEGATIVE mg/dL
Ketones, ur: NEGATIVE mg/dL
Leukocytes,Ua: NEGATIVE
Nitrite: NEGATIVE
Protein, ur: NEGATIVE mg/dL
Specific Gravity, Urine: 1.02 (ref 1.005–1.030)
pH: 7 (ref 5.0–8.0)

## 2023-05-20 LAB — URINALYSIS, MICROSCOPIC (REFLEX)

## 2023-05-20 LAB — LACTIC ACID, PLASMA
Lactic Acid, Venous: 2.1 mmol/L (ref 0.5–1.9)
Lactic Acid, Venous: 1.6 mmol/L (ref 0.5–1.9)

## 2023-05-20 LAB — LIPASE, BLOOD: Lipase: 32 U/L (ref 11–51)

## 2023-05-20 MED ORDER — LACTATED RINGERS IV BOLUS
1000.0000 mL | Freq: Once | INTRAVENOUS | Status: AC
  Administered 2023-05-20: 1000 mL via INTRAVENOUS

## 2023-05-20 MED ORDER — MORPHINE SULFATE (PF) 4 MG/ML IV SOLN
4.0000 mg | Freq: Once | INTRAVENOUS | Status: AC
  Administered 2023-05-20: 4 mg via INTRAVENOUS
  Filled 2023-05-20: qty 1

## 2023-05-20 MED ORDER — ONDANSETRON HCL 4 MG/2ML IJ SOLN
4.0000 mg | Freq: Once | INTRAMUSCULAR | Status: AC
  Administered 2023-05-20: 4 mg via INTRAVENOUS
  Filled 2023-05-20: qty 2

## 2023-05-20 MED ORDER — HYDROMORPHONE HCL 1 MG/ML IJ SOLN
1.0000 mg | Freq: Once | INTRAMUSCULAR | Status: AC
  Administered 2023-05-20: 1 mg via INTRAVENOUS
  Filled 2023-05-20: qty 1

## 2023-05-20 MED ORDER — IOHEXOL 300 MG/ML  SOLN
100.0000 mL | Freq: Once | INTRAMUSCULAR | Status: AC | PRN
  Administered 2023-05-20: 100 mL via INTRAVENOUS

## 2023-05-20 MED ORDER — PIPERACILLIN-TAZOBACTAM 3.375 G IVPB 30 MIN
3.3750 g | Freq: Once | INTRAVENOUS | Status: AC
  Administered 2023-05-20: 3.375 g via INTRAVENOUS
  Filled 2023-05-20: qty 50

## 2023-05-20 MED ORDER — SODIUM CHLORIDE 0.9 % IV BOLUS
1000.0000 mL | Freq: Once | INTRAVENOUS | Status: AC
  Administered 2023-05-20: 1000 mL via INTRAVENOUS

## 2023-05-20 NOTE — ED Provider Notes (Signed)
Discussed case with Dr. Loney Loh who will admit     Gailen Shelter, [place] 05/20/23 [id number]    [place], Jonny Ruiz, MD 05/21/23 [phone]

## 2023-05-20 NOTE — ED Notes (Signed)
[facility] called for transport @22 :51

## 2023-05-20 NOTE — ED Provider Notes (Signed)
[facility] Provider Note   CSN: [id number] Arrival date & time: 05/20/23  1729     History  Chief Complaint  Patient presents with   Abdominal Pain    Roberto Sanford is a 67 y.o. male with past medical history significant for hypothyroidism, Afib on chronic anticoagulation with Eliquis, GERD, OSA presents to the ED complaining of severe, generalized abdominal pain that began yesterday morning.  He has associated nausea, but no vomiting or diarrhea.  He has had a lot of belching.  Patient has been drinking water, but has not eaten anything today.  He was evaluated at UC earlier today and given prescription of promethazine, but he has had no relief with this medication.  Denies known fever, but has had chills and felt "hot and cold at the same time".  Denies urinary symptoms, flank pain, abdominal distension, melena, hematochezia, constipation.  Last bowel movement yesterday morning.         Home Medications Prior to Admission medications   Medication Sig Start Date End Date Taking? Authorizing Provider  amitriptyline (ELAVIL) 25 MG tablet Take 25 mg by mouth at bedtime.    [provider]  apixaban (ELIQUIS) 5 MG TABS tablet Take 1 tablet (5 mg total) by mouth 2 (two) times daily. OVERDUE for follow-up, MUST see PROVIDER for FUTURE refills. 05/07/23   Sande Rives, MD  Cholecalciferol 25 MCG (1000 UT) capsule Take by mouth.    [provider]  furosemide (LASIX) 20 MG tablet Take 1 tablet (20 mg total) by mouth as needed. 05/20/22 08/18/22  O'NealRonnald Ramp, MD  levothyroxine (SYNTHROID, LEVOTHROID) 150 MCG tablet Take 150 mcg by mouth daily before breakfast.    [provider]  losartan-hydrochlorothiazide (HYZAAR) 100-12.5 MG tablet Take 1 tablet by mouth daily. 01/06/22   [provider]  meloxicam (MOBIC) 15 MG tablet Take 1 tablet by mouth daily. 05/07/21   [provider]   methylPREDNISolone (MEDROL DOSEPAK) 4 MG TBPK tablet Take 6 tablets on day 1.  Take 5 tablets on day 2.  Take 4 tablets on day 3.  Take 3 tablets on day 4.  Take 2 tablets on day 5.  Take 1 tablet on day 6. 08/28/22   Richardean Sale, DO  Multiple Vitamin (MULTIVITAMIN WITH MINERALS) TABS tablet Take 1 tablet by mouth daily.    [provider]  rosuvastatin (CRESTOR) 10 MG tablet Take 10 mg by mouth daily. 05/29/21   [provider]  TOVIAZ 8 MG TB24 tablet Take 8 mg by mouth daily. 01/01/21   [provider]  zolpidem (AMBIEN) 10 MG tablet Take 10 mg by mouth as needed. 07/04/21   [provider]      Allergies    Patient has no known allergies.    Review of Systems   Review of Systems  Constitutional:  Positive for chills. Negative for fever.  Gastrointestinal:  Positive for abdominal pain and nausea. Negative for abdominal distention, blood in stool, constipation, diarrhea and vomiting.  Genitourinary:  Negative for dysuria and flank pain.    Physical Exam Updated Vital Signs BP (!) 182/122   Pulse 68   Temp 99.9 F (37.7 C)   Resp 17   Ht 5\' 8"  (1.727 m)   Wt 95.3 kg   SpO2 95%   BMI 31.93 kg/m  Physical Exam Vitals and nursing note reviewed.  Constitutional:      General: He is not in acute  distress.    Appearance: He is not ill-appearing.     Comments: Appears uncomfortable  HENT:     Mouth/Throat:     Mouth: Mucous membranes are moist.     Pharynx: Oropharynx is clear.  Cardiovascular:     Rate and Rhythm: Normal rate. Rhythm irregularly irregular.     Pulses: Normal pulses.     Heart sounds: Normal heart sounds.  Pulmonary:     Effort: Pulmonary effort is normal. No respiratory distress.     Breath sounds: Normal breath sounds and air entry.  Abdominal:     General: Abdomen is flat. Bowel sounds are normal. There is no distension.     Palpations: Abdomen is soft.     Tenderness: There is abdominal tenderness in the right  upper quadrant, epigastric area, periumbilical area and left upper quadrant. There is no guarding. Negative signs include Murphy's sign, Rovsing's sign and McBurney's sign.     Hernia: No hernia is present.     Comments: Upper abdomen tympanic with percussion.   Skin:    General: Skin is warm and dry.     Capillary Refill: Capillary refill takes less than 2 seconds.  Neurological:     Mental Status: He is alert. Mental status is at baseline.  Psychiatric:        Mood and Affect: Mood normal.        Behavior: Behavior normal.     ED Results / Procedures / Treatments   Labs (all labs ordered are listed, but only abnormal results are displayed) Labs Reviewed  COMPREHENSIVE METABOLIC PANEL - Abnormal; Notable for the following components:      Result Value   Sodium 129 (*)    Chloride 95 (*)    Glucose, Bld 150 (*)    Total Protein 8.3 (*)    AST 313 (*)    ALT 221 (*)    Alkaline Phosphatase 202 (*)    Total Bilirubin 3.4 (*)    All other components within normal limits  CBC WITH DIFFERENTIAL/PLATELET - Abnormal; Notable for the following components:   Neutro Abs 8.9 (*)    Lymphs Abs 0.5 (*)    All other components within normal limits  URINALYSIS, ROUTINE W REFLEX MICROSCOPIC - Abnormal; Notable for the following components:   Hgb urine dipstick TRACE (*)    All other components within normal limits  LACTIC ACID, PLASMA - Abnormal; Notable for the following components:   Lactic Acid, Venous 2.1 (*)    All other components within normal limits  URINALYSIS, MICROSCOPIC (REFLEX) - Abnormal; Notable for the following components:   Bacteria, UA RARE (*)    All other components within normal limits  CULTURE, BLOOD (ROUTINE X 2)  CULTURE, BLOOD (ROUTINE X 2)  LIPASE, BLOOD  LACTIC ACID, PLASMA    EKG None  Radiology CT ABDOMEN PELVIS W CONTRAST  Result Date: 05/20/2023 CLINICAL DATA:  Abdominal pain, acute, nonlocalized EXAM: CT ABDOMEN AND PELVIS WITH CONTRAST  TECHNIQUE: Multidetector CT imaging of the abdomen and pelvis was performed using the standard protocol following bolus administration of intravenous contrast. RADIATION DOSE REDUCTION: This exam was performed according to the departmental dose-optimization program which includes automated exposure control, adjustment of the mA and/or kV according to patient size and/or use of iterative reconstruction technique. CONTRAST:  OMNIPAQUE IOHEXOL 300 MG/ML  SOLN COMPARISON:  None Available. FINDINGS: Lower chest: No acute abnormality Hepatobiliary: Gallbladder is distended. Layering small stones within the gallbladder. There is gallbladder wall  thickening and pericholecystic fluid. There is concern for possible small stones within the common bile duct. Mild intrahepatic biliary ductal dilatation. No focal hepatic abnormality. Pancreas: No focal abnormality or ductal dilatation. Spleen: No focal abnormality.  Normal size. Adrenals/Urinary Tract: No adrenal abnormality. No focal renal abnormality. No stones or hydronephrosis. Urinary bladder is unremarkable. Stomach/Bowel: Stomach, Sigmoid diverticulosis. No active diverticulitis. Stomach and small bowel decompressed, unremarkable. Vascular/Lymphatic: Aortic atherosclerosis. No evidence of aneurysm or adenopathy. Reproductive: Prostate enlargement. Other: No free fluid or free air. Musculoskeletal: No acute bony abnormality. IMPRESSION: Gallbladder distension with gallbladder wall thickening, pericholecystic fluid and layering stones. Findings concerning for acute cholecystitis. There appears to be high density material within the common bile duct concerning for ductal stones. This could be further evaluated with right upper quadrant ultrasound if felt clinically indicated. Aortic atherosclerosis. Prostate enlargement. Electronically Signed   By: Charlett Nose M.D.   On: 05/20/2023 19:53    Procedures Procedures    Medications Ordered in ED Medications  sodium  chloride 0.9 % bolus 1,000 mL (0 mLs Intravenous Stopped 05/20/23 1933)  morphine (PF) 4 MG/ML injection 4 mg (4 mg Intravenous Given 05/20/23 1807)  ondansetron (ZOFRAN) injection 4 mg (4 mg Intravenous Given 05/20/23 1805)  iohexol (OMNIPAQUE) 300 MG/ML solution 100 mL (100 mLs Intravenous Contrast Given 05/20/23 1842)  morphine (PF) 4 MG/ML injection 4 mg (4 mg Intravenous Given 05/20/23 1933)  lactated ringers bolus 1,000 mL (1,000 mLs Intravenous New Bag/Given 05/20/23 1944)  ondansetron (ZOFRAN) injection 4 mg (4 mg Intravenous Given 05/20/23 2003)  piperacillin-tazobactam (ZOSYN) IVPB 3.375 g (0 g Intravenous Stopped 05/20/23 2118)  HYDROmorphone (DILAUDID) injection 1 mg (1 mg Intravenous Given 05/20/23 2040)    ED Course/ Medical Decision Making/ A&P Clinical Course as of 05/20/23 2138  Tue May 20, 2023  155 67 year old male w acute cholecytitis.  Pain since yesterday AM constant since. N but no V or D.  Last eliquis this AM Dr. Doylene Canard either hospital.  [WF]    Clinical Course User Index [WF] Gailen Shelter, PA                             Medical Decision Making Amount and/or Complexity of Data Reviewed Labs: ordered.  Risk Prescription drug management.   This patient presents to the ED with chief complaint(s) of severe abdominal pain with pertinent past medical history of hypothyroidism, Afib anticoagulated with Eliquis, prior hernia surgery.  The complaint involves an extensive differential diagnosis and also carries with it a high risk of complications and morbidity.    The differential diagnosis includes SBO, LBO, pancreatitis, colitis, gastritis, gastroenteritis, diverticulitis, appendicitis, cholecystitis, symptomatic cholelithiasis/biliary colic   The initial plan is to obtain abdominal pain workup including lactic due to patient being tachycardic and borderline tachypneic and febrile  Initial Assessment:   Exam significant for tenderness to palpation of upper  abdominal quadrants and periumbilical region.  Abdomen is soft and non distended.  Bowel sounds are normal.  Upper quadrants are tympanic with percussion.  Negative Murphy's sign, Rovsing's sign, McBurney's sign.  No guarding.  Patient appears very uncomfortable.  He is not actively vomiting.  Skin is warm and dry.   Independent ECG/labs interpretation:  The following labs were independently interpreted:  CBC without leukocytosis or anemia.  Metabolic panel with elevated LFTs and bilirubin, hyponatremia.  Initial lactic 2.1.  Lipase normal.    Independent visualization and interpretation of imaging: I  independently visualized the following imaging with scope of interpretation limited to determining acute life threatening conditions related to emergency care: CT abdomen pelvis, which revealed acute cholecystitis.    Treatment and Reassessment: Patient given IV fluids, morphine, and Zofran.  Patient continues to have pain despite morphine, will try Dilaudid.   Will give patient IV Zosyn for acute cholecystitis and continue IV fluids.  Patient re-evaluated after Dilaudid, he had improvement in pain.  He is sitting comfortably in bed.    Consultations obtained:   I requested consultation with general surgery.  Attending, Dr. Rubin Payor, spoke with Dr. Fredricka Bonine who recommended medicine admission.  Eliquis will need to be held.  Dr. Fredricka Bonine did not have hospital preference.    Disposition:   Care of patient transferred to [facility], PA-C at end of shift.  Plan at time of hand-off is to admit patient to hospital and continue symptomatic management.            Final Clinical Impression(s) / ED Diagnoses Final diagnoses:  Acute cholecystitis    Rx / DC Orders ED Discharge Orders     None         Barrie Lyme 05/20/23 2138    Benjiman Core, MD 05/20/23 2329

## 2023-05-20 NOTE — Progress Notes (Signed)
Plan of Care Note for accepted transfer   Patient: Roberto Sanford MRN: [id number]   DOA: 05/20/2023  Facility requesting transfer: [facility] Requesting Provider: Solon Augusta PA Reason for transfer: Acute calculus cholecystitis Facility course: 67 year old male with a past medical history of A-fib on Eliquis, GERD, hypothyroidism, OSA on CPAP, anxiety presented to ED with complaints of generalized abdominal pain and nausea.  Vital signs on arrival: Temperature 99.9 F, pulse 111, respiratory rate 20, blood pressure 170/108, SpO2 100% on room air.  Labs showing WBC 10.2, hemoglobin 15.2, sodium 129, AST 313, ALT 221, alk phos 202, T. bili 3.4, lipase normal, lactic acid 2.1> 1.6, blood cultures collected, UA not suggestive of infection.  CT abdomen pelvis showing gallbladder distention and gallbladder wall thickening and pericholecystic fluid and layering stones, findings concerning for acute cholecystitis.  There is high density material within the CBD concerning for ductal stones.  ED provider discussed the case with Dr. Fredricka Bonine who requested admission by medicine service, holding Eliquis, and starting Zosyn.  General surgery will consult.  Patient was given Zofran, Zosyn, 2 L IV fluid boluses, and IV analgesics including Dilaudid and morphine.  Plan of care: The patient is accepted for admission to Telemetry unit, at [facility].   [facility] will assume care on arrival to accepting facility. Until arrival, care as per EDP. However, [facility] available 24/7 for questions and assistance.    Author: John Giovanni, MD 05/20/2023  Check [email or web address] for on-call coverage.  Nursing staff, Please call [facility] Admits & Consults System-Wide number on Amion as soon as patient's arrival, so appropriate admitting provider can evaluate the pt.

## 2023-05-20 NOTE — ED Triage Notes (Signed)
Pt states severe generalized abd pain that started yesterday, reports nausea but no vomiting States lots of belching  Denies diarrhea   Was seen at [facility] and given promethazine today but no relief

## 2023-05-21 ENCOUNTER — Encounter (HOSPITAL_COMMUNITY): Payer: Self-pay | Admitting: Internal Medicine

## 2023-05-21 ENCOUNTER — Inpatient Hospital Stay (HOSPITAL_COMMUNITY): Payer: PPO

## 2023-05-21 ENCOUNTER — Ambulatory Visit: Payer: PPO | Admitting: Nurse Practitioner

## 2023-05-21 DIAGNOSIS — I4821 Permanent atrial fibrillation: Secondary | ICD-10-CM | POA: Diagnosis not present

## 2023-05-21 DIAGNOSIS — K8000 Calculus of gallbladder with acute cholecystitis without obstruction: Secondary | ICD-10-CM

## 2023-05-21 DIAGNOSIS — Z79899 Other long term (current) drug therapy: Secondary | ICD-10-CM | POA: Diagnosis not present

## 2023-05-21 DIAGNOSIS — N3281 Overactive bladder: Secondary | ICD-10-CM | POA: Diagnosis not present

## 2023-05-21 DIAGNOSIS — R748 Abnormal levels of other serum enzymes: Secondary | ICD-10-CM | POA: Diagnosis not present

## 2023-05-21 DIAGNOSIS — Z7901 Long term (current) use of anticoagulants: Secondary | ICD-10-CM | POA: Diagnosis not present

## 2023-05-21 DIAGNOSIS — K819 Cholecystitis, unspecified: Secondary | ICD-10-CM | POA: Diagnosis not present

## 2023-05-21 DIAGNOSIS — E871 Hypo-osmolality and hyponatremia: Secondary | ICD-10-CM | POA: Diagnosis not present

## 2023-05-21 DIAGNOSIS — K8051 Calculus of bile duct without cholangitis or cholecystitis with obstruction: Secondary | ICD-10-CM | POA: Diagnosis not present

## 2023-05-21 DIAGNOSIS — E785 Hyperlipidemia, unspecified: Secondary | ICD-10-CM | POA: Diagnosis not present

## 2023-05-21 DIAGNOSIS — I4891 Unspecified atrial fibrillation: Secondary | ICD-10-CM | POA: Diagnosis not present

## 2023-05-21 DIAGNOSIS — F32A Depression, unspecified: Secondary | ICD-10-CM | POA: Diagnosis not present

## 2023-05-21 DIAGNOSIS — K8063 Calculus of gallbladder and bile duct with acute cholecystitis with obstruction: Secondary | ICD-10-CM | POA: Diagnosis not present

## 2023-05-21 DIAGNOSIS — E039 Hypothyroidism, unspecified: Secondary | ICD-10-CM | POA: Diagnosis not present

## 2023-05-21 DIAGNOSIS — G4730 Sleep apnea, unspecified: Secondary | ICD-10-CM | POA: Diagnosis not present

## 2023-05-21 DIAGNOSIS — K8050 Calculus of bile duct without cholangitis or cholecystitis without obstruction: Secondary | ICD-10-CM | POA: Diagnosis not present

## 2023-05-21 DIAGNOSIS — R935 Abnormal findings on diagnostic imaging of other abdominal regions, including retroperitoneum: Secondary | ICD-10-CM | POA: Diagnosis not present

## 2023-05-21 DIAGNOSIS — K810 Acute cholecystitis: Secondary | ICD-10-CM | POA: Diagnosis not present

## 2023-05-21 DIAGNOSIS — K8040 Calculus of bile duct with cholecystitis, unspecified, without obstruction: Secondary | ICD-10-CM | POA: Diagnosis not present

## 2023-05-21 DIAGNOSIS — M1990 Unspecified osteoarthritis, unspecified site: Secondary | ICD-10-CM | POA: Diagnosis not present

## 2023-05-21 DIAGNOSIS — K8020 Calculus of gallbladder without cholecystitis without obstruction: Secondary | ICD-10-CM | POA: Diagnosis not present

## 2023-05-21 DIAGNOSIS — Z9989 Dependence on other enabling machines and devices: Secondary | ICD-10-CM | POA: Diagnosis not present

## 2023-05-21 DIAGNOSIS — R933 Abnormal findings on diagnostic imaging of other parts of digestive tract: Secondary | ICD-10-CM | POA: Diagnosis not present

## 2023-05-21 DIAGNOSIS — Z79890 Hormone replacement therapy (postmenopausal): Secondary | ICD-10-CM | POA: Diagnosis not present

## 2023-05-21 DIAGNOSIS — K838 Other specified diseases of biliary tract: Secondary | ICD-10-CM | POA: Diagnosis not present

## 2023-05-21 DIAGNOSIS — R932 Abnormal findings on diagnostic imaging of liver and biliary tract: Secondary | ICD-10-CM | POA: Diagnosis not present

## 2023-05-21 DIAGNOSIS — I10 Essential (primary) hypertension: Secondary | ICD-10-CM | POA: Diagnosis not present

## 2023-05-21 DIAGNOSIS — G4733 Obstructive sleep apnea (adult) (pediatric): Secondary | ICD-10-CM | POA: Diagnosis not present

## 2023-05-21 DIAGNOSIS — R739 Hyperglycemia, unspecified: Secondary | ICD-10-CM | POA: Diagnosis not present

## 2023-05-21 DIAGNOSIS — K219 Gastro-esophageal reflux disease without esophagitis: Secondary | ICD-10-CM | POA: Diagnosis not present

## 2023-05-21 DIAGNOSIS — F419 Anxiety disorder, unspecified: Secondary | ICD-10-CM | POA: Diagnosis not present

## 2023-05-21 DIAGNOSIS — R140 Abdominal distension (gaseous): Secondary | ICD-10-CM | POA: Diagnosis not present

## 2023-05-21 DIAGNOSIS — E861 Hypovolemia: Secondary | ICD-10-CM | POA: Diagnosis not present

## 2023-05-21 LAB — PHOSPHORUS: Phosphorus: 3.8 mg/dL (ref 2.5–4.6)

## 2023-05-21 LAB — COMPREHENSIVE METABOLIC PANEL
ALT: 224 U/L — ABNORMAL HIGH (ref 0–44)
AST: 201 U/L — ABNORMAL HIGH (ref 15–41)
Albumin: 3.7 g/dL (ref 3.5–5.0)
Alkaline Phosphatase: 238 U/L — ABNORMAL HIGH (ref 38–126)
Anion gap: 9 (ref 5–15)
BUN: 10 mg/dL (ref 8–23)
CO2: 26 mmol/L (ref 22–32)
Calcium: 8.8 mg/dL — ABNORMAL LOW (ref 8.9–10.3)
Chloride: 97 mmol/L — ABNORMAL LOW (ref 98–111)
Creatinine, Ser: 0.82 mg/dL (ref 0.61–1.24)
GFR, Estimated: >60 mL/min (ref >60)
Glucose, Bld: 105 mg/dL — ABNORMAL HIGH (ref 70–99)
Potassium: 4.3 mmol/L (ref 3.5–5.1)
Sodium: 132 mmol/L — ABNORMAL LOW (ref 135–145)
Total Bilirubin: 8 mg/dL — ABNORMAL HIGH (ref 0.3–1.2)
Total Protein: 7.4 g/dL (ref 6.5–8.1)

## 2023-05-21 LAB — CBC
HCT: 47.1 % (ref 39.0–52.0)
Hemoglobin: 15.5 g/dL (ref 13.0–17.0)
MCH: 29.9 pg (ref 26.0–34.0)
MCHC: 32.9 g/dL (ref 30.0–36.0)
MCV: 90.9 fL (ref 80.0–100.0)
Platelets: 247 10*3/uL (ref 150–400)
RBC: 5.18 MIL/uL (ref 4.22–5.81)
RDW: 13.4 % (ref 11.5–15.5)
WBC: 9.2 10*3/uL (ref 4.0–10.5)
nRBC: 0 % (ref 0.0–0.2)

## 2023-05-21 LAB — HIV ANTIBODY (ROUTINE TESTING W REFLEX): HIV Screen 4th Generation wRfx: NONREACTIVE

## 2023-05-21 LAB — GLUCOSE, CAPILLARY
Glucose-Capillary: 100 mg/dL — ABNORMAL HIGH (ref 70–99)
Glucose-Capillary: 124 mg/dL — ABNORMAL HIGH (ref 70–99)
Glucose-Capillary: 93 mg/dL (ref 70–99)
Glucose-Capillary: 140 mg/dL — ABNORMAL HIGH (ref 70–99)

## 2023-05-21 LAB — HEPARIN LEVEL (UNFRACTIONATED)
Heparin Unfractionated: 0.13 IU/mL — ABNORMAL LOW (ref 0.30–0.70)
Heparin Unfractionated: 0.41 IU/mL (ref 0.30–0.70)
Heparin Unfractionated: 0.46 IU/mL (ref 0.30–0.70)

## 2023-05-21 LAB — MAGNESIUM: Magnesium: 2.2 mg/dL (ref 1.7–2.4)

## 2023-05-21 LAB — APTT
aPTT: 36 seconds (ref 24–36)
aPTT: 71 seconds — ABNORMAL HIGH (ref 24–36)

## 2023-05-21 LAB — HEMOGLOBIN A1C
Hgb A1c MFr Bld: 5 % (ref 4.8–5.6)
Mean Plasma Glucose: 96.8 mg/dL

## 2023-05-21 MED ORDER — PROCHLORPERAZINE EDISYLATE 10 MG/2ML IJ SOLN: 5.0000 mg | Freq: Four times a day (QID) | INTRAMUSCULAR | Status: DC | PRN

## 2023-05-21 MED ORDER — OXYCODONE HCL 5 MG PO TABS
5.0000 mg | ORAL_TABLET | Freq: Four times a day (QID) | ORAL | Status: DC | PRN
  Filled 2023-05-21: qty 1

## 2023-05-21 MED ORDER — INSULIN ASPART 100 UNIT/ML IJ SOLN
0.0000 [IU] | INTRAMUSCULAR | Status: DC
  Administered 2023-05-21 – 2023-05-22 (×2): 1 [IU] via SUBCUTANEOUS
  Administered 2023-05-22 – 2023-05-23 (×2): 2 [IU] via SUBCUTANEOUS
  Administered 2023-05-23 (×3): 1 [IU] via SUBCUTANEOUS

## 2023-05-21 MED ORDER — POLYETHYLENE GLYCOL 3350 17 G PO PACK: 17.0000 g | PACK | Freq: Every day | ORAL | Status: DC | PRN

## 2023-05-21 MED ORDER — LABETALOL HCL 5 MG/ML IV SOLN: 5.0000 mg | INTRAVENOUS | Status: DC | PRN

## 2023-05-21 MED ORDER — SODIUM CHLORIDE 0.9 % IV SOLN: INTRAVENOUS | Status: DC

## 2023-05-21 MED ORDER — SODIUM CHLORIDE 0.9 % IV SOLN: INTRAVENOUS | Status: AC

## 2023-05-21 MED ORDER — ALPRAZOLAM 0.5 MG PO TABS
0.5000 mg | ORAL_TABLET | Freq: Once | ORAL | Status: AC | PRN
  Administered 2023-05-21: 0.5 mg via ORAL
  Filled 2023-05-21: qty 1

## 2023-05-21 MED ORDER — GADOBUTROL 1 MMOL/ML IV SOLN: 10.0000 mL | Freq: Once | INTRAVENOUS | Status: DC | PRN

## 2023-05-21 MED ORDER — MELATONIN 5 MG PO TABS
5.0000 mg | ORAL_TABLET | Freq: Every evening | ORAL | Status: DC | PRN
  Administered 2023-05-22: 5 mg via ORAL
  Filled 2023-05-21: qty 1

## 2023-05-21 MED ORDER — HEPARIN (PORCINE) 25000 UT/250ML-% IV SOLN
1350.0000 [IU]/h | INTRAVENOUS | Status: AC
  Administered 2023-05-21 (×2): 1350 [IU]/h via INTRAVENOUS
  Filled 2023-05-21 (×2): qty 250

## 2023-05-21 MED ORDER — PIPERACILLIN-TAZOBACTAM 3.375 G IVPB
3.3750 g | Freq: Three times a day (TID) | INTRAVENOUS | Status: DC
  Administered 2023-05-21 – 2023-05-26 (×16): 3.375 g via INTRAVENOUS
  Filled 2023-05-21 (×16): qty 50

## 2023-05-21 MED ORDER — ROSUVASTATIN CALCIUM 10 MG PO TABS
10.0000 mg | ORAL_TABLET | Freq: Every day | ORAL | Status: DC
  Administered 2023-05-21 – 2023-05-26 (×4): 10 mg via ORAL
  Filled 2023-05-21 (×4): qty 1

## 2023-05-21 MED ORDER — HYDROMORPHONE HCL 1 MG/ML IJ SOLN
0.5000 mg | INTRAMUSCULAR | Status: DC | PRN
  Administered 2023-05-21 (×3): 0.5 mg via INTRAVENOUS
  Filled 2023-05-21 (×3): qty 0.5

## 2023-05-21 MED ORDER — LEVOTHYROXINE SODIUM 100 MCG PO TABS
100.0000 ug | ORAL_TABLET | Freq: Every day | ORAL | Status: DC
  Administered 2023-05-24 – 2023-05-26 (×3): 100 ug via ORAL
  Filled 2023-05-21 (×5): qty 1

## 2023-05-21 MED ORDER — ADULT MULTIVITAMIN W/MINERALS CH
1.0000 | ORAL_TABLET | Freq: Every day | ORAL | Status: DC
  Administered 2023-05-21 – 2023-05-26 (×4): 1 via ORAL
  Filled 2023-05-21 (×4): qty 1

## 2023-05-21 MED ORDER — GADOBUTROL 1 MMOL/ML IV SOLN
10.0000 mL | Freq: Once | INTRAVENOUS | Status: AC | PRN
  Administered 2023-05-21: 10 mL via INTRAVENOUS

## 2023-05-21 MED ORDER — AMITRIPTYLINE HCL 25 MG PO TABS
25.0000 mg | ORAL_TABLET | Freq: Every day | ORAL | Status: DC
  Administered 2023-05-21 – 2023-05-25 (×5): 25 mg via ORAL
  Filled 2023-05-21 (×7): qty 1

## 2023-05-21 NOTE — Anesthesia Preprocedure Evaluation (Addendum)
Anesthesia Evaluation  Patient identified by MRN, date of birth, ID band Patient awake    Reviewed: Allergy & Precautions, NPO status , Patient's Chart, lab work & pertinent test results  History of Anesthesia Complications Negative for: history of anesthetic complications  Airway Mallampati: III       Dental  (+) Dental Advisory Given   Pulmonary neg shortness of breath, sleep apnea and Continuous Positive Airway Pressure Ventilation , neg COPD, neg recent URI   Pulmonary exam normal breath sounds clear to auscultation       Cardiovascular hypertension, Pt. on medications (-) angina (-) Past MI, (-) Cardiac Stents and (-) CABG + dysrhythmias Atrial Fibrillation  Rhythm:Irregular Rate:Normal  HLD   Neuro/Psych neg Seizures PSYCHIATRIC DISORDERS Anxiety     negative neurological ROS     GI/Hepatic Neg liver ROS,GERD  ,,choledocholithiasis   Endo/Other  neg diabetesHypothyroidism    Renal/GU negative Renal ROS Bladder dysfunction      Musculoskeletal  (+) Arthritis ,    Abdominal   Peds  Hematology negative hematology ROS (+)   Anesthesia Other Findings 67 y.o. male with medical history significant for anxiety, arthritis, atrial fibrillation on Eliquis, GERD, hypothyroidism, overactive bladder, OSA on CPAP who presented to [facility] ED 6/11 with abdominal pain.   Last Eliquis: 05/19/2023  Heparin gtt stopped at 10:12 am  Reproductive/Obstetrics                             Anesthesia Physical Anesthesia Plan  ASA: 3  Anesthesia Plan: General   Post-op Pain Management: Minimal or no pain anticipated   Induction: Intravenous  PONV Risk Score and Plan: 2 and Ondansetron, Dexamethasone and Treatment may vary due to age or medical condition  Airway Management Planned: Oral ETT  Additional Equipment:   Intra-op Plan:   Post-operative Plan: Extubation in OR  Informed Consent:  I have reviewed the patients History and Physical, chart, labs and discussed the procedure including the risks, benefits and alternatives for the proposed anesthesia with the patient or authorized representative who has indicated his/her understanding and acceptance.     Dental advisory given  Plan Discussed with: CRNA and Anesthesiologist  Anesthesia Plan Comments: (Risks of general anesthesia discussed including, but not limited to, sore throat, hoarse voice, chipped/damaged teeth, injury to vocal cords, nausea and vomiting, allergic reactions, lung infection, heart attack, stroke, and death. All questions answered. )        Anesthesia Quick Evaluation

## 2023-05-21 NOTE — Progress Notes (Signed)
Pt placed on CPAP and tolerating well.  

## 2023-05-21 NOTE — Progress Notes (Addendum)
ANTICOAGULATION CONSULT NOTE - Follow Up Consult  Pharmacy Consult for Heparin Indication: atrial fibrillation  No Known Allergies  Patient Measurements: Height: 5\' 8"  (172.7 cm) Weight: 93.2 kg (205 lb 8 oz) IBW/kg (Calculated) : 68.4 Heparin Dosing Weight: 88kg  Vital Signs: Temp: 98.4 F (36.9 C) (06/12 2003) Temp Source: Oral (06/12 2003) BP: 124/85 (06/12 2003) Pulse Rate: 100 (06/12 2003)  Labs: Recent Labs    05/20/23 1759 05/21/23 0642 05/21/23 1242 05/21/23 1826  HGB 15.2 15.5  --   --   HCT 45.1 47.1  --   --   PLT 262 247  --   --   APTT  --  36 71*  --   HEPARINUNFRC  --  0.13* 0.41 0.46  CREATININE 0.80 0.82  --   --      Estimated Creatinine Clearance: 98.1 mL/min (by C-G formula based on SCr of 0.82 mg/dL).   Medications:  Infusions:   sodium chloride 75 mL/hr at 05/21/23 1508   heparin 1,350 Units/hr (05/21/23 1610)   piperacillin-tazobactam 3.375 g (05/21/23 1348)    Assessment: 66 yoM admtted on 6/11 with severe generalized abdominal pain. CT shows acute cholecystitis and possible choledocholithiasis. PMH of Afib on chronic Eliquis, hypothyroidism, GERD, chronic anxiety/depression, OSA on CPAP.  Pharmacy is consulted to dose heparin while Eliquis is on hold for possible procedures.    PTA Eliquis 5mg  PO BID.  Last dose reportedly on 05/19/23 AM.    Today, 05/21/2023: Heparin level remains therapeutic and stable  on heparin 1350 units/hr Note recent DOAC will falsely elevate anti-Xa levels, while hyperbilirubinemia can falsely decrease level. Difficult to say if either currently affecting results, since opposing effects would tend to cancel each other out. Either way, therapeutic aPTT from earlier today suggests current heparin levels are reasonably accurate CBC: Hgb and Plt stable/WNL SCr stable, at baseline  Goal of Therapy:  Heparin level 0.3-0.7 units/ml aPTT 66-102 seconds Monitor platelets by anticoagulation protocol: Yes   Plan:   Continue heparin IV infusion at 1350 units/hr Daily CBC and heparin level; given multiple potential confounders, will add daily aPTT for now Anticipate will need ERCP & possible cholecystectomy; GI notes recommend stopping heparin 4 hrs prior to ERCP, but no scheduled procedure time as of now  Bernadene Person, PharmD, BCPS [phone] 05/21/2023, 8:39 PM

## 2023-05-21 NOTE — Consult Note (Signed)
Reason for Consult: Cholelithiasis. Referring Physician: [facility]  Roberto Sanford is an 67 y.o. male.  HPI: Patient is a 67 year old white male with multiple medical problems below listed below who presented to the emergency room with acute abdominal pain that started after he had beef stroganoff for dinner that night the pain was intense and caused severe nausea but he did not vomit. He claims he had similar symptoms a couple of times in the past. He denied having any fevers or chills.  He was found to be hypertensive and tachycardic in the ER and a CT scan of the abdomen pelvis revealed acute cholecystitis with possible choledocholithiasis. He was started on IV Zosyn.  MRCP done earlier today showed acute cholecystitis with small nonobstructing distal common bile duct stones and possible cystic duct stones with small amount of fluid surrounding the pancreas in the right anterior pararenal space likely related to gallbladder pathology. There was no evidence of acute pancreatitis. His lipase level is normal.  He is on Eliquis at home for atrial fibrillation and the last dose was 2 days ago.  He is currently on a heparin drip.  His total bili was 3.4 with a AST of 313 with ALT of 221 and alkaline phosphatase of 202.  Past Medical History:  Diagnosis Date   Anxiety    Arrhythmia    Arthritis    Atrial fibrillation (HCC)    GERD (gastroesophageal reflux disease)    Hypothyroidism    Overactive bladder    takes vesicare and elavil   Sleep apnea    uses CPAP nightly   Past Surgical History:  Procedure Laterality Date   CARPAL TUNNEL RELEASE Right 04/27/2015   Procedure: CARPAL TUNNEL RELEASE RIGHT ;  Surgeon: Cindee Salt, MD;  Location: [facility];  Service: Orthopedics;  Laterality: Right;   CARPAL TUNNEL RELEASE Left 05/18/2015   Procedure: LEFT CARPAL TUNNEL RELEASE;  Surgeon: Cindee Salt, MD;  Location: [facility];  Service: Orthopedics;  Laterality: Left;    HERNIA REPAIR     KNEE ARTHROSCOPY Left    TRIGGER FINGER RELEASE Right 04/27/2015   Procedure: RELEASE A-1 PULLEY PULLEY RIGHT MIDDLE FINGER, RELEASE A-1 PULLEY RIGHT RING FINGER ;  Surgeon: Cindee Salt, MD;  Location: [facility];  Service: Orthopedics;  Laterality: Right;   Family History  Problem Relation Age of Onset   Diabetes Mother    Cancer Father    Social History:  reports that he has never smoked. He has never used smokeless tobacco. He reports that he does not drink alcohol and does not use drugs.  He is a retired [employer].  Allergies: No Known Allergies  Medications: I have reviewed the patient's current medications. Prior to Admission:  Medications Prior to Admission  Medication Sig Dispense Refill Last Dose   amitriptyline (ELAVIL) 25 MG tablet Take 25 mg by mouth at bedtime.   Past Week   Cholecalciferol 25 MCG (1000 UT) capsule Take 1,000 Units by mouth daily.   Past Month   furosemide (LASIX) 20 MG tablet Take 1 tablet (20 mg total) by mouth as needed. 90 tablet 3 Past Week   levothyroxine (SYNTHROID) 100 MCG tablet Take 100 mcg by mouth daily before breakfast.   Past Week   losartan-hydrochlorothiazide (HYZAAR) 100-12.5 MG tablet Take 1 tablet by mouth daily.   Past Week   Multiple Vitamin (MULTIVITAMIN WITH MINERALS) TABS tablet Take 1 tablet by mouth daily.   Past Month  oxybutynin (DITROPAN-XL) 5 MG 24 hr tablet Take 5 mg by mouth at bedtime.   Past Week   rosuvastatin (CRESTOR) 10 MG tablet Take 10 mg by mouth daily.   Past Week   zolpidem (AMBIEN) 10 MG tablet Take 10 mg by mouth as needed for sleep.   Past Week   apixaban (ELIQUIS) 5 MG TABS tablet Take 1 tablet (5 mg total) by mouth 2 (two) times daily. OVERDUE for follow-up, MUST see PROVIDER for FUTURE refills. (Patient taking differently: Take 5 mg by mouth 2 (two) times daily.) 60 tablet 2 05/19/2023 at 0800   meloxicam (MOBIC) 15 MG tablet Take 1 tablet by mouth daily. (Patient not taking:  Reported on 05/21/2023)   Not Taking   methylPREDNISolone (MEDROL DOSEPAK) 4 MG TBPK tablet Take 6 tablets on day 1.  Take 5 tablets on day 2.  Take 4 tablets on day 3.  Take 3 tablets on day 4.  Take 2 tablets on day 5.  Take 1 tablet on day 6. (Patient not taking: Reported on 05/21/2023) 21 tablet 0 Completed Course   Scheduled:  amitriptyline  25 mg Oral QHS   insulin aspart  0-9 Units Subcutaneous Q4H   levothyroxine  100 mcg Oral Q0600   multivitamin with minerals  1 tablet Oral Daily   rosuvastatin  10 mg Oral Daily   Continuous:  sodium chloride 75 mL/hr at 05/21/23 1508   heparin 1,350 Units/hr (05/21/23 1478)   piperacillin-tazobactam 3.375 g (05/21/23 1348)   GNF:AOZHYQMVHQION (DILAUDID) injection, labetalol, melatonin, oxyCODONE, polyethylene glycol, prochlorperazine  Results for orders placed or performed during the hospital encounter of 05/20/23 (from the past 48 hour(s))  Lactic acid, plasma     Status: Abnormal   Collection Time: 05/20/23  5:45 PM  Result Value Ref Range   Lactic Acid, Venous 2.1 (HH) 0.5 - 1.9 mmol/L    Comment: CRITICAL RESULT CALLED TO, READ BACK BY AND VERIFIED WITH ALEXANDRA NOAH RN AT [phone] ON 05/20/23 BY I.[email or web address] Performed at [facility], [address] [place], [place] [address]   Comprehensive metabolic panel     Status: Abnormal   Collection Time: 05/20/23  5:59 PM  Result Value Ref Range   Sodium 129 (L) 135 - 145 mmol/L   Potassium 4.2 3.5 - 5.1 mmol/L   Chloride 95 (L) 98 - 111 mmol/L   CO2 23 22 - 32 mmol/L   Glucose, Bld 150 (H) 70 - 99 mg/dL    Comment: Glucose reference range applies only to samples taken after fasting for at least 8 hours.   BUN 11 8 - 23 mg/dL   Creatinine, Ser 2.44 0.61 - 1.24 mg/dL   Calcium 8.9 8.9 - 01.0 mg/dL   Total Protein 8.3 (H) 6.5 - 8.1 g/dL   Albumin 4.2 3.5 - 5.0 g/dL   AST 272 (H) 15 - 41 U/L   ALT 221 (H) 0 - 44 U/L   Alkaline Phosphatase 202 (H) 38 - 126 U/L   Total Bilirubin 3.4 (H)  0.3 - 1.2 mg/dL   GFR, Estimated >53 >66 mL/min    Comment: (NOTE) Calculated using the CKD-EPI Creatinine Equation (2021)    Anion gap 11 5 - 15    Comment: Performed at [facility], [address] [place], [place] [address]  Lipase, blood     Status: None   Collection Time: 05/20/23  5:59 PM  Result Value Ref Range   Lipase 32 11 -  51 U/L    Comment: Performed at [facility], [address] [place], [place] [address]  CBC with Diff     Status: Abnormal   Collection Time: 05/20/23  5:59 PM  Result Value Ref Range   WBC 10.2 4.0 - 10.5 K/uL   RBC 5.00 4.22 - 5.81 MIL/uL   Hemoglobin 15.2 13.0 - 17.0 g/dL   HCT 78.4 69.6 - 29.5 %   MCV 90.2 80.0 - 100.0 fL   MCH 30.4 26.0 - 34.0 pg   MCHC 33.7 30.0 - 36.0 g/dL   RDW 28.4 13.2 - 44.0 %   Platelets 262 150 - 400 K/uL   nRBC 0.0 0.0 - 0.2 %   Neutrophils Relative % 87 %   Neutro Abs 8.9 (H) 1.7 - 7.7 K/uL   Lymphocytes Relative 5 %   Lymphs Abs 0.5 (L) 0.7 - 4.0 K/uL   Monocytes Relative 7 %   Monocytes Absolute 0.8 0.1 - 1.0 K/uL   Eosinophils Relative 0 %   Eosinophils Absolute 0.0 0.0 - 0.5 K/uL   Basophils Relative 0 %   Basophils Absolute 0.0 0.0 - 0.1 K/uL   Immature Granulocytes 1 %   Abs Immature Granulocytes 0.05 0.00 - 0.07 K/uL    Comment: Performed at [facility], [address] [place], [place] [address]  Blood culture (routine x 2)     Status: None (Preliminary result)   Collection Time: 05/20/23  7:25 PM   Specimen: BLOOD  Result Value Ref Range   Specimen Description      BLOOD LEFT ANTECUBITAL Performed at [facility], [address] [place], [place] [address]    Special Requests      BOTTLES DRAWN AEROBIC AND ANAEROBIC Blood Culture adequate volume Performed at [facility], [address] [place], [place] [address]    Culture      NO GROWTH < 12 HOURS Performed at [facility], [address] [place], [place] [address]    Report  Status PENDING   Blood culture (routine x 2)     Status: None (Preliminary result)   Collection Time: 05/20/23  7:30 PM   Specimen: BLOOD  Result Value Ref Range   Specimen Description      BLOOD RIGHT ANTECUBITAL Performed at [facility], [address] [place], [place] [address]    Special Requests      BOTTLES DRAWN AEROBIC AND ANAEROBIC Blood Culture adequate volume Performed at [facility], [address] [place], [place] [address]    Culture      NO GROWTH < 12 HOURS Performed at [facility], [address], [place], [place] [address]    Report Status PENDING   Urinalysis, Routine w reflex microscopic -Urine, Clean Catch     Status: Abnormal   Collection Time: 05/20/23  7:48 PM  Result Value Ref Range   Color, Urine YELLOW YELLOW   APPearance CLEAR CLEAR   Specific Gravity, Urine 1.020 1.005 - 1.030   pH 7.0 5.0 - 8.0   Glucose, UA NEGATIVE NEGATIVE mg/dL   Hgb urine dipstick TRACE (A) NEGATIVE   Bilirubin Urine NEGATIVE NEGATIVE   Ketones, ur NEGATIVE NEGATIVE mg/dL   Protein, ur NEGATIVE NEGATIVE mg/dL   Nitrite NEGATIVE NEGATIVE   Leukocytes,Ua NEGATIVE NEGATIVE    Comment: Performed at [facility], [address] [place],  [place] [address]  Urinalysis, Microscopic (reflex)     Status: Abnormal   Collection Time: 05/20/23  7:48 PM  Result Value Ref Range   RBC / HPF 0-5 0 - 5 RBC/hpf   WBC, UA 0-5 0 - 5 WBC/hpf   Bacteria, UA RARE (A) NONE SEEN   Squamous Epithelial / HPF 0-5 0 - 5 /HPF    Comment: Performed at [facility], [address] [place], [place] [address]  Lactic acid, plasma     Status: None   Collection Time: 05/20/23  9:28 PM  Result Value Ref Range   Lactic Acid, Venous 1.6 0.5 - 1.9 mmol/L    Comment: Performed at [facility], [address] [place], [place] [address]  HIV Antibody (routine testing w rflx)     Status: None   Collection Time: 05/21/23  5:13 AM  Result Value  Ref Range   HIV Screen 4th Generation wRfx Non Reactive Non Reactive    Comment: Performed at [facility], [address], [place], [place] [address]  Hemoglobin A1c     Status: None   Collection Time: 05/21/23  5:13 AM  Result Value Ref Range   Hgb A1c MFr Bld 5.0 4.8 - 5.6 %    Comment: (NOTE) Pre diabetes:          5.7%-6.4%  Diabetes:              >6.4%  Glycemic control for   <7.0% adults with diabetes    Mean Plasma Glucose 96.8 mg/dL    Comment: Performed at [facility], [address] [place], [place] [address]  Glucose, capillary     Status: Abnormal   Collection Time: 05/21/23  6:34 AM  Result Value Ref Range   Glucose-Capillary 100 (H) 70 - 99 mg/dL    Comment: Glucose reference range applies only to samples taken after fasting for at least 8 hours.  CBC     Status: None   Collection Time: 05/21/23  6:42 AM  Result Value Ref Range   WBC 9.2 4.0 - 10.5 K/uL   RBC 5.18 4.22 - 5.81 MIL/uL   Hemoglobin 15.5 13.0 - 17.0 g/dL   HCT 13.0 86.5 - 78.4 %   MCV 90.9 80.0 - 100.0 fL   MCH 29.9 26.0 - 34.0 pg   MCHC 32.9 30.0 - 36.0 g/dL   RDW 69.6 29.5 - 28.4 %   Platelets 247 150 - 400 K/uL   nRBC 0.0 0.0 - 0.2 %    Comment: Performed at [facility], [address] [place], [place] [address]  Comprehensive metabolic panel     Status: Abnormal   Collection Time: 05/21/23  6:42 AM  Result Value Ref Range   Sodium 132 (L) 135 - 145 mmol/L   Potassium 4.3 3.5 - 5.1 mmol/L   Chloride 97 (L) 98 - 111 mmol/L   CO2 26 22 - 32 mmol/L   Glucose, Bld 105 (H) 70 - 99 mg/dL    Comment: Glucose reference range applies only to samples taken after fasting for at least 8 hours.   BUN 10 8 - 23 mg/dL   Creatinine, Ser 0.10 0.61 - 1.24 mg/dL   Calcium 8.8 (L) 8.9 - 10.3 mg/dL   Total Protein 7.4 6.5 - 8.1 g/dL   Albumin 3.7 3.5 - 5.0 g/dL   AST 272 (H) 15 - 41 U/L   ALT 224 (H) 0 -  44 U/L   Alkaline Phosphatase 238 (H) 38 - 126 U/L   Total  Bilirubin 8.0 (H) 0.3 - 1.2 mg/dL    Comment: DELTA CHECK NOTED   GFR, Estimated >60 >60 mL/min    Comment: (NOTE) Calculated using the CKD-EPI Creatinine Equation (2021)    Anion gap 9 5 - 15    Comment: Performed at [facility], [address], [place], [place] [address]  Magnesium     Status: None   Collection Time: 05/21/23  6:42 AM  Result Value Ref Range   Magnesium 2.2 1.7 - 2.4 mg/dL    Comment: Performed at [facility], [address] [place], [place] [address]  Phosphorus     Status: None   Collection Time: 05/21/23  6:42 AM  Result Value Ref Range   Phosphorus 3.8 2.5 - 4.6 mg/dL    Comment: ICTERUS AT THIS LEVEL MAY AFFECT RESULT Performed at [facility], [address] [place], [place] [address]   Heparin level (unfractionated)     Status: Abnormal   Collection Time: 05/21/23  6:42 AM  Result Value Ref Range   Heparin Unfractionated 0.13 (L) 0.30 - 0.70 IU/mL    Comment: (NOTE) The clinical reportable range upper limit is being lowered to >1.10 to align with the FDA approved guidance for the current laboratory assay.  If heparin results are below expected values, and patient dosage has  been confirmed, suggest follow up testing of antithrombin III levels. Performed at [facility], [address], [place], [place] [address]   APTT     Status: None   Collection Time: 05/21/23  6:42 AM  Result Value Ref Range   aPTT 36 24 - 36 seconds    Comment: Performed at [facility], [address], [place], [place] [address]  Glucose, capillary     Status: Abnormal   Collection Time: 05/21/23 12:23 PM  Result Value Ref Range   Glucose-Capillary 124 (H) 70 - 99 mg/dL    Comment: Glucose reference range applies only to samples taken after fasting for at least 8 hours.  Heparin level (unfractionated)     Status: None   Collection Time: 05/21/23 12:42 PM  Result Value Ref  Range   Heparin Unfractionated 0.41 0.30 - 0.70 IU/mL    Comment: (NOTE) The clinical reportable range upper limit is being lowered to >1.10 to align with the FDA approved guidance for the current laboratory assay.  If heparin results are below expected values, and patient dosage has  been confirmed, suggest follow up testing of antithrombin III levels. Performed at [facility], [address] [place], [place] [address]   APTT     Status: Abnormal   Collection Time: 05/21/23 12:42 PM  Result Value Ref Range   aPTT 71 (H) 24 - 36 seconds    Comment:        IF BASELINE aPTT IS ELEVATED, SUGGEST PATIENT RISK ASSESSMENT BE USED TO DETERMINE APPROPRIATE ANTICOAGULANT THERAPY. Performed at [facility], [address], [place], [place] [address]     MR ABDOMEN WITH MRCP W CONTRAST  Result Date: 05/21/2023 CLINICAL DATA:  Right upper quadrant abdominal pain. Abnormal CT and ultrasound examinations. EXAM: MRI ABDOMEN WITHOUT AND WITH CONTRAST (INCLUDING MRCP) TECHNIQUE: Multiplanar multisequence MR imaging of the abdomen was performed both before and after the administration of intravenous contrast. Heavily T2-weighted images of the biliary and pancreatic ducts were obtained,  and three-dimensional MRCP images were rendered by post processing. CONTRAST:  10mL GADAVIST GADOBUTROL 1 MMOL/ML IV SOLN COMPARISON:  CT scan 05/20/2023 and ultrasound 05/21/2023. FINDINGS: Lower chest: The lung bases are clear of an acute process. No pulmonary lesions or pleural effusions. No pericardial effusion. Hepatobiliary: No hepatic lesions or intrahepatic biliary dilatation. The gallbladder is distended, demonstrates numerous small dependently layering gallstones, has marked wall thickening and enhancement and pericholecystic fluid consistent with acute cholecystitis. Normal caliber and course of the common bile duct. Small nonobstructing distal common bile duct stones. Cystic  duct stones are also suspected. Pancreas: No mass, inflammation or ductal dilatation. Small amount of fluid surrounding the pancreas and in the right anterior pararenal space likely related to the gallbladder pathology. No findings suspicious for acute pancreatitis but recommend correlation with lipase and amylase levels. Spleen:  Normal size.  No focal lesions. Adrenals/Urinary Tract: Normal adrenal glands. Both kidneys are normal. Stomach/Bowel: The stomach, duodenum, small bowel and colon are unremarkable. Vascular/Lymphatic: The aorta and branch vessels are patent. The major venous structures are patent. No mesenteric or retroperitoneal mass or adenopathy. Other: Right upper quadrant fluid and inflammation without overt ascites. Musculoskeletal: No significant bony findings. IMPRESSION: 1. MR findings consistent with acute cholecystitis. 2. Small nonobstructing distal common bile duct stones. Cystic duct stones are also suspected. 3. Small amount of fluid surrounding the pancreas and in the right anterior pararenal space likely related to the gallbladder pathology. No findings suspicious for acute pancreatitis but recommend correlation with lipase and amylase levels. Electronically Signed   By: Rudie Meyer M.D.   On: 05/21/2023 10:04   MR 3D Recon At Scanner  Result Date: 05/21/2023 CLINICAL DATA:  Right upper quadrant abdominal pain. Abnormal CT and ultrasound examinations. EXAM: MRI ABDOMEN WITHOUT AND WITH CONTRAST (INCLUDING MRCP) TECHNIQUE: Multiplanar multisequence MR imaging of the abdomen was performed both before and after the administration of intravenous contrast. Heavily T2-weighted images of the biliary and pancreatic ducts were obtained, and three-dimensional MRCP images were rendered by post processing. CONTRAST:  10mL GADAVIST GADOBUTROL 1 MMOL/ML IV SOLN COMPARISON:  CT scan 05/20/2023 and ultrasound 05/21/2023. FINDINGS: Lower chest: The lung bases are clear of an acute process. No  pulmonary lesions or pleural effusions. No pericardial effusion. Hepatobiliary: No hepatic lesions or intrahepatic biliary dilatation. The gallbladder is distended, demonstrates numerous small dependently layering gallstones, has marked wall thickening and enhancement and pericholecystic fluid consistent with acute cholecystitis. Normal caliber and course of the common bile duct. Small nonobstructing distal common bile duct stones. Cystic duct stones are also suspected. Pancreas: No mass, inflammation or ductal dilatation. Small amount of fluid surrounding the pancreas and in the right anterior pararenal space likely related to the gallbladder pathology. No findings suspicious for acute pancreatitis but recommend correlation with lipase and amylase levels. Spleen:  Normal size.  No focal lesions. Adrenals/Urinary Tract: Normal adrenal glands. Both kidneys are normal. Stomach/Bowel: The stomach, duodenum, small bowel and colon are unremarkable. Vascular/Lymphatic: The aorta and branch vessels are patent. The major venous structures are patent. No mesenteric or retroperitoneal mass or adenopathy. Other: Right upper quadrant fluid and inflammation without overt ascites. Musculoskeletal: No significant bony findings. IMPRESSION: 1. MR findings consistent with acute cholecystitis. 2. Small nonobstructing distal common bile duct stones. Cystic duct stones are also suspected. 3. Small amount of fluid surrounding the pancreas and in the right anterior pararenal space likely related to the gallbladder pathology. No findings suspicious for acute pancreatitis but recommend correlation with lipase and amylase  levels. Electronically Signed   By: Rudie Meyer M.D.   On: 05/21/2023 10:04   US Abdomen Limited RUQ (LIVER/GB)  Result Date: 05/21/2023 CLINICAL DATA:  67 year old male with choledocholithiasis on CT, abnormal gallbladder. EXAM: ULTRASOUND ABDOMEN LIMITED RIGHT UPPER QUADRANT COMPARISON:  CT Abdomen and Pelvis  yesterday. FINDINGS: Gallbladder: Abnormal gallbladder wall thickening up to 7 mm. Similar mildly distended appearance of the gallbladder. Echogenic sludge and some shadowing stones within the gallbladder lumen. Stones measure up to 10 mm diameter. No sonographic Murphy sign elicited. Common bile duct: Diameter: 9-10 mm, abnormally dilated. No discrete stone identified within the duct on these images. But questionable sludge within the duct (image 37). Liver: Mild intrahepatic biliary ductal dilatation appears stable. No discrete liver lesion. Background echogenicity within normal limits. Portal vein is patent on color Doppler imaging with normal direction of blood flow towards the liver. Other: Trace perihepatic free fluid (image 61). Negative visible right kidney. IMPRESSION: 1. Ongoing dilated CBD, gallbladder distension and abnormal wall thickening, and gallstones and sludge. Choledocholithiasis less well visualized by ultrasound when compared to the CT yesterday. But appearance otherwise not significantly changed. 2. Trace perihepatic free fluid now. Electronically Signed   By: Odessa Fleming M.D.   On: 05/21/2023 08:03   CT ABDOMEN PELVIS W CONTRAST  Result Date: 05/20/2023 CLINICAL DATA:  Abdominal pain, acute, nonlocalized EXAM: CT ABDOMEN AND PELVIS WITH CONTRAST TECHNIQUE: Multidetector CT imaging of the abdomen and pelvis was performed using the standard protocol following bolus administration of intravenous contrast. RADIATION DOSE REDUCTION: This exam was performed according to the departmental dose-optimization program which includes automated exposure control, adjustment of the mA and/or kV according to patient size and/or use of iterative reconstruction technique. CONTRAST:  OMNIPAQUE IOHEXOL 300 MG/ML  SOLN COMPARISON:  None Available. FINDINGS: Lower chest: No acute abnormality Hepatobiliary: Gallbladder is distended. Layering small stones within the gallbladder. There is gallbladder wall  thickening and pericholecystic fluid. There is concern for possible small stones within the common bile duct. Mild intrahepatic biliary ductal dilatation. No focal hepatic abnormality. Pancreas: No focal abnormality or ductal dilatation. Spleen: No focal abnormality.  Normal size. Adrenals/Urinary Tract: No adrenal abnormality. No focal renal abnormality. No stones or hydronephrosis. Urinary bladder is unremarkable. Stomach/Bowel: Stomach, Sigmoid diverticulosis. No active diverticulitis. Stomach and small bowel decompressed, unremarkable. Vascular/Lymphatic: Aortic atherosclerosis. No evidence of aneurysm or adenopathy. Reproductive: Prostate enlargement. Other: No free fluid or free air. Musculoskeletal: No acute bony abnormality. IMPRESSION: Gallbladder distension with gallbladder wall thickening, pericholecystic fluid and layering stones. Findings concerning for acute cholecystitis. There appears to be high density material within the common bile duct concerning for ductal stones. This could be further evaluated with right upper quadrant ultrasound if felt clinically indicated. Aortic atherosclerosis. Prostate enlargement. Electronically Signed   By: Charlett Nose M.D.   On: 05/20/2023 19:53    Review of Systems  Constitutional: Negative.   HENT: Negative.    Eyes: Negative.   Respiratory: Negative.    Cardiovascular: Negative.   Gastrointestinal:  Positive for abdominal pain and nausea. Negative for abdominal distention, anal bleeding, blood in stool, constipation, diarrhea and rectal pain.  Endocrine: Negative.   Genitourinary: Negative.   Musculoskeletal:  Positive for arthralgias. Negative for joint swelling.  Allergic/Immunologic: Negative.   Neurological: Negative.   Hematological: Negative.   Psychiatric/Behavioral: Negative.     Blood pressure (!) 157/102, pulse 85, temperature 98.1 F (36.7 C), temperature source Oral, resp. rate (!) 22, height 5\' 8"  (1.727 m), weight 93.2  kg, SpO2 99  %. Physical Exam Constitutional:      General: He is not in acute distress.    Appearance: He is well-developed.  HENT:     Head: Normocephalic and atraumatic.     Mouth/Throat:     Mouth: Mucous membranes are moist.  Eyes:     General: Scleral icterus present.     Extraocular Movements: Extraocular movements intact.     Pupils: Pupils are equal, round, and reactive to light.  Cardiovascular:     Rate and Rhythm: Normal rate and regular rhythm.  Abdominal:     General: There is no distension.     Palpations: Abdomen is soft.     Tenderness: There is no abdominal tenderness.  Skin:    General: Skin is warm and dry.  Neurological:     General: No focal deficit present.     Mental Status: He is alert and oriented to person, place, and time.    Assessment/Plan: 1) Abnormal CT scan/abnormal MRCP with choledocholithiasis and possible cystic duct stones with acute cholecystitis-ERCP planned tomorrow by Dr. Jeani Hawking.  We will hold the heparin 4 hours prior to the procedure.  2) GERD. 3) Atrial fibrillation on Eliquis. 4) Hypothyroidism. 5) Anxiety disorder. 6_Obstructive sleep apnea. 7) Overactive bladder.  Charna Elizabeth 05/21/2023, 3:15 PM

## 2023-05-21 NOTE — H&P (Addendum)
History and Physical  Roberto Sanford ZOX:[id number] DOB: [birth year] DOA: 05/20/2023  Referring physician: Accepted by Dr. Yolande Sanford, hospitalist service PCP: Roberto Brunette, MD  Outpatient Specialists: Cardiology. Patient coming from: Home.  Chief Complaint: Abdominal pain and nausea.  HPI: Roberto Sanford is a 67 y.o. male with medical history significant for permanent atrial fibrillation on Eliquis, hypothyroidism, GERD, chronic anxiety/depression, OSA on CPAP, who initially presented to [facility] ED with complaints of severe generalized abdominal pain that started yesterday.  Associated with nausea without vomiting.  No reported subjective fevers.  Admits to chills and feeling hot and cold.  In the ED, hypertensive and tachycardic.  CT abdomen and pelvis with contrast revealed findings suggestive of acute cholecystitis and possible choledocholithiasis.  The patient was started on IV Zosyn every 8 hours.  Also received 1.5 L IV fluid bolus NS x 1 and IV opioid-based analgesics.  [facility], hospitalist service was asked to admit.  Accepted by Dr. Emelda Sanford and transferred to [facility] telemetry unit as inpatient status.  At the time of this visit, the patient was resting comfortably after receiving a dose of pain medication.  ED Course: Temperature 98.2.  BP 157/114, pulse 128, respiratory 17, saturation 98% on CPAP.  Lab studies notable for WBC 10.2, hemoglobin 15.2, platelet 262.  Serum sodium 129, serum glucose 150, alkaline phosphatase 202, AST 313, ALT 221, T. bili 3.4.  Lactic acid 1.6 from 2.1.  Review of Systems: Review of systems as noted in the HPI. All other systems reviewed and are negative.   Past Medical History:  Diagnosis Date   Anxiety    Arrhythmia    Arthritis    Atrial fibrillation (HCC)    GERD (gastroesophageal reflux disease)    Hypothyroidism    Overactive bladder    takes vesicare and elavil   Sleep apnea    uses CPAP nightly   Past Surgical History:   Procedure Laterality Date   CARPAL TUNNEL RELEASE Right 04/27/2015   Procedure: CARPAL TUNNEL RELEASE RIGHT ;  Surgeon: Cindee Salt, MD;  Location: [facility];  Service: Orthopedics;  Laterality: Right;   CARPAL TUNNEL RELEASE Left 05/18/2015   Procedure: LEFT CARPAL TUNNEL RELEASE;  Surgeon: Cindee Salt, MD;  Location: [facility];  Service: Orthopedics;  Laterality: Left;   HERNIA REPAIR     KNEE ARTHROSCOPY Left    TRIGGER FINGER RELEASE Right 04/27/2015   Procedure: RELEASE A-1 PULLEY PULLEY RIGHT MIDDLE FINGER, RELEASE A-1 PULLEY RIGHT RING FINGER ;  Surgeon: Cindee Salt, MD;  Location:  SURGERY CENTER;  Service: Orthopedics;  Laterality: Right;    Social History:  reports that he has never smoked. He has never used smokeless tobacco. He reports that he does not drink alcohol and does not use drugs.   No Known Allergies  Family History  Problem Relation Age of Onset   Diabetes Mother    Cancer Father       Prior to Admission medications   Medication Sig Start Date End Date Taking? Authorizing Provider  amitriptyline (ELAVIL) 25 MG tablet Take 25 mg by mouth at bedtime.    [provider]  apixaban (ELIQUIS) 5 MG TABS tablet Take 1 tablet (5 mg total) by mouth 2 (two) times daily. OVERDUE for follow-up, MUST see PROVIDER for FUTURE refills. 05/07/23   Sande Rives, MD  Cholecalciferol 25 MCG (1000 UT) capsule Take by mouth.    [provider]  furosemide (LASIX) 20 MG  tablet Take 1 tablet (20 mg total) by mouth as needed. 05/20/22 08/18/22  O'NealRonnald Ramp, MD  levothyroxine (SYNTHROID, LEVOTHROID) 150 MCG tablet Take 150 mcg by mouth daily before breakfast.    [provider]  losartan-hydrochlorothiazide (HYZAAR) 100-12.5 MG tablet Take 1 tablet by mouth daily. 01/06/22   [provider]  meloxicam (MOBIC) 15 MG tablet Take 1 tablet by mouth daily. 05/07/21   [provider]   methylPREDNISolone (MEDROL DOSEPAK) 4 MG TBPK tablet Take 6 tablets on day 1.  Take 5 tablets on day 2.  Take 4 tablets on day 3.  Take 3 tablets on day 4.  Take 2 tablets on day 5.  Take 1 tablet on day 6. 08/28/22   Richardean Sale, DO  Multiple Vitamin (MULTIVITAMIN WITH MINERALS) TABS tablet Take 1 tablet by mouth daily.    [provider]  rosuvastatin (CRESTOR) 10 MG tablet Take 10 mg by mouth daily. 05/29/21   [provider]  TOVIAZ 8 MG TB24 tablet Take 8 mg by mouth daily. 01/01/21   [provider]  zolpidem (AMBIEN) 10 MG tablet Take 10 mg by mouth as needed. 07/04/21   [provider]    Physical Exam: BP (!) 155/123 (BP Location: Left Arm)   Pulse 84   Temp 98 F (36.7 C) (Oral)   Resp (!) 21   Ht 5\' 8"  (1.727 m)   Wt 95.3 kg   SpO2 95%   BMI 31.93 kg/m   General: 67 y.o. year-old male well developed well nourished in no acute distress.  Alert and oriented x3. Cardiovascular: Regular rate and rhythm with no rubs or gallops.  No thyromegaly or JVD noted.  No lower extremity edema. 2/4 pulses in all 4 extremities. Respiratory: Clear to auscultation with no wheezes or rales. Good inspiratory effort. Abdomen: Soft right upper quadrant tenderness with palpation.  Obese with normal bowel sounds x4 quadrants. Muskuloskeletal: No cyanosis, clubbing or edema noted bilaterally Neuro: CN II-XII intact, strength, sensation, reflexes Skin: No ulcerative lesions noted or rashes Psychiatry: Judgement and insight appear normal. Mood is appropriate for condition and setting          Labs on Admission:  Basic Metabolic Panel: Recent Labs  Lab 05/20/23 1759  NA 129*  K 4.2  CL 95*  CO2 23  GLUCOSE 150*  BUN 11  CREATININE 0.80  CALCIUM 8.9   Liver Function Tests: Recent Labs  Lab 05/20/23 1759  AST 313*  ALT 221*  ALKPHOS 202*  BILITOT 3.4*  PROT 8.3*  ALBUMIN 4.2   Recent Labs  Lab 05/20/23 1759  LIPASE 32   No results for  input(s): "AMMONIA" in the last 168 hours. CBC: Recent Labs  Lab 05/20/23 1759  WBC 10.2  NEUTROABS 8.9*  HGB 15.2  HCT 45.1  MCV 90.2  PLT 262   Cardiac Enzymes: No results for input(s): "CKTOTAL", "CKMB", "CKMBINDEX", "TROPONINI" in the last 168 hours.  BNP (last 3 results) No results for input(s): "BNP" in the last 8760 hours.  ProBNP (last 3 results) No results for input(s): "PROBNP" in the last 8760 hours.  CBG: No results for input(s): "GLUCAP" in the last 168 hours.  Radiological Exams on Admission: CT ABDOMEN PELVIS W CONTRAST  Result Date: 05/20/2023 CLINICAL DATA:  Abdominal pain, acute, nonlocalized EXAM: CT ABDOMEN AND PELVIS WITH CONTRAST TECHNIQUE: Multidetector CT imaging of the abdomen and pelvis was performed using the standard protocol following bolus administration of intravenous contrast. RADIATION DOSE  REDUCTION: This exam was performed according to the departmental dose-optimization program which includes automated exposure control, adjustment of the mA and/or kV according to patient size and/or use of iterative reconstruction technique. CONTRAST:  OMNIPAQUE IOHEXOL 300 MG/ML  SOLN COMPARISON:  None Available. FINDINGS: Lower chest: No acute abnormality Hepatobiliary: Gallbladder is distended. Layering small stones within the gallbladder. There is gallbladder wall thickening and pericholecystic fluid. There is concern for possible small stones within the common bile duct. Mild intrahepatic biliary ductal dilatation. No focal hepatic abnormality. Pancreas: No focal abnormality or ductal dilatation. Spleen: No focal abnormality.  Normal size. Adrenals/Urinary Tract: No adrenal abnormality. No focal renal abnormality. No stones or hydronephrosis. Urinary bladder is unremarkable. Stomach/Bowel: Stomach, Sigmoid diverticulosis. No active diverticulitis. Stomach and small bowel decompressed, unremarkable. Vascular/Lymphatic: Aortic atherosclerosis. No evidence of  aneurysm or adenopathy. Reproductive: Prostate enlargement. Other: No free fluid or free air. Musculoskeletal: No acute bony abnormality. IMPRESSION: Gallbladder distension with gallbladder wall thickening, pericholecystic fluid and layering stones. Findings concerning for acute cholecystitis. There appears to be high density material within the common bile duct concerning for ductal stones. This could be further evaluated with right upper quadrant ultrasound if felt clinically indicated. Aortic atherosclerosis. Prostate enlargement. Electronically Signed   By: Charlett Nose M.D.   On: 05/20/2023 19:53    EKG: I independently viewed the EKG done and my findings are as followed:    Assessment/Plan Present on Admission:  Acute calculous cholecystitis  Principal Problem:   Acute calculous cholecystitis  Acute calculus cholecystitis, POA Also concern for possible choledocholithiasis Right upper quadrant abdominal ultrasound is pending. Started on IV Zosyn in the ED, continue Continue IV fluid hydration Continue pain control with as needed bowel regimen General surgery consulted by EDP. Monitor fever curve and WBCs  Elevated liver chemistries and elevated T bilirubin with concern for possible choledocholithiasis Alkaline phosphatase, AST ALT elevated, T bilirubin 3.4, trend. Follow right upper quadrant abdominal ultrasound Continue to monitor LFTs  Hypovolemic hyponatremia Serum sodium 129 Start normal saline at 50 cc/h x 2 days Repeat chemistry panel in the morning.  Permanent A-fib on Eliquis Continue to hold off home Eliquis until seen by general surgery Resume home Eliquis if no plan for procedure Start heparin drip for CVA prevention  Hyperglycemia with no prior history of diabetes Obtain hemoglobin A1c Start insulin sliding scale for hyperglycemia every 4 hours while NPO. N.p.o. except for meds and sips until seen by general surgery.  Chronic anxiety/depression  History of  insomnia Resume home amitriptyline  Hypothyroidism Resume home levothyroxine  Hyperlipidemia Resume home Crestor  Essential hypertension BP is not at goal elevated Continue to monitor vital signs.   DVT prophylaxis: SCDs  Code Status: Full code.  Family Communication: None at bedside.  Disposition Plan: Admitted to telemetry unit  Consults called: General surgery.  Admission status: Inpatient status.   Status is: Inpatient The patient requires at least 2 midnights for further evaluation and treatment of present condition.   Darlin Drop MD [facility] Pager [phone]  If 7PM-7AM, please contact night-coverage [email or web address] Password [email or web address]  05/21/2023, 4:35 AM

## 2023-05-21 NOTE — Consult Note (Signed)
Consult Note  Roberto Sanford 1956-09-25  [id number].    Requesting MD: Dr. Dow Adolph Chief Complaint/Reason for Consult: abdominal pain, cholecystitis  HPI:  67 y.o. male with medical history significant for anxiety, arthritis, atrial fibrillation on Eliquis, GERD, hypothyroidism, overactive bladder, OSA on CPAP who presented to [facility] ED 6/11 with abdominal pain. Pain is generalized and began the day prior to ED presentation (6/10). He has had associated nausea without emesis. Bowel movements have been normal.  He denies fever but has had some chills.  He was seen at [facility] with recommendation to proceed to ED - given promethazine which did not provide relief. He has experienced episodes like this before, but much milder. He denies hematochezia, melena, respiratory or urinary complaints.  Workup in ED significant for CT scan showing gallbladder distention, wall thickening and pericholecystic fluid with cholelithiasis concerning for cholecystitis, possible common bile duct stones.  Patient was transferred to [facility] and admitted to the hospitalist service and general surgery asked to see in regards to gallbladder findings.  Substance use: Denies alcohol and tobacco use Allergies: NKDA Blood thinners: Eliquis, last dose 6/10 AM Past Surgeries: supraumbilica Hernia repair   ROS: ROS reviewed and as above.  Family History  Problem Relation Age of Onset   Diabetes Mother    Cancer Father     Past Medical History:  Diagnosis Date   Anxiety    Arrhythmia    Arthritis    Atrial fibrillation (HCC)    GERD (gastroesophageal reflux disease)    Hypothyroidism    Overactive bladder    takes vesicare and elavil   Sleep apnea    uses CPAP nightly    Past Surgical History:  Procedure Laterality Date   CARPAL TUNNEL RELEASE Right 04/27/2015   Procedure: CARPAL TUNNEL RELEASE RIGHT ;  Surgeon: Cindee Salt, MD;  Location: [facility];  Service:  Orthopedics;  Laterality: Right;   CARPAL TUNNEL RELEASE Left 05/18/2015   Procedure: LEFT CARPAL TUNNEL RELEASE;  Surgeon: Cindee Salt, MD;  Location: [facility];  Service: Orthopedics;  Laterality: Left;   HERNIA REPAIR     KNEE ARTHROSCOPY Left    TRIGGER FINGER RELEASE Right 04/27/2015   Procedure: RELEASE A-1 PULLEY PULLEY RIGHT MIDDLE FINGER, RELEASE A-1 PULLEY RIGHT RING FINGER ;  Surgeon: Cindee Salt, MD;  Location: [facility];  Service: Orthopedics;  Laterality: Right;    Social History:  reports that he has never smoked. He has never used smokeless tobacco. He reports that he does not drink alcohol and does not use drugs.  Allergies: No Known Allergies  Medications Prior to Admission  Medication Sig Dispense Refill   amitriptyline (ELAVIL) 25 MG tablet Take 25 mg by mouth at bedtime.     apixaban (ELIQUIS) 5 MG TABS tablet Take 1 tablet (5 mg total) by mouth 2 (two) times daily. OVERDUE for follow-up, MUST see PROVIDER for FUTURE refills. 60 tablet 2   Cholecalciferol 25 MCG (1000 UT) capsule Take by mouth.     furosemide (LASIX) 20 MG tablet Take 1 tablet (20 mg total) by mouth as needed. 90 tablet 3   levothyroxine (SYNTHROID, LEVOTHROID) 150 MCG tablet Take 150 mcg by mouth daily before breakfast.     losartan-hydrochlorothiazide (HYZAAR) 100-12.5 MG tablet Take 1 tablet by mouth daily.     meloxicam (MOBIC) 15 MG tablet Take 1 tablet by mouth daily.     methylPREDNISolone (MEDROL DOSEPAK)  4 MG TBPK tablet Take 6 tablets on day 1.  Take 5 tablets on day 2.  Take 4 tablets on day 3.  Take 3 tablets on day 4.  Take 2 tablets on day 5.  Take 1 tablet on day 6. 21 tablet 0   Multiple Vitamin (MULTIVITAMIN WITH MINERALS) TABS tablet Take 1 tablet by mouth daily.     rosuvastatin (CRESTOR) 10 MG tablet Take 10 mg by mouth daily.     TOVIAZ 8 MG TB24 tablet Take 8 mg by mouth daily.     zolpidem (AMBIEN) 10 MG tablet Take 10 mg by mouth as needed.       Blood pressure (!) 157/114, pulse 76, temperature 98.2 F (36.8 C), temperature source Oral, resp. rate 17, height 5\' 8"  (1.727 m), weight 93.2 kg, SpO2 98 %. Physical Exam: General: pleasant, WD, male who is laying in bed in NAD HEENT: head is normocephalic, atraumatic.  Sclera are icteric.  Pupils equal and round. EOMs intact.  Ears and nose without any masses or lesions.  Mouth is pink and dry Heart: irregular.  Normal s1,s2. No obvious murmurs, gallops, or rubs noted.  Palpable radial and pedal pulses bilaterally Lungs: CTAB, no wheezes, rhonchi, or rales noted.  Respiratory effort nonlabored Abd: soft, tender in RUQ and in epigastrium, ND, +BS, no masses, hernias, or organomegaly.  Scar from prior supraumbilical hernia repair MSK: all 4 extremities are symmetrical with no cyanosis, clubbing, or edema. Skin: warm and dry with no masses, lesions, or rashes.  Jaundiced Psych: A&Ox3 with an appropriate affect.    Results for orders placed or performed during the hospital encounter of 05/20/23 (from the past 48 hour(s))  Lactic acid, plasma     Status: Abnormal   Collection Time: 05/20/23  5:45 PM  Result Value Ref Range   Lactic Acid, Venous 2.1 (HH) 0.5 - 1.9 mmol/L    Comment: CRITICAL RESULT CALLED TO, READ BACK BY AND VERIFIED WITH ALEXANDRA NOAH RN AT [phone] ON 05/20/23 BY [email or web address] Performed at [facility], [address] [place], [place] [address]   Comprehensive metabolic panel     Status: Abnormal   Collection Time: 05/20/23  5:59 PM  Result Value Ref Range   Sodium 129 (L) 135 - 145 mmol/L   Potassium 4.2 3.5 - 5.1 mmol/L   Chloride 95 (L) 98 - 111 mmol/L   CO2 23 22 - 32 mmol/L   Glucose, Bld 150 (H) 70 - 99 mg/dL    Comment: Glucose reference range applies only to samples taken after fasting for at least 8 hours.   BUN 11 8 - 23 mg/dL   Creatinine, Ser 4.09 0.61 - 1.24 mg/dL   Calcium 8.9 8.9 - 81.1 mg/dL   Total Protein 8.3 (H) 6.5 - 8.1 g/dL   Albumin  4.2 3.5 - 5.0 g/dL   AST 914 (H) 15 - 41 U/L   ALT 221 (H) 0 - 44 U/L   Alkaline Phosphatase 202 (H) 38 - 126 U/L   Total Bilirubin 3.4 (H) 0.3 - 1.2 mg/dL   GFR, Estimated >78 >29 mL/min    Comment: (NOTE) Calculated using the CKD-EPI Creatinine Equation (2021)    Anion gap 11 5 - 15    Comment: Performed at [facility], [address] [place], [place] [address]  Lipase, blood     Status: None   Collection Time: 05/20/23  5:59 PM  Result Value Ref Range  Lipase 32 11 - 51 U/L    Comment: Performed at [facility], [address] [place], [place] [address]  CBC with Diff     Status: Abnormal   Collection Time: 05/20/23  5:59 PM  Result Value Ref Range   WBC 10.2 4.0 - 10.5 K/uL   RBC 5.00 4.22 - 5.81 MIL/uL   Hemoglobin 15.2 13.0 - 17.0 g/dL   HCT 60.4 54.0 - 98.1 %   MCV 90.2 80.0 - 100.0 fL   MCH 30.4 26.0 - 34.0 pg   MCHC 33.7 30.0 - 36.0 g/dL   RDW 19.1 47.8 - 29.5 %   Platelets 262 150 - 400 K/uL   nRBC 0.0 0.0 - 0.2 %   Neutrophils Relative % 87 %   Neutro Abs 8.9 (H) 1.7 - 7.7 K/uL   Lymphocytes Relative 5 %   Lymphs Abs 0.5 (L) 0.7 - 4.0 K/uL   Monocytes Relative 7 %   Monocytes Absolute 0.8 0.1 - 1.0 K/uL   Eosinophils Relative 0 %   Eosinophils Absolute 0.0 0.0 - 0.5 K/uL   Basophils Relative 0 %   Basophils Absolute 0.0 0.0 - 0.1 K/uL   Immature Granulocytes 1 %   Abs Immature Granulocytes 0.05 0.00 - 0.07 K/uL    Comment: Performed at [facility], [address] [place], [place] [address]  Urinalysis, Routine w reflex microscopic -Urine, Clean Catch     Status: Abnormal   Collection Time: 05/20/23  7:48 PM  Result Value Ref Range   Color, Urine YELLOW YELLOW   APPearance CLEAR CLEAR   Specific Gravity, Urine 1.020 1.005 - 1.030   pH 7.0 5.0 - 8.0   Glucose, UA NEGATIVE NEGATIVE mg/dL   Hgb urine dipstick TRACE (A) NEGATIVE   Bilirubin Urine NEGATIVE NEGATIVE   Ketones, ur NEGATIVE NEGATIVE mg/dL   Protein, ur  NEGATIVE NEGATIVE mg/dL   Nitrite NEGATIVE NEGATIVE   Leukocytes,Ua NEGATIVE NEGATIVE    Comment: Performed at [facility], [address] [place], [place] [address]  Urinalysis, Microscopic (reflex)     Status: Abnormal   Collection Time: 05/20/23  7:48 PM  Result Value Ref Range   RBC / HPF 0-5 0 - 5 RBC/hpf   WBC, UA 0-5 0 - 5 WBC/hpf   Bacteria, UA RARE (A) NONE SEEN   Squamous Epithelial / HPF 0-5 0 - 5 /HPF    Comment: Performed at [facility], [address] [place], [place] [address]  Lactic acid, plasma     Status: None   Collection Time: 05/20/23  9:28 PM  Result Value Ref Range   Lactic Acid, Venous 1.6 0.5 - 1.9 mmol/L    Comment: Performed at [facility], [address] [place], [place] [address]  Hemoglobin A1c     Status: None   Collection Time: 05/21/23  5:13 AM  Result Value Ref Range   Hgb A1c MFr Bld 5.0 4.8 - 5.6 %    Comment: (NOTE) Pre diabetes:          5.7%-6.4%  Diabetes:              >6.4%  Glycemic control for   <7.0% adults with diabetes    Mean Plasma Glucose 96.8 mg/dL    Comment: Performed at [facility], [address] [place], [place] [address]  Glucose, capillary     Status: Abnormal  Collection Time: 05/21/23  6:34 AM  Result Value Ref Range   Glucose-Capillary 100 (H) 70 - 99 mg/dL    Comment: Glucose reference range applies only to samples taken after fasting for at least 8 hours.  CBC     Status: None   Collection Time: 05/21/23  6:42 AM  Result Value Ref Range   WBC 9.2 4.0 - 10.5 K/uL   RBC 5.18 4.22 - 5.81 MIL/uL   Hemoglobin 15.5 13.0 - 17.0 g/dL   HCT 82.9 56.2 - 13.0 %   MCV 90.9 80.0 - 100.0 fL   MCH 29.9 26.0 - 34.0 pg   MCHC 32.9 30.0 - 36.0 g/dL   RDW 86.5 78.4 - 69.6 %   Platelets 247 150 - 400 K/uL   nRBC 0.0 0.0 - 0.2 %    Comment: Performed at [facility], [address] [place], [place] [address]  Comprehensive metabolic panel     Status: Abnormal    Collection Time: 05/21/23  6:42 AM  Result Value Ref Range   Sodium 132 (L) 135 - 145 mmol/L   Potassium 4.3 3.5 - 5.1 mmol/L   Chloride 97 (L) 98 - 111 mmol/L   CO2 26 22 - 32 mmol/L   Glucose, Bld 105 (H) 70 - 99 mg/dL    Comment: Glucose reference range applies only to samples taken after fasting for at least 8 hours.   BUN 10 8 - 23 mg/dL   Creatinine, Ser 4.13 0.61 - 1.24 mg/dL   Calcium 8.8 (L) 8.9 - 10.3 mg/dL   Total Protein 7.4 6.5 - 8.1 g/dL   Albumin 3.7 3.5 - 5.0 g/dL   AST 244 (H) 15 - 41 U/L   ALT 224 (H) 0 - 44 U/L   Alkaline Phosphatase 238 (H) 38 - 126 U/L   Total Bilirubin 8.0 (H) 0.3 - 1.2 mg/dL    Comment: DELTA CHECK NOTED   GFR, Estimated >60 >60 mL/min    Comment: (NOTE) Calculated using the CKD-EPI Creatinine Equation (2021)    Anion gap 9 5 - 15    Comment: Performed at [facility], [address] [place], [place] [address]  Magnesium     Status: None   Collection Time: 05/21/23  6:42 AM  Result Value Ref Range   Magnesium 2.2 1.7 - 2.4 mg/dL    Comment: Performed at [facility], [address], [place], [place] [address]  Phosphorus     Status: None   Collection Time: 05/21/23  6:42 AM  Result Value Ref Range   Phosphorus 3.8 2.5 - 4.6 mg/dL    Comment: ICTERUS AT THIS LEVEL MAY AFFECT RESULT Performed at [facility], [address] [place], [place] [address]   Heparin level (unfractionated)     Status: Abnormal   Collection Time: 05/21/23  6:42 AM  Result Value Ref Range   Heparin Unfractionated 0.13 (L) 0.30 - 0.70 IU/mL    Comment: (NOTE) The clinical reportable range upper limit is being lowered to >1.10 to align with the FDA approved guidance for the current laboratory assay.  If heparin results are below expected values, and patient dosage has  been confirmed, suggest follow up testing of antithrombin III levels. Performed at [facility], [address] [place], [place] [address]   APTT     Status: None   Collection Time: 05/21/23  6:42 AM  Result Value Ref Range   aPTT  36 24 - 36 seconds    Comment: Performed at [facility], [address] [place], [place] [address]   CT ABDOMEN PELVIS W CONTRAST  Result Date: 05/20/2023 CLINICAL DATA:  Abdominal pain, acute, nonlocalized EXAM: CT ABDOMEN AND PELVIS WITH CONTRAST TECHNIQUE: Multidetector CT imaging of the abdomen and pelvis was performed using the standard protocol following bolus administration of intravenous contrast. RADIATION DOSE REDUCTION: This exam was performed according to the departmental dose-optimization program which includes automated exposure control, adjustment of the mA and/or kV according to patient size and/or use of iterative reconstruction technique. CONTRAST:  OMNIPAQUE IOHEXOL 300 MG/ML  SOLN COMPARISON:  None Available. FINDINGS: Lower chest: No acute abnormality Hepatobiliary: Gallbladder is distended. Layering small stones within the gallbladder. There is gallbladder wall thickening and pericholecystic fluid. There is concern for possible small stones within the common bile duct. Mild intrahepatic biliary ductal dilatation. No focal hepatic abnormality. Pancreas: No focal abnormality or ductal dilatation. Spleen: No focal abnormality.  Normal size. Adrenals/Urinary Tract: No adrenal abnormality. No focal renal abnormality. No stones or hydronephrosis. Urinary bladder is unremarkable. Stomach/Bowel: Stomach, Sigmoid diverticulosis. No active diverticulitis. Stomach and small bowel decompressed, unremarkable. Vascular/Lymphatic: Aortic atherosclerosis. No evidence of aneurysm or adenopathy. Reproductive: Prostate enlargement. Other: No free fluid or free air. Musculoskeletal: No acute bony abnormality. IMPRESSION: Gallbladder distension with gallbladder wall thickening, pericholecystic fluid and layering stones. Findings concerning for acute  cholecystitis. There appears to be high density material within the common bile duct concerning for ductal stones. This could be further evaluated with right upper quadrant ultrasound if felt clinically indicated. Aortic atherosclerosis. Prostate enlargement. Electronically Signed   By: Charlett Nose M.D.   On: 05/20/2023 19:53      Assessment/Plan Acute calculus cholecystitis, Choledocholithiasis Patient seen and evaluated and relevant labs and imaging personally reviewed.  CT scan in ED with findings concerning for acute cholecystitis and choledocholithiasis.  Ultrasound this morning with similar findings for cholecystitis and ongoing dilated CBD.  MRCP reveals distal CBD stones as well.  LFTs are significantly elevated and total bilirubin is up to 8 from 3.4 yesterday.  I recommend GI evaluation, which I have discussed with the primary service.  Continue antibiotics and n.p.o.  Ultimately, patient would benefit from laparoscopic cholecystectomy this admission once his duct is cleared.  Ok for heparin gtt for now.  He has essentially been off his eliquis for 48 hrs at this point.  This was all discussed with the patient including a drawing and explanation for the disease process.  All questions answered.   FEN: N.p.o., IVF per primary ID: [email or web address] VTE: heparin gtt, eliquis on hold  Per primary A-fib on Eliquis (last dose Monday am) OSA GERD Anxiety Arthritis Hypothyroidism  HTN  I reviewed hospitalist notes, last 24 h vitals and pain scores, last 48 h intake and output, last 24 h labs and trends, and last 24 h imaging results.   Letha Cape, [facility] 05/21/2023, 7:57 AM Please see Amion for pager number during day hours 7:00am-4:30pm

## 2023-05-21 NOTE — H&P (View-Only) (Signed)
Reason for Consult: Cholelithiasis. Referring Physician: [facility]  Roberto Sanford is an 66 y.o. male.  HPI: Patient is a 66-year-old white male with multiple medical problems below listed below who presented to the emergency room with acute abdominal pain that started after he had beef stroganoff for dinner that night the pain was intense and caused severe nausea but he did not vomit. He claims he had similar symptoms a couple of times in the past. He denied having any fevers or chills.  He was found to be hypertensive and tachycardic in the ER and a CT scan of the abdomen pelvis revealed acute cholecystitis with possible choledocholithiasis. He was started on IV Zosyn.  MRCP done earlier today showed acute cholecystitis with small nonobstructing distal common bile duct stones and possible cystic duct stones with small amount of fluid surrounding the pancreas in the right anterior pararenal space likely related to gallbladder pathology. There was no evidence of acute pancreatitis. His lipase level is normal.  He is on Eliquis at home for atrial fibrillation and the last dose was 2 days ago.  He is currently on a heparin drip.  His total bili was 3.4 with a AST of 313 with ALT of 221 and alkaline phosphatase of 202.  Past Medical History:  Diagnosis Date   Anxiety    Arrhythmia    Arthritis    Atrial fibrillation (HCC)    GERD (gastroesophageal reflux disease)    Hypothyroidism    Overactive bladder    takes vesicare and elavil   Sleep apnea    uses CPAP nightly   Past Surgical History:  Procedure Laterality Date   CARPAL TUNNEL RELEASE Right 04/27/2015   Procedure: CARPAL TUNNEL RELEASE RIGHT ;  Surgeon: Gary Kuzma, MD;  Location: [facility];  Service: Orthopedics;  Laterality: Right;   CARPAL TUNNEL RELEASE Left 05/18/2015   Procedure: LEFT CARPAL TUNNEL RELEASE;  Surgeon: Gary Kuzma, MD;  Location: [facility];  Service: Orthopedics;  Laterality: Left;    HERNIA REPAIR     KNEE ARTHROSCOPY Left    TRIGGER FINGER RELEASE Right 04/27/2015   Procedure: RELEASE A-1 PULLEY PULLEY RIGHT MIDDLE FINGER, RELEASE A-1 PULLEY RIGHT RING FINGER ;  Surgeon: Gary Kuzma, MD;  Location: [facility];  Service: Orthopedics;  Laterality: Right;   Family History  Problem Relation Age of Onset   Diabetes Mother    Cancer Father    Social History:  reports that he has never smoked. He has never used smokeless tobacco. He reports that he does not drink alcohol and does not use drugs.  He is a retired truck driver.  Allergies: No Known Allergies  Medications: I have reviewed the patient's current medications. Prior to Admission:  Medications Prior to Admission  Medication Sig Dispense Refill Last Dose   amitriptyline (ELAVIL) 25 MG tablet Take 25 mg by mouth at bedtime.   Past Week   Cholecalciferol 25 MCG (1000 UT) capsule Take 1,000 Units by mouth daily.   Past Month   furosemide (LASIX) 20 MG tablet Take 1 tablet (20 mg total) by mouth as needed. 90 tablet 3 Past Week   levothyroxine (SYNTHROID) 100 MCG tablet Take 100 mcg by mouth daily before breakfast.   Past Week   losartan-hydrochlorothiazide (HYZAAR) 100-12.5 MG tablet Take 1 tablet by mouth daily.   Past Week   Multiple Vitamin (MULTIVITAMIN WITH MINERALS) TABS tablet Take 1 tablet by mouth daily.   Past Month     oxybutynin (DITROPAN-XL) 5 MG 24 hr tablet Take 5 mg by mouth at bedtime.   Past Week   rosuvastatin (CRESTOR) 10 MG tablet Take 10 mg by mouth daily.   Past Week   zolpidem (AMBIEN) 10 MG tablet Take 10 mg by mouth as needed for sleep.   Past Week   apixaban (ELIQUIS) 5 MG TABS tablet Take 1 tablet (5 mg total) by mouth 2 (two) times daily. OVERDUE for follow-up, MUST see PROVIDER for FUTURE refills. (Patient taking differently: Take 5 mg by mouth 2 (two) times daily.) 60 tablet 2 05/19/2023 at 0800   meloxicam (MOBIC) 15 MG tablet Take 1 tablet by mouth daily. (Patient not taking:  Reported on 05/21/2023)   Not Taking   methylPREDNISolone (MEDROL DOSEPAK) 4 MG TBPK tablet Take 6 tablets on day 1.  Take 5 tablets on day 2.  Take 4 tablets on day 3.  Take 3 tablets on day 4.  Take 2 tablets on day 5.  Take 1 tablet on day 6. (Patient not taking: Reported on 05/21/2023) 21 tablet 0 Completed Course   Scheduled:  amitriptyline  25 mg Oral QHS   insulin aspart  0-9 Units Subcutaneous Q4H   levothyroxine  100 mcg Oral Q0600   multivitamin with minerals  1 tablet Oral Daily   rosuvastatin  10 mg Oral Daily   Continuous:  sodium chloride 75 mL/hr at 05/21/23 1508   heparin 1,350 Units/hr (05/21/23 0633)   piperacillin-tazobactam 3.375 g (05/21/23 1348)   PRN:HYDROmorphone (DILAUDID) injection, labetalol, melatonin, oxyCODONE, polyethylene glycol, prochlorperazine  Results for orders placed or performed during the hospital encounter of 05/20/23 (from the past 48 hour(s))  Lactic acid, plasma     Status: Abnormal   Collection Time: 05/20/23  5:45 PM  Result Value Ref Range   Lactic Acid, Venous 2.1 (HH) 0.5 - 1.9 mmol/L    Comment: CRITICAL RESULT CALLED TO, READ BACK BY AND VERIFIED WITH ALEXANDRA NOAH RN AT 1837 ON 05/20/23 BY [email or web address] Performed at [facility] [place], [address] [place], [place] [address]   Comprehensive metabolic panel     Status: Abnormal   Collection Time: 05/20/23  5:59 PM  Result Value Ref Range   Sodium 129 (L) 135 - 145 mmol/L   Potassium 4.2 3.5 - 5.1 mmol/L   Chloride 95 (L) 98 - 111 mmol/L   CO2 23 22 - 32 mmol/L   Glucose, Bld 150 (H) 70 - 99 mg/dL    Comment: Glucose reference range applies only to samples taken after fasting for at least 8 hours.   BUN 11 8 - 23 mg/dL   Creatinine, Ser 0.80 0.61 - 1.24 mg/dL   Calcium 8.9 8.9 - 10.3 mg/dL   Total Protein 8.3 (H) 6.5 - 8.1 g/dL   Albumin 4.2 3.5 - 5.0 g/dL   AST 313 (H) 15 - 41 U/L   ALT 221 (H) 0 - 44 U/L   Alkaline Phosphatase 202 (H) 38 - 126 U/L   Total Bilirubin 3.4 (H)  0.3 - 1.2 mg/dL   GFR, Estimated >60 >60 mL/min    Comment: (NOTE) Calculated using the CKD-EPI Creatinine Equation (2021)    Anion gap 11 5 - 15    Comment: Performed at [facility] [place], [address] [place], [place] [address]  Lipase, blood     Status: None   Collection Time: 05/20/23  5:59 PM  Result Value Ref Range   Lipase 32 11 -   51 U/L    Comment: Performed at [facility] [place], [address] [place], [place] [address]  CBC with Diff     Status: Abnormal   Collection Time: 05/20/23  5:59 PM  Result Value Ref Range   WBC 10.2 4.0 - 10.5 K/uL   RBC 5.00 4.22 - 5.81 MIL/uL   Hemoglobin 15.2 13.0 - 17.0 g/dL   HCT 45.1 39.0 - 52.0 %   MCV 90.2 80.0 - 100.0 fL   MCH 30.4 26.0 - 34.0 pg   MCHC 33.7 30.0 - 36.0 g/dL   RDW 13.2 11.5 - 15.5 %   Platelets 262 150 - 400 K/uL   nRBC 0.0 0.0 - 0.2 %   Neutrophils Relative % 87 %   Neutro Abs 8.9 (H) 1.7 - 7.7 K/uL   Lymphocytes Relative 5 %   Lymphs Abs 0.5 (L) 0.7 - 4.0 K/uL   Monocytes Relative 7 %   Monocytes Absolute 0.8 0.1 - 1.0 K/uL   Eosinophils Relative 0 %   Eosinophils Absolute 0.0 0.0 - 0.5 K/uL   Basophils Relative 0 %   Basophils Absolute 0.0 0.0 - 0.1 K/uL   Immature Granulocytes 1 %   Abs Immature Granulocytes 0.05 0.00 - 0.07 K/uL    Comment: Performed at [facility] [place], [address] [place], [place] [address]  Blood culture (routine x 2)     Status: None (Preliminary result)   Collection Time: 05/20/23  7:25 PM   Specimen: BLOOD  Result Value Ref Range   Specimen Description      BLOOD LEFT ANTECUBITAL Performed at [facility] [place], [address] [place], [place] [address]    Special Requests      BOTTLES DRAWN AEROBIC AND ANAEROBIC Blood Culture adequate volume Performed at [facility] [place], [address] [place], [place] [address]    Culture      NO GROWTH < 12 HOURS Performed at [facility], [address] [place], [place] [address]    Report  Status PENDING   Blood culture (routine x 2)     Status: None (Preliminary result)   Collection Time: 05/20/23  7:30 PM   Specimen: BLOOD  Result Value Ref Range   Specimen Description      BLOOD RIGHT ANTECUBITAL Performed at [facility] [place], [address] [place], [place] [address]    Special Requests      BOTTLES DRAWN AEROBIC AND ANAEROBIC Blood Culture adequate volume Performed at [facility] [place], [address] [place], [place] [address]    Culture      NO GROWTH < 12 HOURS Performed at [facility], [address] [place], [place] [address]    Report Status PENDING   Urinalysis, Routine w reflex microscopic -Urine, Clean Catch     Status: Abnormal   Collection Time: 05/20/23  7:48 PM  Result Value Ref Range   Color, Urine YELLOW YELLOW   APPearance CLEAR CLEAR   Specific Gravity, Urine 1.020 1.005 - 1.030   pH 7.0 5.0 - 8.0   Glucose, UA NEGATIVE NEGATIVE mg/dL   Hgb urine dipstick TRACE (A) NEGATIVE   Bilirubin Urine NEGATIVE NEGATIVE   Ketones, ur NEGATIVE NEGATIVE mg/dL   Protein, ur NEGATIVE NEGATIVE mg/dL   Nitrite NEGATIVE NEGATIVE   Leukocytes,Ua NEGATIVE NEGATIVE    Comment: Performed at [facility] [place], [address] [place],   [place] [address]  Urinalysis, Microscopic (reflex)     Status: Abnormal   Collection Time: 05/20/23  7:48 PM  Result Value Ref Range   RBC / HPF 0-5 0 - 5 RBC/hpf   WBC, UA 0-5 0 - 5 WBC/hpf   Bacteria, UA RARE (A) NONE SEEN   Squamous Epithelial / HPF 0-5 0 - 5 /HPF    Comment: Performed at [facility] [place], [address] [place], [place] [address]  Lactic acid, plasma     Status: None   Collection Time: 05/20/23  9:28 PM  Result Value Ref Range   Lactic Acid, Venous 1.6 0.5 - 1.9 mmol/L    Comment: Performed at [facility] [place], [address] [place], [place] [address]  HIV Antibody (routine testing w rflx)     Status: None   Collection Time: 05/21/23  5:13 AM  Result Value  Ref Range   HIV Screen 4th Generation wRfx Non Reactive Non Reactive    Comment: Performed at [facility], [address] [place], [place] [address]  Hemoglobin A1c     Status: None   Collection Time: 05/21/23  5:13 AM  Result Value Ref Range   Hgb A1c MFr Bld 5.0 4.8 - 5.6 %    Comment: (NOTE) Pre diabetes:          5.7%-6.4%  Diabetes:              >6.4%  Glycemic control for   <7.0% adults with diabetes    Mean Plasma Glucose 96.8 mg/dL    Comment: Performed at [facility], [address] [place], [place] [address]  Glucose, capillary     Status: Abnormal   Collection Time: 05/21/23  6:34 AM  Result Value Ref Range   Glucose-Capillary 100 (H) 70 - 99 mg/dL    Comment: Glucose reference range applies only to samples taken after fasting for at least 8 hours.  CBC     Status: None   Collection Time: 05/21/23  6:42 AM  Result Value Ref Range   WBC 9.2 4.0 - 10.5 K/uL   RBC 5.18 4.22 - 5.81 MIL/uL   Hemoglobin 15.5 13.0 - 17.0 g/dL   HCT 47.1 39.0 - 52.0 %   MCV 90.9 80.0 - 100.0 fL   MCH 29.9 26.0 - 34.0 pg   MCHC 32.9 30.0 - 36.0 g/dL   RDW 13.4 11.5 - 15.5 %   Platelets 247 150 - 400 K/uL   nRBC 0.0 0.0 - 0.2 %    Comment: Performed at [facility], [address] [place], [place] [address]  Comprehensive metabolic panel     Status: Abnormal   Collection Time: 05/21/23  6:42 AM  Result Value Ref Range   Sodium 132 (L) 135 - 145 mmol/L   Potassium 4.3 3.5 - 5.1 mmol/L   Chloride 97 (L) 98 - 111 mmol/L   CO2 26 22 - 32 mmol/L   Glucose, Bld 105 (H) 70 - 99 mg/dL    Comment: Glucose reference range applies only to samples taken after fasting for at least 8 hours.   BUN 10 8 - 23 mg/dL   Creatinine, Ser 0.82 0.61 - 1.24 mg/dL   Calcium 8.8 (L) 8.9 - 10.3 mg/dL   Total Protein 7.4 6.5 - 8.1 g/dL   Albumin 3.7 3.5 - 5.0 g/dL   AST 201 (H) 15 - 41 U/L   ALT 224 (H) 0 -   44 U/L   Alkaline Phosphatase 238 (H) 38 - 126 U/L   Total  Bilirubin 8.0 (H) 0.3 - 1.2 mg/dL    Comment: DELTA CHECK NOTED   GFR, Estimated >60 >60 mL/min    Comment: (NOTE) Calculated using the CKD-EPI Creatinine Equation (2021)    Anion gap 9 5 - 15    Comment: Performed at [facility], [address] [place], [place] [address]  Magnesium     Status: None   Collection Time: 05/21/23  6:42 AM  Result Value Ref Range   Magnesium 2.2 1.7 - 2.4 mg/dL    Comment: Performed at [facility], [address] [place], [place] [address]  Phosphorus     Status: None   Collection Time: 05/21/23  6:42 AM  Result Value Ref Range   Phosphorus 3.8 2.5 - 4.6 mg/dL    Comment: ICTERUS AT THIS LEVEL MAY AFFECT RESULT Performed at [facility], [address] [place], [place] [address]   Heparin level (unfractionated)     Status: Abnormal   Collection Time: 05/21/23  6:42 AM  Result Value Ref Range   Heparin Unfractionated 0.13 (L) 0.30 - 0.70 IU/mL    Comment: (NOTE) The clinical reportable range upper limit is being lowered to >1.10 to align with the FDA approved guidance for the current laboratory assay.  If heparin results are below expected values, and patient dosage has  been confirmed, suggest follow up testing of antithrombin III levels. Performed at [facility], [address] [place], [place] [address]   APTT     Status: None   Collection Time: 05/21/23  6:42 AM  Result Value Ref Range   aPTT 36 24 - 36 seconds    Comment: Performed at [facility], [address] [place], [place] [address]  Glucose, capillary     Status: Abnormal   Collection Time: 05/21/23 12:23 PM  Result Value Ref Range   Glucose-Capillary 124 (H) 70 - 99 mg/dL    Comment: Glucose reference range applies only to samples taken after fasting for at least 8 hours.  Heparin level (unfractionated)     Status: None   Collection Time: 05/21/23 12:42 PM  Result Value Ref  Range   Heparin Unfractionated 0.41 0.30 - 0.70 IU/mL    Comment: (NOTE) The clinical reportable range upper limit is being lowered to >1.10 to align with the FDA approved guidance for the current laboratory assay.  If heparin results are below expected values, and patient dosage has  been confirmed, suggest follow up testing of antithrombin III levels. Performed at [facility], [address] [place], [place] [address]   APTT     Status: Abnormal   Collection Time: 05/21/23 12:42 PM  Result Value Ref Range   aPTT 71 (H) 24 - 36 seconds    Comment:        IF BASELINE aPTT IS ELEVATED, SUGGEST PATIENT RISK ASSESSMENT BE USED TO DETERMINE APPROPRIATE ANTICOAGULANT THERAPY. Performed at [facility], [address] [place],  [address]     MR ABDOMEN WITH MRCP W CONTRAST  Result Date: 05/21/2023 CLINICAL DATA:  Right upper quadrant abdominal pain. Abnormal CT and ultrasound examinations. EXAM: MRI ABDOMEN WITHOUT AND WITH CONTRAST (INCLUDING MRCP) TECHNIQUE: Multiplanar multisequence MR imaging of the abdomen was performed both before and after the administration of intravenous contrast. Heavily T2-weighted images of the biliary and pancreatic ducts were obtained,   and three-dimensional MRCP images were rendered by post processing. CONTRAST:  10mL GADAVIST GADOBUTROL 1 MMOL/ML IV SOLN COMPARISON:  CT scan 05/20/2023 and ultrasound 05/21/2023. FINDINGS: Lower chest: The lung bases are clear of an acute process. No pulmonary lesions or pleural effusions. No pericardial effusion. Hepatobiliary: No hepatic lesions or intrahepatic biliary dilatation. The gallbladder is distended, demonstrates numerous small dependently layering gallstones, has marked wall thickening and enhancement and pericholecystic fluid consistent with acute cholecystitis. Normal caliber and course of the common bile duct. Small nonobstructing distal common bile duct stones. Cystic  duct stones are also suspected. Pancreas: No mass, inflammation or ductal dilatation. Small amount of fluid surrounding the pancreas and in the right anterior pararenal space likely related to the gallbladder pathology. No findings suspicious for acute pancreatitis but recommend correlation with lipase and amylase levels. Spleen:  Normal size.  No focal lesions. Adrenals/Urinary Tract: Normal adrenal glands. Both kidneys are normal. Stomach/Bowel: The stomach, duodenum, small bowel and colon are unremarkable. Vascular/Lymphatic: The aorta and branch vessels are patent. The major venous structures are patent. No mesenteric or retroperitoneal mass or adenopathy. Other: Right upper quadrant fluid and inflammation without overt ascites. Musculoskeletal: No significant bony findings. IMPRESSION: 1. MR findings consistent with acute cholecystitis. 2. Small nonobstructing distal common bile duct stones. Cystic duct stones are also suspected. 3. Small amount of fluid surrounding the pancreas and in the right anterior pararenal space likely related to the gallbladder pathology. No findings suspicious for acute pancreatitis but recommend correlation with lipase and amylase levels. Electronically Signed   By: P.  Gallerani M.D.   On: 05/21/2023 10:04   MR 3D Recon At Scanner  Result Date: 05/21/2023 CLINICAL DATA:  Right upper quadrant abdominal pain. Abnormal CT and ultrasound examinations. EXAM: MRI ABDOMEN WITHOUT AND WITH CONTRAST (INCLUDING MRCP) TECHNIQUE: Multiplanar multisequence MR imaging of the abdomen was performed both before and after the administration of intravenous contrast. Heavily T2-weighted images of the biliary and pancreatic ducts were obtained, and three-dimensional MRCP images were rendered by post processing. CONTRAST:  10mL GADAVIST GADOBUTROL 1 MMOL/ML IV SOLN COMPARISON:  CT scan 05/20/2023 and ultrasound 05/21/2023. FINDINGS: Lower chest: The lung bases are clear of an acute process. No  pulmonary lesions or pleural effusions. No pericardial effusion. Hepatobiliary: No hepatic lesions or intrahepatic biliary dilatation. The gallbladder is distended, demonstrates numerous small dependently layering gallstones, has marked wall thickening and enhancement and pericholecystic fluid consistent with acute cholecystitis. Normal caliber and course of the common bile duct. Small nonobstructing distal common bile duct stones. Cystic duct stones are also suspected. Pancreas: No mass, inflammation or ductal dilatation. Small amount of fluid surrounding the pancreas and in the right anterior pararenal space likely related to the gallbladder pathology. No findings suspicious for acute pancreatitis but recommend correlation with lipase and amylase levels. Spleen:  Normal size.  No focal lesions. Adrenals/Urinary Tract: Normal adrenal glands. Both kidneys are normal. Stomach/Bowel: The stomach, duodenum, small bowel and colon are unremarkable. Vascular/Lymphatic: The aorta and branch vessels are patent. The major venous structures are patent. No mesenteric or retroperitoneal mass or adenopathy. Other: Right upper quadrant fluid and inflammation without overt ascites. Musculoskeletal: No significant bony findings. IMPRESSION: 1. MR findings consistent with acute cholecystitis. 2. Small nonobstructing distal common bile duct stones. Cystic duct stones are also suspected. 3. Small amount of fluid surrounding the pancreas and in the right anterior pararenal space likely related to the gallbladder pathology. No findings suspicious for acute pancreatitis but recommend correlation with lipase and amylase   levels. Electronically Signed   By: P.  Gallerani M.D.   On: 05/21/2023 10:04   US Abdomen Limited RUQ (LIVER/GB)  Result Date: 05/21/2023 CLINICAL DATA:  66-year-old male with choledocholithiasis on CT, abnormal gallbladder. EXAM: ULTRASOUND ABDOMEN LIMITED RIGHT UPPER QUADRANT COMPARISON:  CT Abdomen and Pelvis  yesterday. FINDINGS: Gallbladder: Abnormal gallbladder wall thickening up to 7 mm. Similar mildly distended appearance of the gallbladder. Echogenic sludge and some shadowing stones within the gallbladder lumen. Stones measure up to 10 mm diameter. No sonographic Murphy sign elicited. Common bile duct: Diameter: 9-10 mm, abnormally dilated. No discrete stone identified within the duct on these images. But questionable sludge within the duct (image 37). Liver: Mild intrahepatic biliary ductal dilatation appears stable. No discrete liver lesion. Background echogenicity within normal limits. Portal vein is patent on color Doppler imaging with normal direction of blood flow towards the liver. Other: Trace perihepatic free fluid (image 61). Negative visible right kidney. IMPRESSION: 1. Ongoing dilated CBD, gallbladder distension and abnormal wall thickening, and gallstones and sludge. Choledocholithiasis less well visualized by ultrasound when compared to the CT yesterday. But appearance otherwise not significantly changed. 2. Trace perihepatic free fluid now. Electronically Signed   By: H  Hall M.D.   On: 05/21/2023 08:03   CT ABDOMEN PELVIS W CONTRAST  Result Date: 05/20/2023 CLINICAL DATA:  Abdominal pain, acute, nonlocalized EXAM: CT ABDOMEN AND PELVIS WITH CONTRAST TECHNIQUE: Multidetector CT imaging of the abdomen and pelvis was performed using the standard protocol following bolus administration of intravenous contrast. RADIATION DOSE REDUCTION: This exam was performed according to the departmental dose-optimization program which includes automated exposure control, adjustment of the mA and/or kV according to patient size and/or use of iterative reconstruction technique. CONTRAST:  100mL OMNIPAQUE IOHEXOL 300 MG/ML  SOLN COMPARISON:  None Available. FINDINGS: Lower chest: No acute abnormality Hepatobiliary: Gallbladder is distended. Layering small stones within the gallbladder. There is gallbladder wall  thickening and pericholecystic fluid. There is concern for possible small stones within the common bile duct. Mild intrahepatic biliary ductal dilatation. No focal hepatic abnormality. Pancreas: No focal abnormality or ductal dilatation. Spleen: No focal abnormality.  Normal size. Adrenals/Urinary Tract: No adrenal abnormality. No focal renal abnormality. No stones or hydronephrosis. Urinary bladder is unremarkable. Stomach/Bowel: Stomach, Sigmoid diverticulosis. No active diverticulitis. Stomach and small bowel decompressed, unremarkable. Vascular/Lymphatic: Aortic atherosclerosis. No evidence of aneurysm or adenopathy. Reproductive: Prostate enlargement. Other: No free fluid or free air. Musculoskeletal: No acute bony abnormality. IMPRESSION: Gallbladder distension with gallbladder wall thickening, pericholecystic fluid and layering stones. Findings concerning for acute cholecystitis. There appears to be high density material within the common bile duct concerning for ductal stones. This could be further evaluated with right upper quadrant ultrasound if felt clinically indicated. Aortic atherosclerosis. Prostate enlargement. Electronically Signed   By: Kevin  Dover M.D.   On: 05/20/2023 19:53    Review of Systems  Constitutional: Negative.   HENT: Negative.    Eyes: Negative.   Respiratory: Negative.    Cardiovascular: Negative.   Gastrointestinal:  Positive for abdominal pain and nausea. Negative for abdominal distention, anal bleeding, blood in stool, constipation, diarrhea and rectal pain.  Endocrine: Negative.   Genitourinary: Negative.   Musculoskeletal:  Positive for arthralgias. Negative for joint swelling.  Allergic/Immunologic: Negative.   Neurological: Negative.   Hematological: Negative.   Psychiatric/Behavioral: Negative.     Blood pressure (!) 157/102, pulse 85, temperature 98.1 F (36.7 C), temperature source Oral, resp. rate (!) 22, height 5' 8" (1.727 m), weight 93.2   kg, SpO2 99  %. Physical Exam Constitutional:      General: He is not in acute distress.    Appearance: He is well-developed.  HENT:     Head: Normocephalic and atraumatic.     Mouth/Throat:     Mouth: Mucous membranes are moist.  Eyes:     General: Scleral icterus present.     Extraocular Movements: Extraocular movements intact.     Pupils: Pupils are equal, round, and reactive to light.  Cardiovascular:     Rate and Rhythm: Normal rate and regular rhythm.  Abdominal:     General: There is no distension.     Palpations: Abdomen is soft.     Tenderness: There is no abdominal tenderness.  Skin:    General: Skin is warm and dry.  Neurological:     General: No focal deficit present.     Mental Status: He is alert and oriented to person, place, and time.    Assessment/Plan: 1) Abnormal CT scan/abnormal MRCP with choledocholithiasis and possible cystic duct stones with acute cholecystitis-ERCP planned tomorrow by Dr. Patrick Hung.  We will hold the heparin 4 hours prior to the procedure.  2) GERD. 3) Atrial fibrillation on Eliquis. 4) Hypothyroidism. 5) Anxiety disorder. 6_Obstructive sleep apnea. 7) Overactive bladder.  Gwynne Kemnitz 05/21/2023, 3:15 PM      

## 2023-05-21 NOTE — TOC CM/SW Note (Signed)
Transition of Care [facility]) - Inpatient Brief Assessment   Patient Details  Name: Roberto Sanford MRN: [id number] Date of Birth: 10-22-1956  Transition of Care [facility]) CM/SW Contact:    Howell Rucks, RN Phone Number: 05/21/2023, 4:50 PM   Clinical Narrative: Met with pt at  bedside. TOC Brief Assessment completed. No TOC needs identified at this time.    Transition of Care Asessment: Insurance and Status: Insurance coverage has been reviewed Patient has primary care physician: Yes Home environment has been reviewed: private residence with spouse Prior level of function:: Independent Prior/Current Home Services: No current home services Social Determinants of Health Reivew: SDOH reviewed no interventions necessary Readmission risk has been reviewed: Yes Transition of care needs: no transition of care needs at this time

## 2023-05-21 NOTE — Progress Notes (Signed)
ANTICOAGULATION CONSULT NOTE - Initial Consult  Pharmacy Consult for heparin Indication: atrial fibrillation  No Known Allergies  Patient Measurements: Height: 5\' 8"  (172.7 cm) Weight: 95.3 kg (210 lb) IBW/kg (Calculated) : 68.4 Heparin Dosing Weight:   Vital Signs: Temp: 98.2 F (36.8 C) (06/12 0445) Temp Source: Oral (06/12 0445) BP: 157/114 (06/12 0445) Pulse Rate: 76 (06/12 0445)  Labs: Recent Labs    05/20/23 1759  HGB 15.2  HCT 45.1  PLT 262  CREATININE 0.80    Estimated Creatinine Clearance: 101.8 mL/min (by C-G formula based on SCr of 0.8 mg/dL).   Medical History: Past Medical History:  Diagnosis Date   Anxiety    Arrhythmia    Arthritis    Atrial fibrillation (HCC)    GERD (gastroesophageal reflux disease)    Hypothyroidism    Overactive bladder    takes vesicare and elavil   Sleep apnea    uses CPAP nightly     Assessment:  67 y.o. male with medical history significant for permanent atrial fibrillation on Eliquis, hypothyroidism, GERD, chronic anxiety/depression, OSA on CPAP, who initially presented to [facility] ED with complaints of severe generalized abdominal pain that started yesterday. CT abdomen and pelvis with contrast revealed findings suggestive of acute cholecystitis and possible choledocholithiasis.  Pharmacy to dose heparin while apixaban on hold for possible procedure.  Home dose apixaban 5mg  po bid--LD unknown CBC WNL, Scr 0.8   Goal of Therapy:  Heparin level 0.3-0.7 units/ml aPTT 66-102 seconds Monitor platelets by anticoagulation protocol: Yes   Plan:  Baseline labs ordered STAT Start heparin drip at 1350 units/hr aPTT/HL in 6 hours Daily CBC   Arley Phenix RPh 05/21/2023, 5:18 AM

## 2023-05-21 NOTE — Progress Notes (Signed)
67 year old male with history of atrial fibrillation on Eliquis obstructive sleep apnea admitted with abdominal pain found to have acute cholecystitis with small nonobstructing distal common bile duct stones.  Cystic duct stones also suspected.  Small amount of fluid surrounding the pancreas and in the right anterior pararenal space likely related to gallbladder pathology. Appreciate GI consult and surgery consult. Labs today sodium 132 potassium 4.3 BUN is 10 creatinine 0.82 AST is 201 ALT 224 total bilirubin up to 8 Normal white count Continue IV fluids IV antibiotics follow-up labs in a.m. N.p.o.

## 2023-05-21 NOTE — Progress Notes (Signed)
ANTICOAGULATION CONSULT NOTE - Follow Up Consult  Pharmacy Consult for Heparin Indication: atrial fibrillation  No Known Allergies  Patient Measurements: Height: 5\' 8"  (172.7 cm) Weight: 93.2 kg (205 lb 8 oz) IBW/kg (Calculated) : 68.4 Heparin Dosing Weight: 88kg  Vital Signs: Temp: 98.1 F (36.7 C) (06/12 1228) Temp Source: Oral (06/12 1228) BP: 157/102 (06/12 1228) Pulse Rate: 85 (06/12 1228)  Labs: Recent Labs    05/20/23 1759 05/21/23 0642 05/21/23 1242  HGB 15.2 15.5  --   HCT 45.1 47.1  --   PLT 262 247  --   APTT  --  36 71*  HEPARINUNFRC  --  0.13* 0.41  CREATININE 0.80 0.82  --     Estimated Creatinine Clearance: 98.1 mL/min (by C-G formula based on SCr of 0.82 mg/dL).   Medications:  Infusions:   sodium chloride 50 mL/hr at 05/21/23 0630   heparin 1,350 Units/hr (05/21/23 0102)   piperacillin-tazobactam 3.375 g (05/21/23 0631)    Assessment: 66 yoM admtted on 6/11 with severe generalized abdominal pain. CT shows acute cholecystitis and possible choledocholithiasis. PMH of Afib on chronic Eliquis, hypothyroidism, GERD, chronic anxiety/depression, OSA on CPAP.  Pharmacy is consulted to dose heparin while Eliquis is on hold for possible procedures.    PTA Eliquis 5mg  PO BID.  Last dose reportedly on 05/19/23 AM.    Today, 05/21/2023: Heparin level 0.41, therapeutic on heparin 1350 units/hr - not falsely supratherapeutic as expected from recent DOAC use. - aPTT 71, also therapeutic  CBC: Hgb and Plt stable/WNL SCr 0.82   Goal of Therapy:  Heparin level 0.3-0.7 units/ml aPTT 66-102 seconds Monitor platelets by anticoagulation protocol: Yes   Plan:  Continue heparin IV infusion at 1350 units/hr Confirmatory heparin level in 6 hours Daily heparin level and CBC Follow up hold time for heparin.  Per [facility], Await GI consultation for possible ERCP. Anticipate will need cholecystectomy.   Lynann Beaver PharmD, BCPS [facility] main pharmacy [phone] 05/21/2023  1:46 PM

## 2023-05-22 ENCOUNTER — Inpatient Hospital Stay (HOSPITAL_COMMUNITY): Payer: PPO

## 2023-05-22 ENCOUNTER — Inpatient Hospital Stay (HOSPITAL_COMMUNITY): Payer: PPO | Admitting: Anesthesiology

## 2023-05-22 ENCOUNTER — Encounter (HOSPITAL_COMMUNITY)
Admission: EM | Disposition: A | Discharge status: Home / Self Care | Payer: Self-pay | Source: Home / Self Care | Attending: Internal Medicine

## 2023-05-22 DIAGNOSIS — K8000 Calculus of gallbladder with acute cholecystitis without obstruction: Secondary | ICD-10-CM | POA: Diagnosis not present

## 2023-05-22 DIAGNOSIS — E039 Hypothyroidism, unspecified: Secondary | ICD-10-CM

## 2023-05-22 DIAGNOSIS — I10 Essential (primary) hypertension: Secondary | ICD-10-CM

## 2023-05-22 DIAGNOSIS — K8051 Calculus of bile duct without cholangitis or cholecystitis with obstruction: Secondary | ICD-10-CM

## 2023-05-22 DIAGNOSIS — I4891 Unspecified atrial fibrillation: Secondary | ICD-10-CM

## 2023-05-22 HISTORY — PX: ERCP: SHX5425

## 2023-05-22 HISTORY — PX: SPHINCTEROTOMY: SHX5279

## 2023-05-22 HISTORY — PX: REMOVAL OF STONES: SHX5545

## 2023-05-22 LAB — CBC
HCT: 40.8 % (ref 39.0–52.0)
Hemoglobin: 13.7 g/dL (ref 13.0–17.0)
MCH: 30.5 pg (ref 26.0–34.0)
MCHC: 33.6 g/dL (ref 30.0–36.0)
MCV: 90.9 fL (ref 80.0–100.0)
Platelets: 229 10*3/uL (ref 150–400)
RBC: 4.49 MIL/uL (ref 4.22–5.81)
RDW: 13.7 % (ref 11.5–15.5)
WBC: 7.1 10*3/uL (ref 4.0–10.5)
nRBC: 0 % (ref 0.0–0.2)

## 2023-05-22 LAB — COMPREHENSIVE METABOLIC PANEL
ALT: 138 U/L — ABNORMAL HIGH (ref 0–44)
AST: 104 U/L — ABNORMAL HIGH (ref 15–41)
Albumin: 3.1 g/dL — ABNORMAL LOW (ref 3.5–5.0)
Alkaline Phosphatase: 274 U/L — ABNORMAL HIGH (ref 38–126)
Anion gap: 8 (ref 5–15)
BUN: 15 mg/dL (ref 8–23)
CO2: 25 mmol/L (ref 22–32)
Calcium: 8.4 mg/dL — ABNORMAL LOW (ref 8.9–10.3)
Chloride: 101 mmol/L (ref 98–111)
Creatinine, Ser: 0.96 mg/dL (ref 0.61–1.24)
GFR, Estimated: >60 mL/min (ref >60)
Glucose, Bld: 99 mg/dL (ref 70–99)
Potassium: 4 mmol/L (ref 3.5–5.1)
Sodium: 134 mmol/L — ABNORMAL LOW (ref 135–145)
Total Bilirubin: 8.1 mg/dL — ABNORMAL HIGH (ref 0.3–1.2)
Total Protein: 6.5 g/dL (ref 6.5–8.1)

## 2023-05-22 LAB — GLUCOSE, CAPILLARY
Glucose-Capillary: 104 mg/dL — ABNORMAL HIGH (ref 70–99)
Glucose-Capillary: 149 mg/dL — ABNORMAL HIGH (ref 70–99)
Glucose-Capillary: 178 mg/dL — ABNORMAL HIGH (ref 70–99)
Glucose-Capillary: 75 mg/dL (ref 70–99)
Glucose-Capillary: 89 mg/dL (ref 70–99)
Glucose-Capillary: 94 mg/dL (ref 70–99)

## 2023-05-22 LAB — HEPARIN LEVEL (UNFRACTIONATED): Heparin Unfractionated: 0.35 IU/mL (ref 0.30–0.70)

## 2023-05-22 LAB — APTT: aPTT: 72 seconds — ABNORMAL HIGH (ref 24–36)

## 2023-05-22 SURGERY — ERCP, WITH INTERVENTION IF INDICATED
Anesthesia: General

## 2023-05-22 MED ORDER — AMISULPRIDE (ANTIEMETIC) 5 MG/2ML IV SOLN: 10.0000 mg | Freq: Once | INTRAVENOUS | Status: DC | PRN

## 2023-05-22 MED ORDER — FENTANYL CITRATE (PF) 100 MCG/2ML IJ SOLN
25.0000 ug | INTRAMUSCULAR | Status: DC | PRN
  Administered 2023-05-22: 100 ug via INTRAVENOUS

## 2023-05-22 MED ORDER — ROCURONIUM BROMIDE 10 MG/ML (PF) SYRINGE
PREFILLED_SYRINGE | INTRAVENOUS | Status: DC | PRN
  Administered 2023-05-22: 50 mg via INTRAVENOUS
  Administered 2023-05-22: 20 mg via INTRAVENOUS

## 2023-05-22 MED ORDER — DEXAMETHASONE SODIUM PHOSPHATE 10 MG/ML IJ SOLN
INTRAMUSCULAR | Status: DC | PRN
  Administered 2023-05-22: 10 mg via INTRAVENOUS

## 2023-05-22 MED ORDER — HEPARIN (PORCINE) 25000 UT/250ML-% IV SOLN
1350.0000 [IU]/h | INTRAVENOUS | Status: AC
  Filled 2023-05-22: qty 250

## 2023-05-22 MED ORDER — LIDOCAINE 2% (20 MG/ML) 5 ML SYRINGE
INTRAMUSCULAR | Status: DC | PRN
  Administered 2023-05-22: 100 mg via INTRAVENOUS

## 2023-05-22 MED ORDER — SODIUM CHLORIDE 0.9 % IV SOLN
INTRAVENOUS | Status: DC | PRN
  Administered 2023-05-22: 25 mL

## 2023-05-22 MED ORDER — PHENYLEPHRINE 80 MCG/ML (10ML) SYRINGE FOR IV PUSH (FOR BLOOD PRESSURE SUPPORT)
PREFILLED_SYRINGE | INTRAVENOUS | Status: DC | PRN
  Administered 2023-05-22: 160 ug via INTRAVENOUS
  Administered 2023-05-22: 80 ug via INTRAVENOUS
  Administered 2023-05-22 (×2): 160 ug via INTRAVENOUS

## 2023-05-22 MED ORDER — PROPOFOL 10 MG/ML IV BOLUS
INTRAVENOUS | Status: DC | PRN
  Administered 2023-05-22: 150 mg via INTRAVENOUS

## 2023-05-22 MED ORDER — MIDAZOLAM HCL 5 MG/5ML IJ SOLN
INTRAMUSCULAR | Status: DC | PRN
  Administered 2023-05-22: 2 mg via INTRAVENOUS

## 2023-05-22 MED ORDER — DICLOFENAC SUPPOSITORY 100 MG
RECTAL | Status: DC | PRN
  Administered 2023-05-22: 100 mg via RECTAL

## 2023-05-22 MED ORDER — SUGAMMADEX SODIUM 200 MG/2ML IV SOLN
INTRAVENOUS | Status: DC | PRN
  Administered 2023-05-22: 200 mg via INTRAVENOUS

## 2023-05-22 MED ORDER — DICLOFENAC SUPPOSITORY 100 MG
RECTAL | Status: AC
  Filled 2023-05-22: qty 1

## 2023-05-22 MED ORDER — LACTATED RINGERS IV SOLN: INTRAVENOUS | Status: DC

## 2023-05-22 MED ORDER — GLUCAGON HCL RDNA (DIAGNOSTIC) 1 MG IJ SOLR
INTRAMUSCULAR | Status: AC
  Filled 2023-05-22: qty 1

## 2023-05-22 NOTE — Anesthesia Procedure Notes (Signed)
Procedure Name: Intubation Date/Time: 05/22/2023 2:00 PM  Performed by: Basilio Cairo, CRNAPre-anesthesia Checklist: Patient identified, Patient being monitored, Timeout performed, Emergency Drugs available and Suction available Patient Re-evaluated:Patient Re-evaluated prior to induction Oxygen Delivery Method: Circle system utilized Preoxygenation: Pre-oxygenation with 100% oxygen Induction Type: IV induction Ventilation: Mask ventilation without difficulty Laryngoscope Size: Mac and 3 Grade View: Grade I Tube type: Oral Tube size: 7.0 mm Number of attempts: 1 Airway Equipment and Method: Stylet Placement Confirmation: ETT inserted through vocal cords under direct vision, positive ETCO2 and breath sounds checked- equal and bilateral Secured at: 21 cm Tube secured with: Tape Dental Injury: Teeth and Oropharynx as per pre-operative assessment

## 2023-05-22 NOTE — Plan of Care (Signed)
  Problem: Clinical Measurements: Goal: Diagnostic test results will improve Outcome: Progressing   Problem: Activity: Goal: Risk for activity intolerance will decrease Outcome: Progressing   Problem: Coping: Goal: Level of anxiety will decrease Outcome: Progressing   

## 2023-05-22 NOTE — Anesthesia Postprocedure Evaluation (Signed)
Anesthesia Post Note  Patient: Roberto Sanford  Procedure(s) Performed: ENDOSCOPIC RETROGRADE CHOLANGIOPANCREATOGRAPHY (ERCP) SPHINCTEROTOMY REMOVAL OF STONES     Patient location during evaluation: PACU Anesthesia Type: General Level of consciousness: awake Pain management: pain level controlled Vital Signs Assessment: post-procedure vital signs reviewed and stable Respiratory status: spontaneous breathing, nonlabored ventilation and respiratory function stable Cardiovascular status: blood pressure returned to baseline and stable Postop Assessment: no apparent nausea or vomiting Anesthetic complications: no   No notable events documented.  Last Vitals:  Vitals:   05/22/23 1450 05/22/23 1500  BP: (!) 128/52 119/74  Pulse: 82 99  Resp: 13 16  Temp: 36.4 C   SpO2: 100% 98%    Last Pain:  Vitals:   05/22/23 1500  TempSrc:   PainSc: 0-No pain                 Linton Rump

## 2023-05-22 NOTE — Progress Notes (Signed)
   05/22/23 0126  BiPAP/CPAP/SIPAP  BiPAP/CPAP/SIPAP Pt Type Adult  BiPAP/CPAP/SIPAP DREAMSTATIOND  Mask Type Nasal mask  Set Rate 0 breaths/min  Respiratory Rate 18 breaths/min  Patient Home Equipment No  Auto Titrate Yes

## 2023-05-22 NOTE — Progress Notes (Signed)
PROGRESS NOTE    Roberto Sanford  ZOX:[id number] DOB: [birth year] DOA: 05/20/2023 PCP: Merri Brunette, MD   Brief Narrative: 67 year old male with history of A-fib on Eliquis, hypothyroidism, obstructive sleep apnea on CPAP anxiety depression admitted with abdominal pain jaundice nausea without vomiting.  He was found to have acute cholecystitis by CT with possible choledocholithiasis.  MRCP with CBD and cystic duct stones.  Patient scheduled to have ERCP 05/22/2023.  GI and general surgery following.  Assessment & Plan:   Principal Problem:   Acute calculous cholecystitis   #1 acute cholecystitis with choledocholithiasis for ERCP today He has been off of Eliquis since 6/ 9. Surgery following possible lap chole tomorrow N.p.o. after midnight LFTs remains about the same with T. bili 8 Continue Zosyn   #2 permanent atrial fibrillation on Eliquis at home currently on hold Rate controlled  #3 hypothyroidism on levothyroxine  #4 anxiety/depression on amitriptyline  #5 obstructive sleep apnea  #6 hyponatremia stable monitor continue NS  #7 hyperlipidemia on Crestor  #8 hypertension now with soft blood pressure 106/58 Continue IV fluids patient has been n.p.o. Hold Lasix Hyzaar which she takes at home  Miscellaneous restart oral medications when he is able to take p.o.  Estimated body mass index is 31.25 kg/m as calculated from the following:   Height as of this encounter: 5\' 8"  (1.727 m).   Weight as of this encounter: 93.2 kg.  DVT prophylaxis: Heparin  code Status: Full code  family Communication: None at bedside  disposition Plan:  Status is: Inpatient Remains inpatient appropriate because: Acute calculus cholecystitis with CBD and and cystic duct stones   Consultants:  GI and general surgery  Procedures: MRCP For ERCP on 05/22/2023 Antimicrobials: Zosyn  Subjective: Patient resting in bed no new complaints denies abdominal pain has been  n.p.o.  Objective: Vitals:   05/22/23 1128 05/22/23 1144 05/22/23 1152 05/22/23 1316  BP: (!) 82/59 (!) 89/62  (!) 106/58  Pulse: 86   70  Resp: 18   18  Temp: (!) 97.5 F (36.4 C)  98.4 F (36.9 C) 98.6 F (37 C)  TempSrc: Oral  Oral Temporal  SpO2: 100%   99%  Weight:      Height:        Intake/Output Summary (Last 24 hours) at 05/22/2023 1353 Last data filed at 05/22/2023 8119 Gross per 24 hour  Intake 2056.03 ml  Output 1350 ml  Net 706.03 ml   Filed Weights   05/20/23 1734 05/21/23 0657  Weight: 95.3 kg 93.2 kg    Examination:  General exam: Appears in no acute distress appears jaundiced Respiratory system: Clear to auscultation. Respiratory effort normal. Cardiovascular system: S1 & S2 heard, RRR. No JVD, murmurs, rubs, gallops or clicks. No pedal edema. Gastrointestinal system: Abdomen is nondistended, soft and nontender. No organomegaly or masses felt. Normal bowel sounds heard. Central nervous system: Alert and oriented. No focal neurological deficits. Extremities: Symmetric 5 x 5 power. Skin: No rashes, lesions or ulcers Psychiatry: Judgement and insight appear normal. Mood & affect appropriate.     Data Reviewed: I have personally reviewed following labs and imaging studies  CBC: Recent Labs  Lab 05/20/23 1759 05/21/23 0642 05/22/23 0520  WBC 10.2 9.2 7.1  NEUTROABS 8.9*  --   --   HGB 15.2 15.5 13.7  HCT 45.1 47.1 40.8  MCV 90.2 90.9 90.9  PLT 262 247 229   Basic Metabolic Panel: Recent Labs  Lab 05/20/23 1759 05/21/23 0642 05/22/23 1478  NA 129* 132* 134*  K 4.2 4.3 4.0  CL 95* 97* 101  CO2 23 26 25   GLUCOSE 150* 105* 99  BUN 11 10 15   CREATININE 0.80 0.82 0.96  CALCIUM 8.9 8.8* 8.4*  MG  --  2.2  --   PHOS  --  3.8  --    GFR: Estimated Creatinine Clearance: 83.8 mL/min (by C-G formula based on SCr of 0.96 mg/dL). Liver Function Tests: Recent Labs  Lab 05/20/23 1759 05/21/23 0642 05/22/23 0702  AST 313* 201* 104*  ALT  221* 224* 138*  ALKPHOS 202* 238* 274*  BILITOT 3.4* 8.0* 8.1*  PROT 8.3* 7.4 6.5  ALBUMIN 4.2 3.7 3.1*   Recent Labs  Lab 05/20/23 1759  LIPASE 32   No results for input(s): "AMMONIA" in the last 168 hours. Coagulation Profile: No results for input(s): "INR", "PROTIME" in the last 168 hours. Cardiac Enzymes: No results for input(s): "CKTOTAL", "CKMB", "CKMBINDEX", "TROPONINI" in the last 168 hours. BNP (last 3 results) No results for input(s): "PROBNP" in the last 8760 hours. HbA1C: Recent Labs    05/21/23 0513  HGBA1C 5.0   CBG: Recent Labs  Lab 05/21/23 2042 05/22/23 0003 05/22/23 0430 05/22/23 0722 05/22/23 1125  GLUCAP 140* 149* 104* 94 89   Lipid Profile: No results for input(s): "CHOL", "HDL", "LDLCALC", "TRIG", "CHOLHDL", "LDLDIRECT" in the last 72 hours. Thyroid Function Tests: No results for input(s): "TSH", "T4TOTAL", "FREET4", "T3FREE", "THYROIDAB" in the last 72 hours. Anemia Panel: No results for input(s): "VITAMINB12", "FOLATE", "FERRITIN", "TIBC", "IRON", "RETICCTPCT" in the last 72 hours. Sepsis Labs: Recent Labs  Lab 05/20/23 1745 05/20/23 2128  LATICACIDVEN 2.1* 1.6    Recent Results (from the past 240 hour(s))  Blood culture (routine x 2)     Status: None (Preliminary result)   Collection Time: 05/20/23  7:25 PM   Specimen: BLOOD  Result Value Ref Range Status   Specimen Description   Final    BLOOD LEFT ANTECUBITAL Performed at [facility], [address] [place], [place] [address]    Special Requests   Final    BOTTLES DRAWN AEROBIC AND ANAEROBIC Blood Culture adequate volume Performed at [facility], [address] [place], [place] [address]    Culture   Final    NO GROWTH 2 DAYS Performed at [facility], [address], [place], [place] [address]    Report Status PENDING  Incomplete  Blood culture (routine x 2)     Status: None (Preliminary result)   Collection Time: 05/20/23  7:30 PM    Specimen: BLOOD  Result Value Ref Range Status   Specimen Description   Final    BLOOD RIGHT ANTECUBITAL Performed at [facility], [address] [place], [place] [address]    Special Requests   Final    BOTTLES DRAWN AEROBIC AND ANAEROBIC Blood Culture adequate volume Performed at [facility], [address] [place], [place] [address]    Culture   Final    NO GROWTH 2 DAYS Performed at [facility], [address] [place], [place] [address]    Report Status PENDING  Incomplete         Radiology Studies: MR ABDOMEN WITH MRCP W CONTRAST  Result Date: 05/21/2023 CLINICAL DATA:  Right upper quadrant abdominal pain. Abnormal CT and ultrasound examinations. EXAM: MRI ABDOMEN WITHOUT AND WITH CONTRAST (INCLUDING MRCP) TECHNIQUE: Multiplanar multisequence MR imaging of  the abdomen was performed both before and after the administration of intravenous contrast. Heavily T2-weighted images of the biliary and pancreatic ducts were obtained, and three-dimensional MRCP images were rendered by post processing. CONTRAST:  10mL GADAVIST GADOBUTROL 1 MMOL/ML IV SOLN COMPARISON:  CT scan 05/20/2023 and ultrasound 05/21/2023. FINDINGS: Lower chest: The lung bases are clear of an acute process. No pulmonary lesions or pleural effusions. No pericardial effusion. Hepatobiliary: No hepatic lesions or intrahepatic biliary dilatation. The gallbladder is distended, demonstrates numerous small dependently layering gallstones, has marked wall thickening and enhancement and pericholecystic fluid consistent with acute cholecystitis. Normal caliber and course of the common bile duct. Small nonobstructing distal common bile duct stones. Cystic duct stones are also suspected. Pancreas: No mass, inflammation or ductal dilatation. Small amount of fluid surrounding the pancreas and in the right anterior pararenal space likely related to the gallbladder pathology. No findings suspicious for acute  pancreatitis but recommend correlation with lipase and amylase levels. Spleen:  Normal size.  No focal lesions. Adrenals/Urinary Tract: Normal adrenal glands. Both kidneys are normal. Stomach/Bowel: The stomach, duodenum, small bowel and colon are unremarkable. Vascular/Lymphatic: The aorta and branch vessels are patent. The major venous structures are patent. No mesenteric or retroperitoneal mass or adenopathy. Other: Right upper quadrant fluid and inflammation without overt ascites. Musculoskeletal: No significant bony findings. IMPRESSION: 1. MR findings consistent with acute cholecystitis. 2. Small nonobstructing distal common bile duct stones. Cystic duct stones are also suspected. 3. Small amount of fluid surrounding the pancreas and in the right anterior pararenal space likely related to the gallbladder pathology. No findings suspicious for acute pancreatitis but recommend correlation with lipase and amylase levels. Electronically Signed   By: Rudie Meyer M.D.   On: 05/21/2023 10:04   MR 3D Recon At Scanner  Result Date: 05/21/2023 CLINICAL DATA:  Right upper quadrant abdominal pain. Abnormal CT and ultrasound examinations. EXAM: MRI ABDOMEN WITHOUT AND WITH CONTRAST (INCLUDING MRCP) TECHNIQUE: Multiplanar multisequence MR imaging of the abdomen was performed both before and after the administration of intravenous contrast. Heavily T2-weighted images of the biliary and pancreatic ducts were obtained, and three-dimensional MRCP images were rendered by post processing. CONTRAST:  10mL GADAVIST GADOBUTROL 1 MMOL/ML IV SOLN COMPARISON:  CT scan 05/20/2023 and ultrasound 05/21/2023. FINDINGS: Lower chest: The lung bases are clear of an acute process. No pulmonary lesions or pleural effusions. No pericardial effusion. Hepatobiliary: No hepatic lesions or intrahepatic biliary dilatation. The gallbladder is distended, demonstrates numerous small dependently layering gallstones, has marked wall thickening and  enhancement and pericholecystic fluid consistent with acute cholecystitis. Normal caliber and course of the common bile duct. Small nonobstructing distal common bile duct stones. Cystic duct stones are also suspected. Pancreas: No mass, inflammation or ductal dilatation. Small amount of fluid surrounding the pancreas and in the right anterior pararenal space likely related to the gallbladder pathology. No findings suspicious for acute pancreatitis but recommend correlation with lipase and amylase levels. Spleen:  Normal size.  No focal lesions. Adrenals/Urinary Tract: Normal adrenal glands. Both kidneys are normal. Stomach/Bowel: The stomach, duodenum, small bowel and colon are unremarkable. Vascular/Lymphatic: The aorta and branch vessels are patent. The major venous structures are patent. No mesenteric or retroperitoneal mass or adenopathy. Other: Right upper quadrant fluid and inflammation without overt ascites. Musculoskeletal: No significant bony findings. IMPRESSION: 1. MR findings consistent with acute cholecystitis. 2. Small nonobstructing distal common bile duct stones. Cystic duct stones are also suspected. 3. Small amount of fluid surrounding the pancreas and in  the right anterior pararenal space likely related to the gallbladder pathology. No findings suspicious for acute pancreatitis but recommend correlation with lipase and amylase levels. Electronically Signed   By: Rudie Meyer M.D.   On: 05/21/2023 10:04   US Abdomen Limited RUQ (LIVER/GB)  Result Date: 05/21/2023 CLINICAL DATA:  67 year old male with choledocholithiasis on CT, abnormal gallbladder. EXAM: ULTRASOUND ABDOMEN LIMITED RIGHT UPPER QUADRANT COMPARISON:  CT Abdomen and Pelvis yesterday. FINDINGS: Gallbladder: Abnormal gallbladder wall thickening up to 7 mm. Similar mildly distended appearance of the gallbladder. Echogenic sludge and some shadowing stones within the gallbladder lumen. Stones measure up to 10 mm diameter. No  sonographic Murphy sign elicited. Common bile duct: Diameter: 9-10 mm, abnormally dilated. No discrete stone identified within the duct on these images. But questionable sludge within the duct (image 37). Liver: Mild intrahepatic biliary ductal dilatation appears stable. No discrete liver lesion. Background echogenicity within normal limits. Portal vein is patent on color Doppler imaging with normal direction of blood flow towards the liver. Other: Trace perihepatic free fluid (image 61). Negative visible right kidney. IMPRESSION: 1. Ongoing dilated CBD, gallbladder distension and abnormal wall thickening, and gallstones and sludge. Choledocholithiasis less well visualized by ultrasound when compared to the CT yesterday. But appearance otherwise not significantly changed. 2. Trace perihepatic free fluid now. Electronically Signed   By: Odessa Fleming M.D.   On: 05/21/2023 08:03   CT ABDOMEN PELVIS W CONTRAST  Result Date: 05/20/2023 CLINICAL DATA:  Abdominal pain, acute, nonlocalized EXAM: CT ABDOMEN AND PELVIS WITH CONTRAST TECHNIQUE: Multidetector CT imaging of the abdomen and pelvis was performed using the standard protocol following bolus administration of intravenous contrast. RADIATION DOSE REDUCTION: This exam was performed according to the departmental dose-optimization program which includes automated exposure control, adjustment of the mA and/or kV according to patient size and/or use of iterative reconstruction technique. CONTRAST:  OMNIPAQUE IOHEXOL 300 MG/ML  SOLN COMPARISON:  None Available. FINDINGS: Lower chest: No acute abnormality Hepatobiliary: Gallbladder is distended. Layering small stones within the gallbladder. There is gallbladder wall thickening and pericholecystic fluid. There is concern for possible small stones within the common bile duct. Mild intrahepatic biliary ductal dilatation. No focal hepatic abnormality. Pancreas: No focal abnormality or ductal dilatation. Spleen: No focal  abnormality.  Normal size. Adrenals/Urinary Tract: No adrenal abnormality. No focal renal abnormality. No stones or hydronephrosis. Urinary bladder is unremarkable. Stomach/Bowel: Stomach, Sigmoid diverticulosis. No active diverticulitis. Stomach and small bowel decompressed, unremarkable. Vascular/Lymphatic: Aortic atherosclerosis. No evidence of aneurysm or adenopathy. Reproductive: Prostate enlargement. Other: No free fluid or free air. Musculoskeletal: No acute bony abnormality. IMPRESSION: Gallbladder distension with gallbladder wall thickening, pericholecystic fluid and layering stones. Findings concerning for acute cholecystitis. There appears to be high density material within the common bile duct concerning for ductal stones. This could be further evaluated with right upper quadrant ultrasound if felt clinically indicated. Aortic atherosclerosis. Prostate enlargement. Electronically Signed   By: Charlett Nose M.D.   On: 05/20/2023 19:53        Scheduled Meds:  [MAR Hold] amitriptyline  25 mg Oral QHS   [MAR Hold] insulin aspart  0-9 Units Subcutaneous Q4H   [MAR Hold] levothyroxine  100 mcg Oral Q0600   [MAR Hold] multivitamin with minerals  1 tablet Oral Daily   [MAR Hold] rosuvastatin  10 mg Oral Daily   Continuous Infusions:  sodium chloride 75 mL/hr at 05/21/23 1508   sodium chloride Stopped (05/22/23 0324)   lactated ringers 50 mL/hr at 05/22/23 1319   [  MAR Hold] piperacillin-tazobactam 3.375 g (05/22/23 0523)     LOS: 1 day    Time spent: 39 min  Alwyn Ren, MD  05/22/2023, 1:53 PM

## 2023-05-22 NOTE — Transfer of Care (Signed)
Immediate Anesthesia Transfer of Care Note  Patient: Roberto Sanford  Procedure(s) Performed: Procedure(s): ENDOSCOPIC RETROGRADE CHOLANGIOPANCREATOGRAPHY (ERCP) (N/A) SPHINCTEROTOMY REMOVAL OF STONES  Patient Location: PACU  Anesthesia Type:General  Level of Consciousness: Alert, Awake, Oriented  Airway & Oxygen Therapy: Patient Spontanous Breathing  Post-op Assessment: Report given to RN  Post vital signs: Reviewed and stable  Last Vitals:  Vitals:   05/22/23 1152 05/22/23 1316  BP:  (!) 106/58  Pulse:  70  Resp:  18  Temp: 36.9 C 37 C  SpO2:  99%    Complications: No apparent anesthesia complications

## 2023-05-22 NOTE — H&P (View-Only) (Signed)
     Subjective: Getting cleaned up in the bathroom.  Feels about the same.  Ready for ERCP today  ROS: See above, otherwise other systems negative  Objective: Vital signs in last 24 hours: Temp:  [98.1 F (36.7 C)-98.4 F (36.9 C)] 98.4 F (36.9 C) (06/13 0433) Pulse Rate:  [85-100] 94 (06/13 0433) Resp:  [18-22] 18 (06/13 0433) BP: (99-157)/(64-102) 99/64 (06/13 0433) SpO2:  [95 %-100 %] 95 % (06/13 0433) Last BM Date : 05/19/23  Intake/Output from previous day: 06/12 0701 - 06/13 0700 In: 2056 [P.O.:760; I.V.:1127.2; IV Piggyback:168.8] Out: 1525 [Urine:1525] Intake/Output this shift: Total I/O In: 0  Out: 250 [Urine:250]  PE: Gen: NAD Lungs: respiratory effort nonlabored Skin: jaundice  Lab Results:  Recent Labs    05/21/23 0642 05/22/23 0520  WBC 9.2 7.1  HGB 15.5 13.7  HCT 47.1 40.8  PLT 247 229   BMET Recent Labs    05/21/23 0642 05/22/23 0702  NA 132* 134*  K 4.3 4.0  CL 97* 101  CO2 26 25  GLUCOSE 105* 99  BUN 10 15  CREATININE 0.82 0.96  CALCIUM 8.8* 8.4*   PT/INR No results for input(s): "LABPROT", "INR" in the last 72 hours. CMP     Component Value Date/Time   NA 134 (L) 05/22/2023 0702   K 4.0 05/22/2023 0702   CL 101 05/22/2023 0702   CO2 25 05/22/2023 0702   GLUCOSE 99 05/22/2023 0702   BUN 15 05/22/2023 0702   CREATININE 0.96 05/22/2023 0702   CALCIUM 8.4 (L) 05/22/2023 0702   PROT 6.5 05/22/2023 0702   ALBUMIN 3.1 (L) 05/22/2023 0702   AST 104 (H) 05/22/2023 0702   ALT 138 (H) 05/22/2023 0702   ALKPHOS 274 (H) 05/22/2023 0702   BILITOT 8.1 (H) 05/22/2023 0702   GFRNONAA >60 05/22/2023 0702   Lipase     Component Value Date/Time   LIPASE 32 05/20/2023 1759       Studies/Results: MR ABDOMEN WITH MRCP W CONTRAST  Result Date: 05/21/2023 CLINICAL DATA:  Right upper quadrant abdominal pain. Abnormal CT and ultrasound examinations. EXAM: MRI ABDOMEN WITHOUT AND WITH CONTRAST (INCLUDING MRCP) TECHNIQUE: Multiplanar  multisequence MR imaging of the abdomen was performed both before and after the administration of intravenous contrast. Heavily T2-weighted images of the biliary and pancreatic ducts were obtained, and three-dimensional MRCP images were rendered by post processing. CONTRAST:  10mL GADAVIST GADOBUTROL 1 MMOL/ML IV SOLN COMPARISON:  CT scan 05/20/2023 and ultrasound 05/21/2023. FINDINGS: Lower chest: The lung bases are clear of an acute process. No pulmonary lesions or pleural effusions. No pericardial effusion. Hepatobiliary: No hepatic lesions or intrahepatic biliary dilatation. The gallbladder is distended, demonstrates numerous small dependently layering gallstones, has marked wall thickening and enhancement and pericholecystic fluid consistent with acute cholecystitis. Normal caliber and course of the common bile duct. Small nonobstructing distal common bile duct stones. Cystic duct stones are also suspected. Pancreas: No mass, inflammation or ductal dilatation. Small amount of fluid surrounding the pancreas and in the right anterior pararenal space likely related to the gallbladder pathology. No findings suspicious for acute pancreatitis but recommend correlation with lipase and amylase levels. Spleen:  Normal size.  No focal lesions. Adrenals/Urinary Tract: Normal adrenal glands. Both kidneys are normal. Stomach/Bowel: The stomach, duodenum, small bowel and colon are unremarkable. Vascular/Lymphatic: The aorta and branch vessels are patent. The major venous structures are patent. No mesenteric or retroperitoneal mass or adenopathy. Other: Right upper quadrant fluid and inflammation without   overt ascites. Musculoskeletal: No significant bony findings. IMPRESSION: 1. MR findings consistent with acute cholecystitis. 2. Small nonobstructing distal common bile duct stones. Cystic duct stones are also suspected. 3. Small amount of fluid surrounding the pancreas and in the right anterior pararenal space likely related  to the gallbladder pathology. No findings suspicious for acute pancreatitis but recommend correlation with lipase and amylase levels. Electronically Signed   By: P.  Gallerani M.D.   On: 05/21/2023 10:04   MR 3D Recon At Scanner  Result Date: 05/21/2023 CLINICAL DATA:  Right upper quadrant abdominal pain. Abnormal CT and ultrasound examinations. EXAM: MRI ABDOMEN WITHOUT AND WITH CONTRAST (INCLUDING MRCP) TECHNIQUE: Multiplanar multisequence MR imaging of the abdomen was performed both before and after the administration of intravenous contrast. Heavily T2-weighted images of the biliary and pancreatic ducts were obtained, and three-dimensional MRCP images were rendered by post processing. CONTRAST:  10mL GADAVIST GADOBUTROL 1 MMOL/ML IV SOLN COMPARISON:  CT scan 05/20/2023 and ultrasound 05/21/2023. FINDINGS: Lower chest: The lung bases are clear of an acute process. No pulmonary lesions or pleural effusions. No pericardial effusion. Hepatobiliary: No hepatic lesions or intrahepatic biliary dilatation. The gallbladder is distended, demonstrates numerous small dependently layering gallstones, has marked wall thickening and enhancement and pericholecystic fluid consistent with acute cholecystitis. Normal caliber and course of the common bile duct. Small nonobstructing distal common bile duct stones. Cystic duct stones are also suspected. Pancreas: No mass, inflammation or ductal dilatation. Small amount of fluid surrounding the pancreas and in the right anterior pararenal space likely related to the gallbladder pathology. No findings suspicious for acute pancreatitis but recommend correlation with lipase and amylase levels. Spleen:  Normal size.  No focal lesions. Adrenals/Urinary Tract: Normal adrenal glands. Both kidneys are normal. Stomach/Bowel: The stomach, duodenum, small bowel and colon are unremarkable. Vascular/Lymphatic: The aorta and branch vessels are patent. The major venous structures are patent. No  mesenteric or retroperitoneal mass or adenopathy. Other: Right upper quadrant fluid and inflammation without overt ascites. Musculoskeletal: No significant bony findings. IMPRESSION: 1. MR findings consistent with acute cholecystitis. 2. Small nonobstructing distal common bile duct stones. Cystic duct stones are also suspected. 3. Small amount of fluid surrounding the pancreas and in the right anterior pararenal space likely related to the gallbladder pathology. No findings suspicious for acute pancreatitis but recommend correlation with lipase and amylase levels. Electronically Signed   By: P.  Gallerani M.D.   On: 05/21/2023 10:04   US Abdomen Limited RUQ (LIVER/GB)  Result Date: 05/21/2023 CLINICAL DATA:  66-year-old male with choledocholithiasis on CT, abnormal gallbladder. EXAM: ULTRASOUND ABDOMEN LIMITED RIGHT UPPER QUADRANT COMPARISON:  CT Abdomen and Pelvis yesterday. FINDINGS: Gallbladder: Abnormal gallbladder wall thickening up to 7 mm. Similar mildly distended appearance of the gallbladder. Echogenic sludge and some shadowing stones within the gallbladder lumen. Stones measure up to 10 mm diameter. No sonographic Murphy sign elicited. Common bile duct: Diameter: 9-10 mm, abnormally dilated. No discrete stone identified within the duct on these images. But questionable sludge within the duct (image 37). Liver: Mild intrahepatic biliary ductal dilatation appears stable. No discrete liver lesion. Background echogenicity within normal limits. Portal vein is patent on color Doppler imaging with normal direction of blood flow towards the liver. Other: Trace perihepatic free fluid (image 61). Negative visible right kidney. IMPRESSION: 1. Ongoing dilated CBD, gallbladder distension and abnormal wall thickening, and gallstones and sludge. Choledocholithiasis less well visualized by ultrasound when compared to the CT yesterday. But appearance otherwise not significantly changed. 2. Trace perihepatic   free fluid  now. Electronically Signed   By: H  Hall M.D.   On: 05/21/2023 08:03   CT ABDOMEN PELVIS W CONTRAST  Result Date: 05/20/2023 CLINICAL DATA:  Abdominal pain, acute, nonlocalized EXAM: CT ABDOMEN AND PELVIS WITH CONTRAST TECHNIQUE: Multidetector CT imaging of the abdomen and pelvis was performed using the standard protocol following bolus administration of intravenous contrast. RADIATION DOSE REDUCTION: This exam was performed according to the departmental dose-optimization program which includes automated exposure control, adjustment of the mA and/or kV according to patient size and/or use of iterative reconstruction technique. CONTRAST:  100mL OMNIPAQUE IOHEXOL 300 MG/ML  SOLN COMPARISON:  None Available. FINDINGS: Lower chest: No acute abnormality Hepatobiliary: Gallbladder is distended. Layering small stones within the gallbladder. There is gallbladder wall thickening and pericholecystic fluid. There is concern for possible small stones within the common bile duct. Mild intrahepatic biliary ductal dilatation. No focal hepatic abnormality. Pancreas: No focal abnormality or ductal dilatation. Spleen: No focal abnormality.  Normal size. Adrenals/Urinary Tract: No adrenal abnormality. No focal renal abnormality. No stones or hydronephrosis. Urinary bladder is unremarkable. Stomach/Bowel: Stomach, Sigmoid diverticulosis. No active diverticulitis. Stomach and small bowel decompressed, unremarkable. Vascular/Lymphatic: Aortic atherosclerosis. No evidence of aneurysm or adenopathy. Reproductive: Prostate enlargement. Other: No free fluid or free air. Musculoskeletal: No acute bony abnormality. IMPRESSION: Gallbladder distension with gallbladder wall thickening, pericholecystic fluid and layering stones. Findings concerning for acute cholecystitis. There appears to be high density material within the common bile duct concerning for ductal stones. This could be further evaluated with right upper quadrant ultrasound if  felt clinically indicated. Aortic atherosclerosis. Prostate enlargement. Electronically Signed   By: Kevin  Dover M.D.   On: 05/20/2023 19:53    Anti-infectives: Anti-infectives (From admission, onward)    Start     Dose/Rate Route Frequency Ordered Stop   05/21/23 0600  piperacillin-tazobactam (ZOSYN) IVPB 3.375 g        3.375 g 12.5 mL/hr over 240 Minutes Intravenous Every 8 hours 05/21/23 0112     05/20/23 2015  piperacillin-tazobactam (ZOSYN) IVPB 3.375 g        3.375 g 100 mL/hr over 30 Minutes Intravenous  Once 05/20/23 2013 05/20/23 2118        Assessment/Plan Acute calculus cholecystitis, Choledocholithiasis -ERCP today by Dr. Hung -repeat cmet in am -if no complications from ERCP, tentatively plan for lap chole tomorrow -will need to be NPO p MN -will need to hold his heparin in the early am     FEN: N.p.o. p MN, IVF per primary ID: Zosyn VTE: heparin gtt, eliquis on hold   Per primary A-fib on Eliquis (last dose Monday am) OSA GERD Anxiety Arthritis Hypothyroidism  HTN  I reviewed Consultant GI notes, hospitalist notes, last 24 h vitals and pain scores, last 48 h intake and output, last 24 h labs and trends, and last 24 h imaging results.   LOS: 1 day    Lafe Clerk E Elizeth Weinrich , PA-C [facility] 05/22/2023, 10:55 AM Please see Amion for pager number during day hours 7:00am-4:30pm or 7:00am -11:30am on weekends  

## 2023-05-22 NOTE — Progress Notes (Signed)
Mobility Specialist - Progress Note   05/22/23 1006  Mobility  Activity Ambulated with assistance in hallway;Ambulated with assistance to bathroom  Level of Assistance Independent after set-up  Assistive Device Other (Comment) (IV Pole)  Distance Ambulated (ft) 500 ft  Activity Response Tolerated well  Mobility Referral Yes  $Mobility charge 1 Mobility  Mobility Specialist Start Time (ACUTE ONLY) 0955  Mobility Specialist Stop Time (ACUTE ONLY) 1006  Mobility Specialist Time Calculation (min) (ACUTE ONLY) 11 min   Pt received in bed and agreeable to mobility. No complaints during session. Pt to bed after session with all needs met.   [facility]

## 2023-05-22 NOTE — Interval H&P Note (Signed)
History and Physical Interval Note:  05/22/2023 1:28 PM  Roberto Sanford  has presented today for surgery, with the diagnosis of Choledocholithiasis.  The various methods of treatment have been discussed with the patient and family. After consideration of risks, benefits and other options for treatment, the patient has consented to  Procedure(s): ENDOSCOPIC RETROGRADE CHOLANGIOPANCREATOGRAPHY (ERCP) (N/A) as a surgical intervention.  The patient's history has been reviewed, patient examined, no change in status, stable for surgery.  I have reviewed the patient's chart and labs.  Questions were answered to the patient's satisfaction.     Carmellia Kreisler D

## 2023-05-22 NOTE — Progress Notes (Signed)
ANTICOAGULATION CONSULT NOTE - Follow Up Consult  Pharmacy Consult for Heparin Indication: atrial fibrillation  No Known Allergies  Patient Measurements: Height: 5\' 8"  (172.7 cm) Weight: 93.2 kg (205 lb 8 oz) IBW/kg (Calculated) : 68.4 Heparin Dosing Weight: 88kg  Vital Signs: Temp: 98.4 F (36.9 C) (06/13 0433) Temp Source: Oral (06/13 0433) BP: 99/64 (06/13 0433) Pulse Rate: 94 (06/13 0433)  Labs: Recent Labs    05/20/23 1759 05/20/23 1759 05/21/23 4098 05/21/23 1242 05/21/23 1826 05/22/23 0520 05/22/23 0702  HGB 15.2  --  15.5  --   --  13.7  --   HCT 45.1  --  47.1  --   --  40.8  --   PLT 262  --  247  --   --  229  --   APTT  --   --  36 71*  --  72*  --   HEPARINUNFRC  --    < > 0.13* 0.41 0.46 0.35  --   CREATININE 0.80  --  0.82  --   --   --  0.96   < > = values in this interval not displayed.     Estimated Creatinine Clearance: 83.8 mL/min (by C-G formula based on SCr of 0.96 mg/dL).   Medications:  Infusions:   sodium chloride 75 mL/hr at 05/21/23 1508   sodium chloride Stopped (05/22/23 0324)   heparin 1,350 Units/hr (05/21/23 2217)   piperacillin-tazobactam 3.375 g (05/22/23 0523)    Assessment: 66 yoM admtted on 6/11 with severe generalized abdominal pain. CT shows acute cholecystitis and possible choledocholithiasis. PMH of Afib on chronic Eliquis, hypothyroidism, GERD, chronic anxiety/depression, OSA on CPAP.  Pharmacy is consulted to dose heparin while Eliquis is on hold for possible procedures.    PTA Eliquis 5mg  PO BID.  Last dose reportedly on 05/19/23 AM.    Today, 05/22/2023: Heparin level is 0.35 and aPTT is 72, These labs are now correlating so will monitor with only HL from this point on.  CBC: Hgb and Plt stable/WNL No line or bleeding issues per RN   Goal of Therapy:  Heparin level 0.3-0.7 units/ml aPTT 66-102 seconds Monitor platelets by anticoagulation protocol: Yes   Plan:  Continue heparin IV infusion at 1350  units/hr Daily CBC and heparin level;  6/13 ERCP & possible cholecystectomy planned for 1500; GI notes recommend stopping heparin 4 hrs prior to ERCP, to be held at 1100  Adalberto Cole, PharmD, BCPS 05/22/2023 8:53 AM

## 2023-05-22 NOTE — Progress Notes (Signed)
   05/22/23 2304  BiPAP/CPAP/SIPAP  BiPAP/CPAP/SIPAP Pt Type Adult  BiPAP/CPAP/SIPAP DREAMSTATIOND  Mask Type Nasal mask  Set Rate 0 breaths/min  Respiratory Rate 18 breaths/min  Patient Home Equipment No  Auto Titrate Yes

## 2023-05-22 NOTE — Progress Notes (Signed)
ANTICOAGULATION CONSULT NOTE - Follow Up Consult  Pharmacy Consult for Heparin Indication: atrial fibrillation  No Known Allergies  Patient Measurements: Height: 5\' 8"  (172.7 cm) Weight: 93.2 kg (205 lb 8 oz) IBW/kg (Calculated) : 68.4 Heparin Dosing Weight:    Vital Signs: Temp: 98.5 F (36.9 C) (06/13 1536) Temp Source: Oral (06/13 1536) BP: 103/63 (06/13 1536) Pulse Rate: 81 (06/13 1536)  Labs: Recent Labs    05/20/23 1759 05/20/23 1759 05/21/23 0642 05/21/23 1242 05/21/23 1826 05/22/23 0520 05/22/23 0702  HGB 15.2  --  15.5  --   --  13.7  --   HCT 45.1  --  47.1  --   --  40.8  --   PLT 262  --  247  --   --  229  --   APTT  --   --  36 71*  --  72*  --   HEPARINUNFRC  --    < > 0.13* 0.41 0.46 0.35  --   CREATININE 0.80  --  0.82  --   --   --  0.96   < > = values in this interval not displayed.    Estimated Creatinine Clearance: 83.8 mL/min (by C-G formula based on SCr of 0.96 mg/dL).  Assessment: AC/Heme: PTA apixaban for afib on hold until seen by general surgery in case procedure required > IV heparin on admission  - Hep level 0.35 in goal, CBC WNL - 6/13:  ERCP. May resume IV heparin 4 hrs after per Dr. Elnoria Howard.  Goal of Therapy:  Heparin level 0.3-0.7 units/ml Monitor platelets by anticoagulation protocol: Yes   Plan:  At 1900, resume IV heparin at 1350 units/hr Daily HL and CBC   Azariah Bonura S. Merilynn Finland, PharmD, BCPS Clinical Staff Pharmacist [email or web address] Misty Stanley Stillinger 05/22/2023,3:43 PM

## 2023-05-22 NOTE — Op Note (Signed)
[facility] Patient Name: Roberto Sanford Procedure Date: 05/22/2023 MRN: [id number] Attending MD: Jeani Hawking , MD, [id number] Date of Birth: 1956-03-09 CSN: [id number] Age: 67 Admit Type: Inpatient Procedure:                ERCP Indications:              Common bile duct stone(s) Providers:                Jeani Hawking, MD, Margaree Mackintosh, RN,                            Kandice Robinsons, Technician Referring MD:              Medicines:                General Anesthesia Complications:            No immediate complications. Estimated Blood Loss:     Estimated blood loss: none. Procedure:                Pre-Anesthesia Assessment:                           - Prior to the procedure, a History and Physical                            was performed, and patient medications and                            allergies were reviewed. The patient's tolerance of                            previous anesthesia was also reviewed. The risks                            and benefits of the procedure and the sedation                            options and risks were discussed with the patient.                            All questions were answered, and informed consent                            was obtained. Prior Anticoagulants: The patient has                            taken heparin, last dose was day of procedure. ASA                            Grade Assessment: II - A patient with mild systemic                            disease. After reviewing the risks and benefits,  the patient was deemed in satisfactory condition to                            undergo the procedure.                           - Sedation was administered by an anesthesia                            professional. Deep sedation was attained.                           After obtaining informed consent, the scope was                            passed under direct vision. Throughout the                             procedure, the patient's blood pressure, pulse, and                            oxygen saturations were monitored continuously. The                            [id number]-[id number] ([id number] Olympus duodenoscope was                            introduced through the mouth, and used to inject                            contrast into and used to inject contrast into the                            bile duct and dorsal pancreatic duct. The ERCP was                            accomplished without difficulty. The patient                            tolerated the procedure well. Scope In: Scope Out: Findings:      The major papilla was normal. The bile duct was deeply cannulated with       the short-nosed traction sphincterotome. Contrast was injected. I       personally interpreted the bile duct images. There was brisk flow of       contrast through the ducts. Image quality was adequate. Contrast       extended to the hepatic ducts. The main bile duct was moderately dilated       and diffusely dilated, with a stone causing an obstruction. The largest       diameter was 9 mm. A short 0.035 inch Soft Jagwire was passed into the       biliary tree. A 10 mm biliary sphincterotomy was made with a traction       (standard) sphincterotome using ERBE electrocautery. There was no  post-sphincterotomy bleeding. The biliary tree was swept with a 10 mm       balloon starting at the bifurcation. Sludge was swept from the duct.      At the major papilla spontaneous and copious drainage of bile was       identified. Cannulation was difficult at first. The PD was cannulated       once and the wire was withdrawn. Repositioning allowed for cannulation       of the CBD and the guidewire was secured in the right hepatic ducts.       Contrast injection revealed a CBD measuring approximately 9 mm. There       was no evidence of any obvious stones in the CBD. Contrast readily       filled the gallbladder  and stones were identified. A 1 cm sphincterotomy       was created and the CBD was swept three times. Some mild sludge was       extracted initially, but no stones were removed. It is likely that the       stones on the CT scan spontaneously passed. The final occlusion       cholangiogram was negative for any retained stones. At this time the       procedure was concluded. Impression:               - The major papilla appeared normal.                           - The entire main bile duct was moderately dilated,                            with a stone causing an obstruction.                           - A biliary sphincterotomy was performed.                           - The biliary tree was swept and sludge was found. Moderate Sedation:      Not Applicable - Patient had care per Anesthesia. Recommendation:           - Return patient to hospital ward for ongoing care.                           - Resume regular diet.                           - NPO after midnight.                           Lap chole per Surgery. Procedure Code(s):        --- Professional ---                           [phone], Endoscopic retrograde                            cholangiopancreatography (ERCP); with removal of  calculi/debris from biliary/pancreatic duct(s)                           [phone], Endoscopic retrograde                            cholangiopancreatography (ERCP); with                            sphincterotomy/papillotomy                           [phone], Endoscopic catheterization of the biliary                            ductal system, radiological supervision and                            interpretation Diagnosis Code(s):        --- Professional ---                           K80.51, Calculus of bile duct without cholangitis                            or cholecystitis with obstruction CPT copyright 2022 [employer]. All rights reserved. The codes documented in  this report are preliminary and upon coder review may  be revised to meet current compliance requirements. Jeani Hawking, MD Jeani Hawking, MD 05/22/2023 2:50:07 PM This report has been signed electronically. Number of Addenda: 0

## 2023-05-22 NOTE — Progress Notes (Signed)
Subjective: Getting cleaned up in the bathroom.  Feels about the same.  Ready for ERCP today  ROS: See above, otherwise other systems negative  Objective: Vital signs in last 24 hours: Temp:  [98.1 F (36.7 C)-98.4 F (36.9 C)] 98.4 F (36.9 C) (06/13 0433) Pulse Rate:  [85-100] 94 (06/13 0433) Resp:  [18-22] 18 (06/13 0433) BP: (99-157)/(64-102) 99/64 (06/13 0433) SpO2:  [95 %-100 %] 95 % (06/13 0433) Last BM Date : 05/19/23  Intake/Output from previous day: 06/12 0701 - 06/13 0700 In: 2056 [P.O.:760; I.V.:1127.2; IV Piggyback:168.8] Out: 1525 [Urine:1525] Intake/Output this shift: Total I/O In: 0  Out: 250 [Urine:250]  PE: Gen: NAD Lungs: respiratory effort nonlabored Skin: jaundice  Lab Results:  Recent Labs    05/21/23 0642 05/22/23 0520  WBC 9.2 7.1  HGB 15.5 13.7  HCT 47.1 40.8  PLT 247 229   BMET Recent Labs    05/21/23 0642 05/22/23 0702  NA 132* 134*  K 4.3 4.0  CL 97* 101  CO2 26 25  GLUCOSE 105* 99  BUN 10 15  CREATININE 0.82 0.96  CALCIUM 8.8* 8.4*   PT/INR No results for input(s): "LABPROT", "INR" in the last 72 hours. CMP     Component Value Date/Time   NA 134 (L) 05/22/2023 0702   K 4.0 05/22/2023 0702   CL 101 05/22/2023 0702   CO2 25 05/22/2023 0702   GLUCOSE 99 05/22/2023 0702   BUN 15 05/22/2023 0702   CREATININE 0.96 05/22/2023 0702   CALCIUM 8.4 (L) 05/22/2023 0702   PROT 6.5 05/22/2023 0702   ALBUMIN 3.1 (L) 05/22/2023 0702   AST 104 (H) 05/22/2023 0702   ALT 138 (H) 05/22/2023 0702   ALKPHOS 274 (H) 05/22/2023 0702   BILITOT 8.1 (H) 05/22/2023 0702   GFRNONAA >60 05/22/2023 0702   Lipase     Component Value Date/Time   LIPASE 32 05/20/2023 1759       Studies/Results: MR ABDOMEN WITH MRCP W CONTRAST  Result Date: 05/21/2023 CLINICAL DATA:  Right upper quadrant abdominal pain. Abnormal CT and ultrasound examinations. EXAM: MRI ABDOMEN WITHOUT AND WITH CONTRAST (INCLUDING MRCP) TECHNIQUE: Multiplanar  multisequence MR imaging of the abdomen was performed both before and after the administration of intravenous contrast. Heavily T2-weighted images of the biliary and pancreatic ducts were obtained, and three-dimensional MRCP images were rendered by post processing. CONTRAST:  10mL GADAVIST GADOBUTROL 1 MMOL/ML IV SOLN COMPARISON:  CT scan 05/20/2023 and ultrasound 05/21/2023. FINDINGS: Lower chest: The lung bases are clear of an acute process. No pulmonary lesions or pleural effusions. No pericardial effusion. Hepatobiliary: No hepatic lesions or intrahepatic biliary dilatation. The gallbladder is distended, demonstrates numerous small dependently layering gallstones, has marked wall thickening and enhancement and pericholecystic fluid consistent with acute cholecystitis. Normal caliber and course of the common bile duct. Small nonobstructing distal common bile duct stones. Cystic duct stones are also suspected. Pancreas: No mass, inflammation or ductal dilatation. Small amount of fluid surrounding the pancreas and in the right anterior pararenal space likely related to the gallbladder pathology. No findings suspicious for acute pancreatitis but recommend correlation with lipase and amylase levels. Spleen:  Normal size.  No focal lesions. Adrenals/Urinary Tract: Normal adrenal glands. Both kidneys are normal. Stomach/Bowel: The stomach, duodenum, small bowel and colon are unremarkable. Vascular/Lymphatic: The aorta and branch vessels are patent. The major venous structures are patent. No mesenteric or retroperitoneal mass or adenopathy. Other: Right upper quadrant fluid and inflammation without  overt ascites. Musculoskeletal: No significant bony findings. IMPRESSION: 1. MR findings consistent with acute cholecystitis. 2. Small nonobstructing distal common bile duct stones. Cystic duct stones are also suspected. 3. Small amount of fluid surrounding the pancreas and in the right anterior pararenal space likely related  to the gallbladder pathology. No findings suspicious for acute pancreatitis but recommend correlation with lipase and amylase levels. Electronically Signed   By: Rudie Meyer M.D.   On: 05/21/2023 10:04   MR 3D Recon At Scanner  Result Date: 05/21/2023 CLINICAL DATA:  Right upper quadrant abdominal pain. Abnormal CT and ultrasound examinations. EXAM: MRI ABDOMEN WITHOUT AND WITH CONTRAST (INCLUDING MRCP) TECHNIQUE: Multiplanar multisequence MR imaging of the abdomen was performed both before and after the administration of intravenous contrast. Heavily T2-weighted images of the biliary and pancreatic ducts were obtained, and three-dimensional MRCP images were rendered by post processing. CONTRAST:  10mL GADAVIST GADOBUTROL 1 MMOL/ML IV SOLN COMPARISON:  CT scan 05/20/2023 and ultrasound 05/21/2023. FINDINGS: Lower chest: The lung bases are clear of an acute process. No pulmonary lesions or pleural effusions. No pericardial effusion. Hepatobiliary: No hepatic lesions or intrahepatic biliary dilatation. The gallbladder is distended, demonstrates numerous small dependently layering gallstones, has marked wall thickening and enhancement and pericholecystic fluid consistent with acute cholecystitis. Normal caliber and course of the common bile duct. Small nonobstructing distal common bile duct stones. Cystic duct stones are also suspected. Pancreas: No mass, inflammation or ductal dilatation. Small amount of fluid surrounding the pancreas and in the right anterior pararenal space likely related to the gallbladder pathology. No findings suspicious for acute pancreatitis but recommend correlation with lipase and amylase levels. Spleen:  Normal size.  No focal lesions. Adrenals/Urinary Tract: Normal adrenal glands. Both kidneys are normal. Stomach/Bowel: The stomach, duodenum, small bowel and colon are unremarkable. Vascular/Lymphatic: The aorta and branch vessels are patent. The major venous structures are patent. No  mesenteric or retroperitoneal mass or adenopathy. Other: Right upper quadrant fluid and inflammation without overt ascites. Musculoskeletal: No significant bony findings. IMPRESSION: 1. MR findings consistent with acute cholecystitis. 2. Small nonobstructing distal common bile duct stones. Cystic duct stones are also suspected. 3. Small amount of fluid surrounding the pancreas and in the right anterior pararenal space likely related to the gallbladder pathology. No findings suspicious for acute pancreatitis but recommend correlation with lipase and amylase levels. Electronically Signed   By: Rudie Meyer M.D.   On: 05/21/2023 10:04   US Abdomen Limited RUQ (LIVER/GB)  Result Date: 05/21/2023 CLINICAL DATA:  67 year old male with choledocholithiasis on CT, abnormal gallbladder. EXAM: ULTRASOUND ABDOMEN LIMITED RIGHT UPPER QUADRANT COMPARISON:  CT Abdomen and Pelvis yesterday. FINDINGS: Gallbladder: Abnormal gallbladder wall thickening up to 7 mm. Similar mildly distended appearance of the gallbladder. Echogenic sludge and some shadowing stones within the gallbladder lumen. Stones measure up to 10 mm diameter. No sonographic Murphy sign elicited. Common bile duct: Diameter: 9-10 mm, abnormally dilated. No discrete stone identified within the duct on these images. But questionable sludge within the duct (image 37). Liver: Mild intrahepatic biliary ductal dilatation appears stable. No discrete liver lesion. Background echogenicity within normal limits. Portal vein is patent on color Doppler imaging with normal direction of blood flow towards the liver. Other: Trace perihepatic free fluid (image 61). Negative visible right kidney. IMPRESSION: 1. Ongoing dilated CBD, gallbladder distension and abnormal wall thickening, and gallstones and sludge. Choledocholithiasis less well visualized by ultrasound when compared to the CT yesterday. But appearance otherwise not significantly changed. 2. Trace perihepatic  free fluid  now. Electronically Signed   By: Odessa Fleming M.D.   On: 05/21/2023 08:03   CT ABDOMEN PELVIS W CONTRAST  Result Date: 05/20/2023 CLINICAL DATA:  Abdominal pain, acute, nonlocalized EXAM: CT ABDOMEN AND PELVIS WITH CONTRAST TECHNIQUE: Multidetector CT imaging of the abdomen and pelvis was performed using the standard protocol following bolus administration of intravenous contrast. RADIATION DOSE REDUCTION: This exam was performed according to the departmental dose-optimization program which includes automated exposure control, adjustment of the mA and/or kV according to patient size and/or use of iterative reconstruction technique. CONTRAST:  OMNIPAQUE IOHEXOL 300 MG/ML  SOLN COMPARISON:  None Available. FINDINGS: Lower chest: No acute abnormality Hepatobiliary: Gallbladder is distended. Layering small stones within the gallbladder. There is gallbladder wall thickening and pericholecystic fluid. There is concern for possible small stones within the common bile duct. Mild intrahepatic biliary ductal dilatation. No focal hepatic abnormality. Pancreas: No focal abnormality or ductal dilatation. Spleen: No focal abnormality.  Normal size. Adrenals/Urinary Tract: No adrenal abnormality. No focal renal abnormality. No stones or hydronephrosis. Urinary bladder is unremarkable. Stomach/Bowel: Stomach, Sigmoid diverticulosis. No active diverticulitis. Stomach and small bowel decompressed, unremarkable. Vascular/Lymphatic: Aortic atherosclerosis. No evidence of aneurysm or adenopathy. Reproductive: Prostate enlargement. Other: No free fluid or free air. Musculoskeletal: No acute bony abnormality. IMPRESSION: Gallbladder distension with gallbladder wall thickening, pericholecystic fluid and layering stones. Findings concerning for acute cholecystitis. There appears to be high density material within the common bile duct concerning for ductal stones. This could be further evaluated with right upper quadrant ultrasound if  felt clinically indicated. Aortic atherosclerosis. Prostate enlargement. Electronically Signed   By: Charlett Nose M.D.   On: 05/20/2023 19:53    Anti-infectives: Anti-infectives (From admission, onward)    Start     Dose/Rate Route Frequency Ordered Stop   05/21/23 0600  piperacillin-tazobactam (ZOSYN) IVPB 3.375 g        3.375 g 12.5 mL/hr over 240 Minutes Intravenous Every 8 hours 05/21/23 0112     05/20/23 2015  piperacillin-tazobactam (ZOSYN) IVPB 3.375 g        3.375 g 100 mL/hr over 30 Minutes Intravenous  Once 05/20/23 2013 05/20/23 2118        Assessment/Plan Acute calculus cholecystitis, Choledocholithiasis -ERCP today by Dr. Elnoria Howard -repeat cmet in am -if no complications from ERCP, tentatively plan for lap chole tomorrow -will need to be NPO p MN -will need to hold his heparin in the early am     FEN: N.p.o. p MN, IVF per primary ID: Zosyn VTE: heparin gtt, eliquis on hold   Per primary A-fib on Eliquis (last dose Monday am) OSA GERD Anxiety Arthritis Hypothyroidism  HTN  I reviewed Consultant GI notes, hospitalist notes, last 24 h vitals and pain scores, last 48 h intake and output, last 24 h labs and trends, and last 24 h imaging results.   LOS: 1 day    Letha Cape , [facility] 05/22/2023, 10:55 AM Please see Amion for pager number during day hours 7:00am-4:30pm or 7:00am -11:30am on weekends

## 2023-05-23 ENCOUNTER — Inpatient Hospital Stay (HOSPITAL_COMMUNITY): Payer: PPO | Admitting: Anesthesiology

## 2023-05-23 ENCOUNTER — Other Ambulatory Visit: Payer: Self-pay

## 2023-05-23 ENCOUNTER — Encounter (HOSPITAL_COMMUNITY)
Admission: EM | Disposition: A | Discharge status: Home / Self Care | Payer: Self-pay | Source: Home / Self Care | Attending: Internal Medicine

## 2023-05-23 ENCOUNTER — Encounter (HOSPITAL_COMMUNITY): Payer: Self-pay | Admitting: Internal Medicine

## 2023-05-23 DIAGNOSIS — F419 Anxiety disorder, unspecified: Secondary | ICD-10-CM

## 2023-05-23 DIAGNOSIS — K8000 Calculus of gallbladder with acute cholecystitis without obstruction: Secondary | ICD-10-CM | POA: Diagnosis not present

## 2023-05-23 DIAGNOSIS — E039 Hypothyroidism, unspecified: Secondary | ICD-10-CM

## 2023-05-23 DIAGNOSIS — G4730 Sleep apnea, unspecified: Secondary | ICD-10-CM

## 2023-05-23 HISTORY — PX: CHOLECYSTECTOMY: SHX55

## 2023-05-23 LAB — GLUCOSE, CAPILLARY
Glucose-Capillary: 127 mg/dL — ABNORMAL HIGH (ref 70–99)
Glucose-Capillary: 136 mg/dL — ABNORMAL HIGH (ref 70–99)
Glucose-Capillary: 141 mg/dL — ABNORMAL HIGH (ref 70–99)
Glucose-Capillary: 145 mg/dL — ABNORMAL HIGH (ref 70–99)
Glucose-Capillary: 149 mg/dL — ABNORMAL HIGH (ref 70–99)
Glucose-Capillary: 168 mg/dL — ABNORMAL HIGH (ref 70–99)
Glucose-Capillary: 184 mg/dL — ABNORMAL HIGH (ref 70–99)

## 2023-05-23 LAB — COMPREHENSIVE METABOLIC PANEL
ALT: 102 U/L — ABNORMAL HIGH (ref 0–44)
AST: 56 U/L — ABNORMAL HIGH (ref 15–41)
Albumin: 3.1 g/dL — ABNORMAL LOW (ref 3.5–5.0)
Alkaline Phosphatase: 283 U/L — ABNORMAL HIGH (ref 38–126)
Anion gap: 10 (ref 5–15)
BUN: 22 mg/dL (ref 8–23)
CO2: 23 mmol/L (ref 22–32)
Calcium: 8.6 mg/dL — ABNORMAL LOW (ref 8.9–10.3)
Chloride: 101 mmol/L (ref 98–111)
Creatinine, Ser: 0.98 mg/dL (ref 0.61–1.24)
GFR, Estimated: >60 mL/min (ref >60)
Glucose, Bld: 147 mg/dL — ABNORMAL HIGH (ref 70–99)
Potassium: 4 mmol/L (ref 3.5–5.1)
Sodium: 134 mmol/L — ABNORMAL LOW (ref 135–145)
Total Bilirubin: 2.8 mg/dL — ABNORMAL HIGH (ref 0.3–1.2)
Total Protein: 6.6 g/dL (ref 6.5–8.1)

## 2023-05-23 LAB — MRSA NEXT GEN BY PCR, NASAL: MRSA by PCR Next Gen: NOT DETECTED

## 2023-05-23 LAB — CBC
HCT: 42.9 % (ref 39.0–52.0)
Hemoglobin: 13.9 g/dL (ref 13.0–17.0)
MCH: 30.2 pg (ref 26.0–34.0)
MCHC: 32.4 g/dL (ref 30.0–36.0)
MCV: 93.1 fL (ref 80.0–100.0)
Platelets: 215 10*3/uL (ref 150–400)
RBC: 4.61 MIL/uL (ref 4.22–5.81)
RDW: 13.6 % (ref 11.5–15.5)
WBC: 5.3 10*3/uL (ref 4.0–10.5)
nRBC: 0 % (ref 0.0–0.2)

## 2023-05-23 LAB — TSH: TSH: 2.501 u[IU]/mL (ref 0.350–4.500)

## 2023-05-23 LAB — CULTURE, BLOOD (ROUTINE X 2)

## 2023-05-23 SURGERY — LAPAROSCOPIC CHOLECYSTECTOMY
Anesthesia: General

## 2023-05-23 MED ORDER — LACTATED RINGERS IV SOLN: INTRAVENOUS | Status: DC | PRN

## 2023-05-23 MED ORDER — ONDANSETRON HCL 4 MG/2ML IJ SOLN: 4.0000 mg | Freq: Four times a day (QID) | INTRAMUSCULAR | Status: DC | PRN

## 2023-05-23 MED ORDER — ROCURONIUM BROMIDE 10 MG/ML (PF) SYRINGE
PREFILLED_SYRINGE | INTRAVENOUS | Status: AC
  Filled 2023-05-23: qty 10

## 2023-05-23 MED ORDER — OXYCODONE HCL 5 MG PO TABS
5.0000 mg | ORAL_TABLET | ORAL | Status: DC | PRN
  Administered 2023-05-23 – 2023-05-24 (×2): 5 mg via ORAL
  Administered 2023-05-25: 10 mg via ORAL
  Administered 2023-05-25: 5 mg via ORAL
  Administered 2023-05-26: 10 mg via ORAL
  Filled 2023-05-23 (×3): qty 1
  Filled 2023-05-23 (×2): qty 2

## 2023-05-23 MED ORDER — ACETAMINOPHEN 650 MG RE SUPP: 650.0000 mg | Freq: Four times a day (QID) | RECTAL | Status: DC | PRN

## 2023-05-23 MED ORDER — DILTIAZEM LOAD VIA INFUSION
10.0000 mg | Freq: Once | INTRAVENOUS | Status: AC
  Administered 2023-05-23 (×2): 5 mg via INTRAVENOUS
  Filled 2023-05-23: qty 10

## 2023-05-23 MED ORDER — CHLORHEXIDINE GLUCONATE CLOTH 2 % EX PADS
6.0000 | MEDICATED_PAD | Freq: Every day | CUTANEOUS | Status: DC
  Administered 2023-05-23 – 2023-05-26 (×4): 6 via TOPICAL

## 2023-05-23 MED ORDER — FENTANYL CITRATE PF 50 MCG/ML IJ SOSY: 25.0000 ug | PREFILLED_SYRINGE | INTRAMUSCULAR | Status: DC | PRN

## 2023-05-23 MED ORDER — LACTATED RINGERS IR SOLN
Status: DC | PRN
  Administered 2023-05-23: 1000 mL

## 2023-05-23 MED ORDER — ACETAMINOPHEN 160 MG/5ML PO SOLN: 1000.0000 mg | Freq: Once | ORAL | Status: DC | PRN

## 2023-05-23 MED ORDER — ONDANSETRON HCL 4 MG/2ML IJ SOLN
INTRAMUSCULAR | Status: DC | PRN
  Administered 2023-05-23: 4 mg via INTRAVENOUS

## 2023-05-23 MED ORDER — SODIUM CHLORIDE 0.45 % IV SOLN: INTRAVENOUS | Status: DC

## 2023-05-23 MED ORDER — ACETAMINOPHEN 325 MG PO TABS: 650.0000 mg | ORAL_TABLET | Freq: Four times a day (QID) | ORAL | Status: DC | PRN

## 2023-05-23 MED ORDER — PROPOFOL 10 MG/ML IV BOLUS
INTRAVENOUS | Status: DC | PRN
  Administered 2023-05-23: 60 mg via INTRAVENOUS
  Administered 2023-05-23: 120 mg via INTRAVENOUS

## 2023-05-23 MED ORDER — LABETALOL HCL 5 MG/ML IV SOLN
INTRAVENOUS | Status: AC
  Filled 2023-05-23: qty 4

## 2023-05-23 MED ORDER — INSULIN ASPART 100 UNIT/ML IJ SOLN
0.0000 [IU] | Freq: Three times a day (TID) | INTRAMUSCULAR | Status: DC
  Administered 2023-05-24 (×2): 1 [IU] via SUBCUTANEOUS

## 2023-05-23 MED ORDER — OXYCODONE HCL 5 MG PO TABS
5.0000 mg | ORAL_TABLET | Freq: Once | ORAL | Status: DC | PRN
Start: 2023-05-23 — End: 2023-05-23

## 2023-05-23 MED ORDER — FENTANYL CITRATE PF 50 MCG/ML IJ SOSY
PREFILLED_SYRINGE | INTRAMUSCULAR | Status: AC
  Filled 2023-05-23: qty 1

## 2023-05-23 MED ORDER — FENTANYL CITRATE (PF) 250 MCG/5ML IJ SOLN
INTRAMUSCULAR | Status: AC
  Filled 2023-05-23: qty 5

## 2023-05-23 MED ORDER — TRAMADOL HCL 50 MG PO TABS
50.0000 mg | ORAL_TABLET | Freq: Four times a day (QID) | ORAL | Status: DC | PRN
  Administered 2023-05-24: 50 mg via ORAL
  Filled 2023-05-23: qty 1

## 2023-05-23 MED ORDER — OXYCODONE HCL 5 MG PO TABS: 5.0000 mg | ORAL_TABLET | Freq: Once | ORAL | Status: DC | PRN

## 2023-05-23 MED ORDER — METOPROLOL TARTRATE 5 MG/5ML IV SOLN
INTRAVENOUS | Status: DC | PRN
  Administered 2023-05-23 (×2): 1 mg via INTRAVENOUS

## 2023-05-23 MED ORDER — DEXAMETHASONE SODIUM PHOSPHATE 10 MG/ML IJ SOLN
INTRAMUSCULAR | Status: AC
  Filled 2023-05-23: qty 1

## 2023-05-23 MED ORDER — FENTANYL CITRATE (PF) 250 MCG/5ML IJ SOLN
INTRAMUSCULAR | Status: DC | PRN
  Administered 2023-05-23: 100 ug via INTRAVENOUS
  Administered 2023-05-23 (×3): 50 ug via INTRAVENOUS

## 2023-05-23 MED ORDER — LIDOCAINE HCL (PF) 2 % IJ SOLN
INTRAMUSCULAR | Status: AC
  Filled 2023-05-23: qty 5

## 2023-05-23 MED ORDER — PROPOFOL 10 MG/ML IV BOLUS
INTRAVENOUS | Status: AC
  Filled 2023-05-23: qty 20

## 2023-05-23 MED ORDER — HEPARIN (PORCINE) 25000 UT/250ML-% IV SOLN
1350.0000 [IU]/h | INTRAVENOUS | Status: DC
  Administered 2023-05-23: 1350 [IU]/h via INTRAVENOUS
  Filled 2023-05-23: qty 250

## 2023-05-23 MED ORDER — ACETAMINOPHEN 500 MG PO TABS
1000.0000 mg | ORAL_TABLET | Freq: Once | ORAL | Status: DC | PRN
Start: 2023-05-23 — End: 2023-05-23

## 2023-05-23 MED ORDER — OXYCODONE HCL 5 MG/5ML PO SOLN: 5.0000 mg | Freq: Once | ORAL | Status: DC | PRN

## 2023-05-23 MED ORDER — MIDAZOLAM HCL 2 MG/2ML IJ SOLN
INTRAMUSCULAR | Status: DC | PRN
  Administered 2023-05-23: 2 mg via INTRAVENOUS

## 2023-05-23 MED ORDER — DEXAMETHASONE SODIUM PHOSPHATE 10 MG/ML IJ SOLN
INTRAMUSCULAR | Status: DC | PRN
  Administered 2023-05-23: 10 mg via INTRAVENOUS

## 2023-05-23 MED ORDER — 0.9 % SODIUM CHLORIDE (POUR BTL) OPTIME
TOPICAL | Status: DC | PRN
  Administered 2023-05-23: 1000 mL

## 2023-05-23 MED ORDER — BUPIVACAINE HCL (PF) 0.5 % IJ SOLN
INTRAMUSCULAR | Status: DC | PRN
  Administered 2023-05-23: 30 mL

## 2023-05-23 MED ORDER — ACETAMINOPHEN 10 MG/ML IV SOLN
1000.0000 mg | Freq: Once | INTRAVENOUS | Status: DC | PRN
  Administered 2023-05-23: 1000 mg via INTRAVENOUS

## 2023-05-23 MED ORDER — HYDROMORPHONE HCL 1 MG/ML IJ SOLN
1.0000 mg | INTRAMUSCULAR | Status: DC | PRN
  Administered 2023-05-23 (×3): 1 mg via INTRAVENOUS
  Filled 2023-05-23 (×3): qty 1

## 2023-05-23 MED ORDER — FENTANYL CITRATE PF 50 MCG/ML IJ SOSY
25.0000 ug | PREFILLED_SYRINGE | INTRAMUSCULAR | Status: DC | PRN
  Administered 2023-05-23: 50 ug via INTRAVENOUS

## 2023-05-23 MED ORDER — METOPROLOL TARTRATE 5 MG/5ML IV SOLN
INTRAVENOUS | Status: AC
  Filled 2023-05-23: qty 5

## 2023-05-23 MED ORDER — ACETAMINOPHEN 10 MG/ML IV SOLN
INTRAVENOUS | Status: AC
  Filled 2023-05-23: qty 100

## 2023-05-23 MED ORDER — SUGAMMADEX SODIUM 200 MG/2ML IV SOLN
INTRAVENOUS | Status: DC | PRN
  Administered 2023-05-23: 200 mg via INTRAVENOUS

## 2023-05-23 MED ORDER — ROCURONIUM BROMIDE 10 MG/ML (PF) SYRINGE
PREFILLED_SYRINGE | INTRAVENOUS | Status: DC | PRN
  Administered 2023-05-23: 5 mg via INTRAVENOUS
  Administered 2023-05-23: 50 mg via INTRAVENOUS

## 2023-05-23 MED ORDER — ONDANSETRON 4 MG PO TBDP: 4.0000 mg | ORAL_TABLET | Freq: Four times a day (QID) | ORAL | Status: DC | PRN

## 2023-05-23 MED ORDER — BUPIVACAINE HCL (PF) 0.5 % IJ SOLN
INTRAMUSCULAR | Status: AC
  Filled 2023-05-23: qty 30

## 2023-05-23 MED ORDER — HEMOSTATIC AGENTS (NO CHARGE) OPTIME
TOPICAL | Status: DC | PRN
  Administered 2023-05-23: 1 via TOPICAL

## 2023-05-23 MED ORDER — LIDOCAINE 2% (20 MG/ML) 5 ML SYRINGE
INTRAMUSCULAR | Status: DC | PRN
  Administered 2023-05-23: 100 mg via INTRAVENOUS

## 2023-05-23 MED ORDER — ACETAMINOPHEN 500 MG PO TABS: 1000.0000 mg | ORAL_TABLET | Freq: Once | ORAL | Status: DC | PRN

## 2023-05-23 MED ORDER — ONDANSETRON HCL 4 MG/2ML IJ SOLN
INTRAMUSCULAR | Status: AC
  Filled 2023-05-23: qty 2

## 2023-05-23 MED ORDER — DILTIAZEM HCL-DEXTROSE 125-5 MG/125ML-% IV SOLN (PREMIX)
5.0000 mg/h | INTRAVENOUS | Status: DC
  Administered 2023-05-23: 10 mg/h via INTRAVENOUS
  Filled 2023-05-23: qty 125

## 2023-05-23 MED ORDER — LABETALOL HCL 5 MG/ML IV SOLN
INTRAVENOUS | Status: DC | PRN
  Administered 2023-05-23 (×2): 5 mg via INTRAVENOUS

## 2023-05-23 MED ORDER — ACETAMINOPHEN 160 MG/5ML PO SOLN
1000.0000 mg | Freq: Once | ORAL | Status: DC | PRN
Start: 2023-05-23 — End: 2023-05-23

## 2023-05-23 MED ORDER — SODIUM CHLORIDE 0.9 % IV SOLN: INTRAVENOUS | Status: DC | PRN

## 2023-05-23 MED ORDER — ACETAMINOPHEN 10 MG/ML IV SOLN: 1000.0000 mg | Freq: Once | INTRAVENOUS | Status: DC | PRN

## 2023-05-23 SURGICAL SUPPLY — 44 items
APL PRP STRL LF DISP 70% ISPRP (MISCELLANEOUS) ×1
APPLIER CLIP ROT 10 11.4 M/L (STAPLE) ×1
APR CLP MED LRG 11.4X10 (STAPLE) ×1
BAG COUNTER SPONGE SURGICOUNT (BAG) IMPLANT
BAG SPNG CNTER NS LX DISP (BAG)
CABLE HIGH FREQUENCY MONO STRZ (ELECTRODE) ×1 IMPLANT
CHLORAPREP W/TINT 26 (MISCELLANEOUS) ×1 IMPLANT
CLIP APPLIE ROT 10 11.4 M/L (STAPLE) ×1 IMPLANT
COVER MAYO STAND STRL (DRAPES) IMPLANT
COVER SURGICAL LIGHT HANDLE (MISCELLANEOUS) ×1 IMPLANT
DRAIN CHANNEL 19F RND (DRAIN) IMPLANT
DRAPE C-ARM 42X120 X-RAY (DRAPES) IMPLANT
ELECT REM PT RETURN 15FT ADLT (MISCELLANEOUS) ×1 IMPLANT
EVACUATOR SILICONE 100CC (DRAIN) IMPLANT
GLOVE SURG LX STRL 8.0 MICRO (GLOVE) IMPLANT
GLOVE SURG ORTHO 8.0 STRL STRW (GLOVE) ×1 IMPLANT
GLOVE SURG SYN 7.5 E (GLOVE) ×1 IMPLANT
GLOVE SURG SYN 7.5 PF PI (GLOVE) ×1 IMPLANT
GOWN STRL REUS W/ TWL XL LVL3 (GOWN DISPOSABLE) ×2 IMPLANT
GOWN STRL REUS W/TWL XL LVL3 (GOWN DISPOSABLE) ×2
HEMOSTAT SNOW SURGICEL 2X4 (HEMOSTASIS) IMPLANT
HEMOSTAT SURGICEL 4X8 (HEMOSTASIS) IMPLANT
IRRIG SUCT STRYKERFLOW 2 WTIP (MISCELLANEOUS) ×1
IRRIGATION SUCT STRKRFLW 2 WTP (MISCELLANEOUS) ×1 IMPLANT
KIT BASIN OR (CUSTOM PROCEDURE TRAY) ×1 IMPLANT
KIT TURNOVER KIT A (KITS) IMPLANT
PAD POSITIONING PINK XL (MISCELLANEOUS) IMPLANT
PENCIL SMOKE EVACUATOR (MISCELLANEOUS) IMPLANT
SCISSORS LAP 5X35 DISP (ENDOMECHANICALS) ×1 IMPLANT
SET CHOLANGIOGRAPH MIX (MISCELLANEOUS) ×1 IMPLANT
SET TUBE SMOKE EVAC HIGH FLOW (TUBING) ×1 IMPLANT
SLEEVE Z-THREAD 5X100MM (TROCAR) ×1 IMPLANT
SPIKE FLUID TRANSFER (MISCELLANEOUS) ×1 IMPLANT
STRIP CLOSURE SKIN 1/2X4 (GAUZE/BANDAGES/DRESSINGS) ×1 IMPLANT
SUT MNCRL AB 4-0 PS2 18 (SUTURE) IMPLANT
SUT VIC AB 4-0 PS2 27 (SUTURE) ×1 IMPLANT
SYS BAG RETRIEVAL 10MM (BASKET) ×1
SYSTEM BAG RETRIEVAL 10MM (BASKET) ×1 IMPLANT
TAPE CLOTH 4X10 WHT NS (GAUZE/BANDAGES/DRESSINGS) IMPLANT
TOWEL OR 17X26 10 PK STRL BLUE (TOWEL DISPOSABLE) ×1 IMPLANT
TRAY LAPAROSCOPIC (CUSTOM PROCEDURE TRAY) ×1 IMPLANT
TROCAR 11X100 Z THREAD (TROCAR) ×1 IMPLANT
TROCAR BALLN 12MMX100 BLUNT (TROCAR) ×1 IMPLANT
TROCAR Z-THREAD OPTICAL 5X100M (TROCAR) ×1 IMPLANT

## 2023-05-23 NOTE — Discharge Instructions (Signed)
[facility]  Please arrive at least 30 min before your appointment to complete your check in paperwork.  If you are unable to arrive 30 min prior to your appointment time we may have to cancel or reschedule you. LAPAROSCOPIC SURGERY: POST OP INSTRUCTIONS Always review your discharge instruction sheet given to you by the facility where your surgery was performed. IF YOU HAVE DISABILITY OR FAMILY LEAVE FORMS, YOU MUST BRING THEM TO THE OFFICE FOR PROCESSING.   DO NOT GIVE THEM TO YOUR DOCTOR.  PAIN CONTROL  First take acetaminophen (Tylenol) AND/or ibuprofen (Advil) to control your pain after surgery.  Follow directions on package.  Taking acetaminophen (Tylenol) and/or ibuprofen (Advil) regularly after surgery will help to control your pain and lower the amount of prescription pain medication you may need.  You should not take more than 4,000 mg (4 grams) of acetaminophen (Tylenol) in 24 hours.  You should not take ibuprofen (Advil), aleve, motrin, naprosyn or other NSAIDS if you have a history of stomach ulcers or chronic kidney disease.  A prescription for pain medication may be given to you upon discharge.  Take your pain medication as prescribed, if you still have uncontrolled pain after taking acetaminophen (Tylenol) or ibuprofen (Advil). Use ice packs to help control pain. If you need a refill on your pain medication, please contact your pharmacy.  They will contact our office to request authorization. Prescriptions will not be filled after 5pm or on week-ends.  HOME MEDICATIONS Take your usually prescribed medications unless otherwise directed.  DIET You should follow a light diet the first few days after arrival home.  Be sure to include lots of fluids daily. Avoid fatty, fried foods.   CONSTIPATION It is common to experience some constipation after surgery and if you are taking pain medication.  Increasing fluid intake and taking a stool softener (such as Colace)  will usually help or prevent this problem from occurring.  A mild laxative (Milk of Magnesia or Miralax) should be taken according to package instructions if there are no bowel movements after 48 hours.  WOUND/INCISION CARE Most patients will experience some swelling and bruising in the area of the incisions.  Ice packs will help.  Swelling and bruising can take several days to resolve.  Unless discharge instructions indicate otherwise, follow guidelines below  STERI-STRIPS - you may remove your outer bandages 48 hours after surgery, and you may shower at that time.  You have steri-strips (small skin tapes) in place directly over the incision.  These strips should be left on the skin for 7-10 days.   DERMABOND/SKIN GLUE - you may shower in 24 hours.  The glue will flake off over the next 2-3 weeks. Any sutures or staples will be removed at the office during your follow-up visit.  ACTIVITIES You may resume regular (light) daily activities beginning the next day--such as daily self-care, walking, climbing stairs--gradually increasing activities as tolerated.  You may have sexual intercourse when it is comfortable.  Refrain from any heavy lifting or straining until approved by your doctor. You may drive when you are no longer taking prescription pain medication, you can comfortably wear a seatbelt, and you can safely maneuver your car and apply brakes.  FOLLOW-UP You should see your doctor in the office for a follow-up appointment approximately 2-3 weeks after your surgery.  You should have been given your post-op/follow-up appointment when your surgery was scheduled.  If you did not receive a post-op/follow-up appointment, make sure   that you call for this appointment within a day or two after you arrive home to insure a convenient appointment time.   WHEN TO CALL YOUR DOCTOR: Fever over 101.0 Inability to urinate Continued bleeding from incision. Increased pain, redness, or drainage from the  incision. Increasing abdominal pain If you drain turns green or a dark color  The clinic staff is available to answer your questions during regular business hours.  Please don't hesitate to call and ask to speak to one of the nurses for clinical concerns.  If you have a medical emergency, go to the nearest emergency room or call 911.  A surgeon from [facility] is always on call at the hospital. [address], [address], [place], [place]  [address] ? [address] [address], [place], [place]   [address] [phone] ? [phone] ? FAX [phone]      Managing Your Pain After Surgery Without Opioids    Thank you for participating in our program to help patients manage their pain after surgery without opioids. This is part of our effort to provide you with the best care possible, without exposing you or your family to the risk that opioids pose.  What pain can I expect after surgery? You can expect to have some pain after surgery. This is normal. The pain is typically worse the day after surgery, and quickly begins to get better. Many studies have found that many patients are able to manage their pain after surgery with Over-the-Counter (OTC) medications such as Tylenol and Motrin. If you have a condition that does not allow you to take Tylenol or Motrin, notify your surgical team.  How will I manage my pain? The best strategy for controlling your pain after surgery is around the clock pain control with Tylenol (acetaminophen) and Motrin (ibuprofen or Advil). Alternating these medications with each other allows you to maximize your pain control. In addition to Tylenol and Motrin, you can use heating pads or ice packs on your incisions to help reduce your pain.  How will I alternate your regular strength over-the-counter pain medication? You will take a dose of pain medication every three hours. Start by taking 650 mg of Tylenol (2 pills of 325 mg) 3 hours later take 600 mg of  Motrin (3 pills of 200 mg) 3 hours after taking the Motrin take 650 mg of Tylenol 3 hours after that take 600 mg of Motrin.   - 1 -  See example - if your first dose of Tylenol is at 12:00 PM   12:00 PM Tylenol 650 mg (2 pills of 325 mg)  3:00 PM Motrin 600 mg (3 pills of 200 mg)  6:00 PM Tylenol 650 mg (2 pills of 325 mg)  9:00 PM Motrin 600 mg (3 pills of 200 mg)  Continue alternating every 3 hours   We recommend that you follow this schedule around-the-clock for at least 3 days after surgery, or until you feel that it is no longer needed. Use the table on the last page of this handout to keep track of the medications you are taking. Important: Do not take more than 3000mg  of Tylenol or 3200mg  of Motrin in a 24-hour period. Do not take ibuprofen/Motrin if you have a history of bleeding stomach ulcers, severe kidney disease, &/or actively taking a blood thinner  What if I still have pain? If you have pain that is not controlled with the over-the-counter pain medications (Tylenol and Motrin or Advil) you might have  what we call "breakthrough" pain. You will receive a prescription for a small amount of an opioid pain medication such as Oxycodone, Tramadol, or Tylenol with Codeine. Use these opioid pills in the first 24 hours after surgery if you have breakthrough pain. Do not take more than 1 pill every 4-6 hours.  If you still have uncontrolled pain after using all opioid pills, don't hesitate to call our staff using the number provided. We will help make sure you are managing your pain in the best way possible, and if necessary, we can provide a prescription for additional pain medication.   Day 1    Time  Name of Medication Number of pills taken  Amount of Acetaminophen  Pain Level   Comments  AM PM       AM PM       AM PM       AM PM       AM PM       AM PM       AM PM       AM PM       Total Daily amount of Acetaminophen Do not take more than  3,000 mg per day       Day 2    Time  Name of Medication Number of pills taken  Amount of Acetaminophen  Pain Level   Comments  AM PM       AM PM       AM PM       AM PM       AM PM       AM PM       AM PM       AM PM       Total Daily amount of Acetaminophen Do not take more than  3,000 mg per day      Day 3    Time  Name of Medication Number of pills taken  Amount of Acetaminophen  Pain Level   Comments  AM PM       AM PM       AM PM       AM PM         AM PM       AM PM       AM PM       AM PM       Total Daily amount of Acetaminophen Do not take more than  3,000 mg per day      Day 4    Time  Name of Medication Number of pills taken  Amount of Acetaminophen  Pain Level   Comments  AM PM       AM PM       AM PM       AM PM       AM PM       AM PM       AM PM       AM PM       Total Daily amount of Acetaminophen Do not take more than  3,000 mg per day      Day 5    Time  Name of Medication Number of pills taken  Amount of Acetaminophen  Pain Level   Comments  AM PM       AM PM       AM PM       AM PM  AM PM       AM PM       AM PM       AM PM       Total Daily amount of Acetaminophen Do not take more than  3,000 mg per day      Day 6    Time  Name of Medication Number of pills taken  Amount of Acetaminophen  Pain Level  Comments  AM PM       AM PM       AM PM       AM PM       AM PM       AM PM       AM PM       AM PM       Total Daily amount of Acetaminophen Do not take more than  3,000 mg per day      Day 7    Time  Name of Medication Number of pills taken  Amount of Acetaminophen  Pain Level   Comments  AM PM       AM PM       AM PM       AM PM       AM PM       AM PM       AM PM       AM PM       Total Daily amount of Acetaminophen Do not take more than  3,000 mg per day        For additional information about how and where to safely dispose of unused opioid medications -  [email or web address]  Disclaimer: This document contains information and/or instructional materials adapted from [facility] for the typical patient with your condition. It does not replace medical advice from your health care provider because your experience may differ from that of the typical patient. Talk to your health care provider if you have any questions about this document, your condition or your treatment plan. Adapted from [facility]

## 2023-05-23 NOTE — Interval H&P Note (Signed)
History and Physical Interval Note:  05/23/2023 9:06 AM  Roberto Sanford  has presented today for surgery, with the diagnosis of CHOLECYSITIS.  The various methods of treatment have been discussed with the patient and family. After consideration of risks, benefits and other options for treatment, the patient has consented to    Procedure(s): LAPAROSCOPIC CHOLECYSTECTOMY (N/A) as a surgical intervention.    The patient's history has been reviewed, patient examined, no change in status, stable for surgery.  I have reviewed the patient's chart and labs.  Questions were answered to the patient's satisfaction.    Darnell Level, MD [facility] practice Office: [phone]   Darnell Level

## 2023-05-23 NOTE — Transfer of Care (Signed)
Immediate Anesthesia Transfer of Care Note  Patient: Roberto Sanford  Procedure(s) Performed: LAPAROSCOPIC CHOLECYSTECTOMY  Patient Location: PACU  Anesthesia Type:General  Level of Consciousness: awake and alert   Airway & Oxygen Therapy: Patient Spontanous Breathing and Patient connected to face mask oxygen  Post-op Assessment: Report given to RN and Post -op Vital signs reviewed and stable  Post vital signs: Reviewed and stable  Last Vitals:  Vitals Value Taken Time  BP    Temp    Pulse 71 05/23/23 1058  Resp    SpO2 100 % 05/23/23 1058  Vitals shown include unvalidated device data.  Last Pain:  Vitals:   05/23/23 0819  TempSrc: Oral  PainSc:       Patients Stated Pain Goal: 2 (05/21/23 2309)  Complications: No notable events documented.

## 2023-05-23 NOTE — Anesthesia Postprocedure Evaluation (Signed)
Anesthesia Post Note  Patient: Roberto Sanford  Procedure(s) Performed: LAPAROSCOPIC CHOLECYSTECTOMY     Patient location during evaluation: PACU Anesthesia Type: General Level of consciousness: awake and alert and oriented Pain management: pain level controlled Vital Signs Assessment: post-procedure vital signs reviewed and stable Respiratory status: spontaneous breathing, nonlabored ventilation, respiratory function stable and patient connected to nasal cannula oxygen Cardiovascular status: blood pressure returned to baseline, stable and unstable Postop Assessment: no apparent nausea or vomiting Anesthetic complications: no Comments: Patient was in Atrial fibrillation with rate controlled until insufflation of the abdomen and went into RVR with HR in 140's to 160. He was also hypertensive at this time. He was given 2mg  of metoprolol and 10 mg labetalol IV. His HR persisted in the 130's and he was then bolused with a total of 10mg  of Cardizem IV followed by a Cardizem drip at 10mg /hr. His current HR is in the 60's and he is still in atrial fibrillation and on the Cardizem drip. He is stable for D/C to ICU.   No notable events documented.  Last Vitals:  Vitals:   05/23/23 1130 05/23/23 1140  BP: (!) 122/100 (!) 138/98  Pulse: (!) 55 60  Resp: 20 14  Temp: (!) 36.4 C   SpO2: 97% 95%    Last Pain:  Vitals:   05/23/23 1130  TempSrc:   PainSc: Asleep                 Tulip Meharg A.

## 2023-05-23 NOTE — Progress Notes (Signed)
PROGRESS NOTE    Roberto Sanford  ZOX:[id number] DOB: Jan 19, 1956 DOA: 05/20/2023 PCP: Merri Brunette, MD   Brief Narrative: 67 year old male with history of A-fib on Eliquis, hypothyroidism, obstructive sleep apnea on CPAP anxiety depression admitted with abdominal pain jaundice nausea without vomiting.  He was found to have acute cholecystitis by CT with possible choledocholithiasis.  MRCP with CBD and cystic duct stones.  Patient scheduled to have ERCP 05/22/2023.  GI and general surgery following.  Assessment & Plan:   Principal Problem:   Acute calculous cholecystitis   #1 acute cholecystitis with choledocholithiasis s/p  ERCP 05/22/2023 Dr. Sol Passer bile duct was moderately dilated with a stone causing an obstruction, biliary sphincterotomy was performed, biliary tree was swept and sludge was found.  Status post lap chole 05/23/2023 Dr. Gerrit Friends He went into rapid A-fib RVR.  Started on Cardizem and sent to stepdown unit.  In stepdown unit his heart rate dropped to the 40s with some pauses and Cardizem drip was stopped at that point.  Continue heparin drip. T bili 2.8 Continue Zosyn   #2 permanent atrial fibrillation on Eliquis at home currently on hold  #3 hypothyroidism on levothyroxine  #4 anxiety/depression on amitriptyline  #5 obstructive sleep apnea cpap   #6 hyponatremia stable monitor continue NS  #7 hyperlipidemia on Crestor  #8 hypertension  Home meds still on hold. Estimated body mass index is 31.84 kg/m as calculated from the following:   Height as of this encounter: 5\' 8"  (1.727 m).   Weight as of this encounter: 95 kg.  DVT prophylaxis: Heparin  code Status: Full code  family Communication: None at bedside  disposition Plan:  Status is: Inpatient Remains inpatient appropriate because: Acute calculus cholecystitis with CBD and and cystic duct stones   Consultants:  GI and general surgery  Procedures: MRCP For ERCP on 05/22/2023 Antimicrobials:  Zosyn  Subjective: Resting in bed  No new c/o Objective: Vitals:   05/23/23 1115 05/23/23 1130 05/23/23 1140 05/23/23 1200  BP: (!) 138/99 (!) 122/100 (!) 138/98 (!) 135/95  Pulse: 73 (!) 55 60 68  Resp: 19 20 14 19   Temp:  (!) 97.5 F (36.4 C)  (!) 97.4 F (36.3 C)  TempSrc:    Oral  SpO2: 96% 97% 95% 97%  Weight:    95 kg  Height:    5\' 8"  (1.727 m)    Intake/Output Summary (Last 24 hours) at 05/23/2023 1318 Last data filed at 05/23/2023 1225 Gross per 24 hour  Intake 3068.06 ml  Output 50 ml  Net 3018.06 ml    Filed Weights   05/21/23 0657 05/23/23 0819 05/23/23 1200  Weight: 93.2 kg 93.2 kg 95 kg    Examination:  General exam: Appears in no acute distress appears jaundiced Respiratory system: Clear to auscultation. Respiratory effort normal. Cardiovascular system: S1 & S2 heard, RRR. No JVD, murmurs, rubs, gallops or clicks. No pedal edema. Gastrointestinal system: Abdomen is nondistended, soft and nontender. No organomegaly or masses felt. Normal bowel sounds heard. Central nervous system: Alert and oriented. No focal neurological deficits. Extremities: Symmetric 5 x 5 power. Skin: No rashes, lesions or ulcers Psychiatry: Judgement and insight appear normal. Mood & affect appropriate.     Data Reviewed: I have personally reviewed following labs and imaging studies  CBC: Recent Labs  Lab 05/20/23 1759 05/21/23 0642 05/22/23 0520 05/23/23 0516  WBC 10.2 9.2 7.1 5.3  NEUTROABS 8.9*  --   --   --   HGB 15.2 15.5 13.7  13.9  HCT 45.1 47.1 40.8 42.9  MCV 90.2 90.9 90.9 93.1  PLT 262 247 229 215    Basic Metabolic Panel: Recent Labs  Lab 05/20/23 1759 05/21/23 0642 05/22/23 0702 05/23/23 0516  NA 129* 132* 134* 134*  K 4.2 4.3 4.0 4.0  CL 95* 97* 101 101  CO2 23 26 25 23   GLUCOSE 150* 105* 99 147*  BUN 11 10 15 22   CREATININE 0.80 0.82 0.96 0.98  CALCIUM 8.9 8.8* 8.4* 8.6*  MG  --  2.2  --   --   PHOS  --  3.8  --   --     GFR: Estimated  Creatinine Clearance: 82.9 mL/min (by C-G formula based on SCr of 0.98 mg/dL). Liver Function Tests: Recent Labs  Lab 05/20/23 1759 05/21/23 0642 05/22/23 0702 05/23/23 0516  AST 313* 201* 104* 56*  ALT 221* 224* 138* 102*  ALKPHOS 202* 238* 274* 283*  BILITOT 3.4* 8.0* 8.1* 2.8*  PROT 8.3* 7.4 6.5 6.6  ALBUMIN 4.2 3.7 3.1* 3.1*    Recent Labs  Lab 05/20/23 1759  LIPASE 32    No results for input(s): "AMMONIA" in the last 168 hours. Coagulation Profile: No results for input(s): "INR", "PROTIME" in the last 168 hours. Cardiac Enzymes: No results for input(s): "CKTOTAL", "CKMB", "CKMBINDEX", "TROPONINI" in the last 168 hours. BNP (last 3 results) No results for input(s): "PROBNP" in the last 8760 hours. HbA1C: Recent Labs    05/21/23 0513  HGBA1C 5.0    CBG: Recent Labs  Lab 05/22/23 2018 05/23/23 0002 05/23/23 0402 05/23/23 0728 05/23/23 1220  GLUCAP 178* 136* 141* 127* 149*    Lipid Profile: No results for input(s): "CHOL", "HDL", "LDLCALC", "TRIG", "CHOLHDL", "LDLDIRECT" in the last 72 hours. Thyroid Function Tests: No results for input(s): "TSH", "T4TOTAL", "FREET4", "T3FREE", "THYROIDAB" in the last 72 hours. Anemia Panel: No results for input(s): "VITAMINB12", "FOLATE", "FERRITIN", "TIBC", "IRON", "RETICCTPCT" in the last 72 hours. Sepsis Labs: Recent Labs  Lab 05/20/23 1745 05/20/23 2128  LATICACIDVEN 2.1* 1.6     Recent Results (from the past 240 hour(s))  Blood culture (routine x 2)     Status: None (Preliminary result)   Collection Time: 05/20/23  7:25 PM   Specimen: BLOOD  Result Value Ref Range Status   Specimen Description   Final    BLOOD LEFT ANTECUBITAL Performed at [facility], [address] [place], [place] [address]    Special Requests   Final    BOTTLES DRAWN AEROBIC AND ANAEROBIC Blood Culture adequate volume Performed at [facility], [address] [place], [place] [address]    Culture   Final     NO GROWTH 3 DAYS Performed at [facility], [address], [place], [place] [address]    Report Status PENDING  Incomplete  Blood culture (routine x 2)     Status: None (Preliminary result)   Collection Time: 05/20/23  7:30 PM   Specimen: BLOOD  Result Value Ref Range Status   Specimen Description   Final    BLOOD RIGHT ANTECUBITAL Performed at [facility] [address] [place], [place] [address]    Special Requests   Final    BOTTLES DRAWN AEROBIC AND ANAEROBIC Blood Culture adequate volume Performed at [facility], [address] [place], [place] [address]    Culture   Final    NO GROWTH 3 DAYS Performed at [facility], [address], [place], [place] [address]    Report Status PENDING  Incomplete         Radiology Studies: DG ERCP  Result Date: 05/23/2023 CLINICAL DATA:  Cholecystitis and choledocholithiasis EXAM: ERCP TECHNIQUE: Multiple spot images obtained with the fluoroscopic device and submitted for interpretation post-procedure. FLUOROSCOPY: Radiation Exposure Index (as provided by the fluoroscopic device): 17.55 mGy Kerma COMPARISON:  MRCP 05/21/2023 FINDINGS: A total of 7 intraoperative saved images are submitted for review. The images demonstrate a flexible duodenal scope in the descending duodenum with wire cannulation of the common bile duct. Subsequent cholangiography confirms patency of the cystic duct with opacification of the gallbladder. IMPRESSION: 1. ERCP as above. 2. Contrast passes through the cystic duct and opacifies the gallbladder. These images were submitted for radiologic interpretation only. Please see the procedural report for the amount of contrast and the fluoroscopy time utilized. Electronically Signed   By: Malachy Moan M.D.   On: 05/23/2023 06:48        Scheduled Meds:  amitriptyline  25 mg Oral QHS   Chlorhexidine Gluconate Cloth  6 each Topical Daily   fentaNYL       insulin aspart  0-9  Units Subcutaneous Q4H   levothyroxine  100 mcg Oral Q0600   multivitamin with minerals  1 tablet Oral Daily   rosuvastatin  10 mg Oral Daily   Continuous Infusions:  sodium chloride 50 mL/hr at 05/23/23 1225   acetaminophen     diltiazem (CARDIZEM) infusion 10 mg/hr (05/23/23 1225)   heparin     piperacillin-tazobactam 3.375 g (05/23/23 0642)     LOS: 2 days    Time spent: 39 min  Alwyn Ren, MD  05/23/2023, 1:18 PM

## 2023-05-23 NOTE — Anesthesia Preprocedure Evaluation (Signed)
Anesthesia Evaluation  Patient identified by MRN, date of birth, ID band Patient awake    Reviewed: Allergy & Precautions, NPO status , Patient's Chart, lab work & pertinent test results  History of Anesthesia Complications Negative for: history of anesthetic complications  Airway Mallampati: III  TM Distance: >3 FB Neck ROM: Full    Dental  (+) Dental Advisory Given, Teeth Intact,    Pulmonary shortness of breath, sleep apnea    breath sounds clear to auscultation       Cardiovascular negative cardio ROS  Rhythm:Regular     Neuro/Psych  PSYCHIATRIC DISORDERS Anxiety     negative neurological ROS     GI/Hepatic ,GERD  ,,Lab Results      Component                Value               Date                      ALT                      102 (H)             05/23/2023                AST                      56 (H)              05/23/2023                ALKPHOS                  283 (H)             05/23/2023                BILITOT                  2.8 (H)             05/23/2023              CHOLECYSITIS   Endo/Other  Hypothyroidism    Renal/GU      Musculoskeletal  (+) Arthritis ,    Abdominal   Peds  Hematology negative hematology ROS (+)   Anesthesia Other Findings   Reproductive/Obstetrics                             Anesthesia Physical Anesthesia Plan  ASA: 2  Anesthesia Plan: General   Post-op Pain Management: Ofirmev IV (intra-op)* and Toradol IV (intra-op)*   Induction: Intravenous  PONV Risk Score and Plan: 2 and Ondansetron and Dexamethasone  Airway Management Planned: Oral ETT  Additional Equipment: None  Intra-op Plan:   Post-operative Plan:   Informed Consent: I have reviewed the patients History and Physical, chart, labs and discussed the procedure including the risks, benefits and alternatives for the proposed anesthesia with the patient or authorized  representative who has indicated his/her understanding and acceptance.     Dental advisory given  Plan Discussed with: CRNA  Anesthesia Plan Comments:        Anesthesia Quick Evaluation

## 2023-05-23 NOTE — Op Note (Signed)
Procedure Note  Pre-operative Diagnosis:  acute cholecystitis, cholelithiasis, history of choledocholithiasis  Post-operative Diagnosis:  same  Surgeon:  Darnell Level, MD  Assistant:  Luretha Murphy, MD   Procedure:  Laparoscopic cholecystectomy with drain placement  Anesthesia:  General  Estimated Blood Loss:  50 cc  Drains: 19Fr Blake drain to subhepatic space         Specimen: gallbladder to pathology  Indications: Patient is a 67 year old male admitted to the medical service with acute cholecystitis, cholelithiasis, and choledocholithiasis.  Patient is on chronic anticoagulation for atrial fibrillation.  After being prepared medically, the patient underwent ERCP with stone extraction.  Patient now comes to the operating room for cholecystectomy.  Procedure description: The patient was seen in the pre-op holding area. The risks, benefits, complications, treatment options, and expected outcomes were previously discussed with the patient. The patient agreed with the proposed plan and has signed the informed consent form.  The patient was transported to operating room #4 at the [facility]. The patient was placed in the supine position on the operating room table. Following induction of general anesthesia, the abdomen was prepped and draped in the usual aseptic fashion.  An incision was made in the skin near the umbilicus. The midline fascia was incised and the peritoneal cavity was entered and a Hasson cannula was introduced under direct vision. The cannula was secured with a 0-Vicryl pursestring suture. Pneumoperitoneum was established with carbon dioxide. Additional cannulae were introduced under direct vision along the right costal margin in the midline, mid-clavicular line, and anterior axillary line.   The gallbladder was identified and the fundus grasped and retracted cephalad.  Using a trocar, the gallbladder was aspirated.  Adhesions were taken down bluntly and the  electrocautery was utilized as needed, taking care not to involve any adjacent structures. The infundibulum was grasped and retracted laterally, exposing the peritoneum overlying the triangle of Calot.  The gallbladder wall was markedly thickened.  Peritoneum was markedly thickened with inflammatory changes and edema.  The peritoneum was incised and structures exposed with blunt dissection. The cystic duct was clearly identified, bluntly dissected circumferentially, and clipped proximally and distally and divided.  The cystic artery was identified, dissected circumferentially, ligated with ligaclips, and divided.  The gallbladder was dissected away from the gallbladder bed using the electrocautery for hemostasis.  Small vessels were divided between ligaclips.  The gallbladder was completely removed from the liver and placed into an endocatch bag.  The right upper quadrant was irrigated and the gallbladder bed was inspected. Hemostasis was achieved with the electrocautery.  A sheet of "snow" was placed in the gallbladder bed.  A 19 [place] Blake drain was brought into the peritoneal cavity and exited through the right lateral port site.  The drain was placed in the subhepatic space.  It was secured to the skin with a nylon suture.  Gallbladder was retrieved through the umbilical port and removed.  It was submitted to pathology.  Umbilical wound was closed with the pursestring suture.  Cannulae were removed under direct vision and good hemostasis was noted. Pneumoperitoneum was released and the majority of the carbon dioxide evacuated.  Local anesthetic was infiltrated at all port sites. Skin incisions were closed with 4-0 Monocril subcuticular sutures and Dermabond was applied.  Instrument, sponge, and needle counts were correct at the conclusion of the case.  The patient was awakened from anesthesia and brought to the recovery room in stable condition.  The patient tolerated the procedure well.  Darnell Level, MD [facility] Surgery Office: [phone]

## 2023-05-23 NOTE — Progress Notes (Signed)
   05/23/23 2319  BiPAP/CPAP/SIPAP  BiPAP/CPAP/SIPAP Pt Type Adult (Pt already on the cpap)  BiPAP/CPAP/SIPAP DREAMSTATIOND  Mask Type Nasal mask  Mask Size Medium  Set Rate 0 breaths/min  Respiratory Rate 19 breaths/min  FiO2 (%) 21 %  Patient Home Equipment No  Auto Titrate Yes (auto 16/5 per pts request his home settings)  CPAP/SIPAP surface wiped down Yes  BiPAP/CPAP /SiPAP Vitals  Pulse Rate 61  Resp 20  SpO2 100 %  Bilateral Breath Sounds Clear;Diminished  MEWS Score/Color  MEWS Score 0  MEWS Score Color Roberto Sanford

## 2023-05-23 NOTE — Anesthesia Procedure Notes (Signed)
Procedure Name: Intubation Date/Time: 05/23/2023 9:26 AM  Performed by: Florene Route, CRNAPre-anesthesia Checklist: Patient identified, Emergency Drugs available, Suction available and Patient being monitored Patient Re-evaluated:Patient Re-evaluated prior to induction Oxygen Delivery Method: Circle system utilized Preoxygenation: Pre-oxygenation with 100% oxygen Induction Type: IV induction Ventilation: Mask ventilation without difficulty Laryngoscope Size: Miller and 3 Grade View: Grade II Tube type: Oral Tube size: 8.0 mm Number of attempts: 1 Airway Equipment and Method: Stylet and Oral airway Placement Confirmation: ETT inserted through vocal cords under direct vision, positive ETCO2 and breath sounds checked- equal and bilateral Secured at: 22 cm Tube secured with: Tape Dental Injury: Teeth and Oropharynx as per pre-operative assessment

## 2023-05-23 NOTE — Progress Notes (Signed)
ANTICOAGULATION CONSULT NOTE - Follow Up  Pharmacy Consult for heparin Indication: atrial fibrillation (apixaban on hold)  No Known Allergies  Patient Measurements: Height: 5\' 8"  (172.7 cm) Weight: 95 kg (209 lb 7 oz) IBW/kg (Calculated) : 68.4 Heparin Dosing Weight: 88 kg  Vital Signs: Temp: 97.4 F (36.3 C) (06/14 1200) Temp Source: Oral (06/14 1200) BP: 135/95 (06/14 1200) Pulse Rate: 68 (06/14 1200)  Labs: Recent Labs    05/21/23 0642 05/21/23 1242 05/21/23 1826 05/22/23 0520 05/22/23 0702 05/23/23 0516  HGB 15.5  --   --  13.7  --  13.9  HCT 47.1  --   --  40.8  --  42.9  PLT 247  --   --  229  --  215  APTT 36 71*  --  72*  --   --   HEPARINUNFRC 0.13* 0.41 0.46 0.35  --   --   CREATININE 0.82  --   --   --  0.96 0.98    Estimated Creatinine Clearance: 82.9 mL/min (by C-G formula based on SCr of 0.98 mg/dL).   Medical History: Past Medical History:  Diagnosis Date   Anxiety    Arrhythmia    Arthritis    Atrial fibrillation (HCC)    GERD (gastroesophageal reflux disease)    Hypothyroidism    Overactive bladder    takes vesicare and elavil   Sleep apnea    uses CPAP nightly    Medications: PTA apixaban 5 mg PO BID  -Last dose: 6/10 @ 0800  Assessment: Pt is a 36 yoM presenting with abdominal pain, nausea. Pt found to have cholecystitis w/gallstones. Anticoagulation was transition to heparin drip for peri-operative management. Pt underwent ERCP on 6/13, lap chole 6/14.   Significant Events: -6/13 ERCP: Heparin held 4 hrs prior, resumed 4 hours after -6/14 Lap chole: Heparin off at 0106 per MAR.   Today, 05/23/23 CBC: Stable, WNL SCr WNL Most recent heparin level obtained on 6/13 was therapeutic on heparin infusion of 1350 units/hr  Spoke with Dr. Gerrit Friends regarding peri-operative timing of heparin drip. Per MD, ok to resume heparin (with no bolus) 12 hours post-op.  Goal of Therapy:  Heparin level 0.3-0.7 units/ml Monitor platelets by  anticoagulation protocol: Yes   Plan:  No bolus since immediately post-op Resume heparin infusion at previously therapeutic rate of 1350 units/hr @ 2300 this evening (12 hours post-op) Check heparin level 6 hours after restarting CBC, heparin level daily  Monitor for signs of bleeding  Cindi Carbon, PharmD 05/23/2023,12:28 PM

## 2023-05-24 ENCOUNTER — Encounter (HOSPITAL_COMMUNITY): Payer: Self-pay | Admitting: Surgery

## 2023-05-24 DIAGNOSIS — K8000 Calculus of gallbladder with acute cholecystitis without obstruction: Secondary | ICD-10-CM | POA: Diagnosis not present

## 2023-05-24 LAB — COMPREHENSIVE METABOLIC PANEL
ALT: 82 U/L — ABNORMAL HIGH (ref 0–44)
AST: 47 U/L — ABNORMAL HIGH (ref 15–41)
Albumin: 2.8 g/dL — ABNORMAL LOW (ref 3.5–5.0)
Alkaline Phosphatase: 202 U/L — ABNORMAL HIGH (ref 38–126)
Anion gap: 7 (ref 5–15)
BUN: 16 mg/dL (ref 8–23)
CO2: 25 mmol/L (ref 22–32)
Calcium: 8.5 mg/dL — ABNORMAL LOW (ref 8.9–10.3)
Chloride: 102 mmol/L (ref 98–111)
Creatinine, Ser: 0.94 mg/dL (ref 0.61–1.24)
GFR, Estimated: >60 mL/min (ref >60)
Glucose, Bld: 123 mg/dL — ABNORMAL HIGH (ref 70–99)
Potassium: 4.3 mmol/L (ref 3.5–5.1)
Sodium: 134 mmol/L — ABNORMAL LOW (ref 135–145)
Total Bilirubin: 1.5 mg/dL — ABNORMAL HIGH (ref 0.3–1.2)
Total Protein: 6.1 g/dL — ABNORMAL LOW (ref 6.5–8.1)

## 2023-05-24 LAB — CULTURE, BLOOD (ROUTINE X 2)
Culture: NO GROWTH
Special Requests: ADEQUATE

## 2023-05-24 LAB — HEPARIN LEVEL (UNFRACTIONATED)
Heparin Unfractionated: 0.3 IU/mL (ref 0.30–0.70)
Heparin Unfractionated: 0.37 IU/mL (ref 0.30–0.70)

## 2023-05-24 LAB — GLUCOSE, CAPILLARY
Glucose-Capillary: 127 mg/dL — ABNORMAL HIGH (ref 70–99)
Glucose-Capillary: 99 mg/dL (ref 70–99)
Glucose-Capillary: 135 mg/dL — ABNORMAL HIGH (ref 70–99)
Glucose-Capillary: 131 mg/dL — ABNORMAL HIGH (ref 70–99)

## 2023-05-24 LAB — CBC
HCT: 39.4 % (ref 39.0–52.0)
Hemoglobin: 12.7 g/dL — ABNORMAL LOW (ref 13.0–17.0)
MCH: 29.8 pg (ref 26.0–34.0)
MCHC: 32.2 g/dL (ref 30.0–36.0)
MCV: 92.5 fL (ref 80.0–100.0)
Platelets: 258 10*3/uL (ref 150–400)
RBC: 4.26 MIL/uL (ref 4.22–5.81)
RDW: 14.2 % (ref 11.5–15.5)
WBC: 12.6 10*3/uL — ABNORMAL HIGH (ref 4.0–10.5)
nRBC: 0 % (ref 0.0–0.2)

## 2023-05-24 MED ORDER — ORAL CARE MOUTH RINSE: 15.0000 mL | OROMUCOSAL | Status: DC | PRN

## 2023-05-24 MED ORDER — MELATONIN 5 MG PO TABS: 5.0000 mg | ORAL_TABLET | Freq: Every evening | ORAL | Status: DC | PRN

## 2023-05-24 MED ORDER — ZOLPIDEM TARTRATE 5 MG PO TABS: 5.0000 mg | ORAL_TABLET | Freq: Every evening | ORAL | Status: DC | PRN

## 2023-05-24 MED ORDER — HEPARIN (PORCINE) 25000 UT/250ML-% IV SOLN
1600.0000 [IU]/h | INTRAVENOUS | Status: AC
  Administered 2023-05-24: 1400 [IU]/h via INTRAVENOUS
  Filled 2023-05-24 (×2): qty 250

## 2023-05-24 NOTE — Progress Notes (Signed)
ANTICOAGULATION CONSULT NOTE - Follow Up  Pharmacy Consult for heparin Indication: atrial fibrillation (apixaban on hold)  No Known Allergies  Patient Measurements: Height: 5\' 8"  (172.7 cm) Weight: 95 kg (209 lb 7 oz) IBW/kg (Calculated) : 68.4 Heparin Dosing Weight: 88 kg  Vital Signs: Temp: 98.2 F (36.8 C) (06/15 1100) Temp Source: Oral (06/15 1100) BP: 103/75 (06/15 1400) Pulse Rate: 48 (06/15 1400)  Labs: Recent Labs    05/22/23 0520 05/22/23 0702 05/23/23 0516 05/24/23 0526 05/24/23 1517  HGB 13.7  --  13.9 12.7*  --   HCT 40.8  --  42.9 39.4  --   PLT 229  --  215 258  --   APTT 72*  --   --   --   --   HEPARINUNFRC 0.35  --   --  0.30 0.37  CREATININE  --  0.96 0.98 0.94  --      Estimated Creatinine Clearance: 86.4 mL/min (by C-G formula based on SCr of 0.94 mg/dL).   Medical History: Past Medical History:  Diagnosis Date   Anxiety    Arrhythmia    Arthritis    Atrial fibrillation (HCC)    GERD (gastroesophageal reflux disease)    Hypothyroidism    Overactive bladder    takes vesicare and elavil   Sleep apnea    uses CPAP nightly    Medications: PTA apixaban 5 mg PO BID  -Last dose: 6/10 @ 0800  Assessment: Pt is a 19 yoM presenting with abdominal pain, nausea. Pt found to have cholecystitis w/gallstones. Anticoagulation was transition to heparin drip for peri-operative management. Pt underwent ERCP on 6/13, lap chole 6/14.   Significant Events: -6/13 ERCP: Heparin held 4 hrs prior, resumed 4 hours after -6/14 Lap chole: Heparin off at 0106 per MAR. Resumed 12 hrs post-op  Today, 05/24/23 Heparin level = 0.30 is on the lower end of therapeutic range on heparin infusion of 1350 units/hr Repeat level is 0.37, therapeutic  on 1400 units/hr  CBC: Hgb slightly decreased post-op, Plt WNL & stable Confirmed with RN - no signs of bleeding or line issues/interruptions.  Goal of Therapy:  Heparin level 0.3-0.7 units/ml Monitor platelets by  anticoagulation protocol: Yes   Plan:  No bolus since POD#1 Continue heparin infusion at  1400 units/hr CBC, heparin level daily  Monitor for signs of bleeding  Adalberto Cole, PharmD, BCPS 05/24/2023 4:54 PM

## 2023-05-24 NOTE — Progress Notes (Signed)
ANTICOAGULATION CONSULT NOTE - Follow Up  Pharmacy Consult for heparin Indication: atrial fibrillation (apixaban on hold)  No Known Allergies  Patient Measurements: Height: 5\' 8"  (172.7 cm) Weight: 95 kg (209 lb 7 oz) IBW/kg (Calculated) : 68.4 Heparin Dosing Weight: 88 kg  Vital Signs: Temp: 97.4 F (36.3 C) (06/15 0449) Temp Source: Oral (06/15 0449) BP: 115/79 (06/15 0600) Pulse Rate: 38 (06/15 0600)  Labs: Recent Labs    05/21/23 1242 05/21/23 1826 05/22/23 0520 05/22/23 0520 05/22/23 0702 05/23/23 0516 05/24/23 0526  HGB  --   --  13.7   < >  --  13.9 12.7*  HCT  --   --  40.8  --   --  42.9 39.4  PLT  --   --  229  --   --  215 258  APTT 71*  --  72*  --   --   --   --   HEPARINUNFRC 0.41 0.46 0.35  --   --   --  0.30  CREATININE  --   --   --   --  0.96 0.98  --    < > = values in this interval not displayed.     Estimated Creatinine Clearance: 82.9 mL/min (by C-G formula based on SCr of 0.98 mg/dL).   Medical History: Past Medical History:  Diagnosis Date   Anxiety    Arrhythmia    Arthritis    Atrial fibrillation (HCC)    GERD (gastroesophageal reflux disease)    Hypothyroidism    Overactive bladder    takes vesicare and elavil   Sleep apnea    uses CPAP nightly    Medications: PTA apixaban 5 mg PO BID  -Last dose: 6/10 @ 0800  Assessment: Pt is a 46 yoM presenting with abdominal pain, nausea. Pt found to have cholecystitis w/gallstones. Anticoagulation was transition to heparin drip for peri-operative management. Pt underwent ERCP on 6/13, lap chole 6/14.   Significant Events: -6/13 ERCP: Heparin held 4 hrs prior, resumed 4 hours after -6/14 Lap chole: Heparin off at 0106 per MAR. Resumed 12 hrs post-op  Today, 05/24/23 Heparin level = 0.30 is on the lower end of therapeutic range on heparin infusion of 1350 units/hr CBC: Hgb slightly decreased post-op, Plt WNL & stable Confirmed with RN - no signs of bleeding or line  issues/interruptions.  Goal of Therapy:  Heparin level 0.3-0.7 units/ml Monitor platelets by anticoagulation protocol: Yes   Plan:  No bolus since POD#1 Slightly increase heparin infusion to 1400 units/hr Check heparin level 6 hours after rate change CBC, heparin level daily  Monitor for signs of bleeding  Cindi Carbon, PharmD 05/24/2023,7:29 AM

## 2023-05-24 NOTE — Progress Notes (Signed)
PROGRESS NOTE    Roberto Sanford  NWG:[id number] DOB: [birth year] DOA: 05/20/2023 PCP: Merri Brunette, MD   Brief Narrative: 67 year old male with history of A-fib on Eliquis, hypothyroidism, obstructive sleep apnea on CPAP anxiety depression admitted with abdominal pain jaundice nausea without vomiting.  He was found to have acute cholecystitis by CT with possible choledocholithiasis.  MRCP with CBD and cystic duct stones.  Patient scheduled to have ERCP 05/22/2023.  GI and general surgery following.  Assessment & Plan:   Principal Problem:   Acute calculous cholecystitis   #1 acute cholecystitis with choledocholithiasis s/p  ERCP 05/22/2023 Dr. Sol Passer bile duct was moderately dilated with a stone causing an obstruction, biliary sphincterotomy was performed, biliary tree was swept and sludge was found.  Status post lap chole with drain placement 05/23/2023 Dr. Gerrit Friends  He went into rapid A-fib RVR.  Started on Cardizem and sent to stepdown unit.  In stepdown unit his heart rate dropped to the 40s with some pauses and Cardizem drip was stopped at that point.  Continue heparin drip. T bili 1.5 from 8 Continue Zosyn   #2 permanent atrial fibrillation -patient went into rapid A-fib.  So he was sent to stepdown unit from surgery.  This has resolved.  Was on Eliquis that he was taking at home which is on hold at this time.  #3 hypothyroidism on levothyroxine  #4 anxiety/depression on amitriptyline  #5 obstructive sleep apnea cpap   #6 hyponatremia stable monitor continue NS  #7 hyperlipidemia on Crestor  #8 hypertension blood pressure 110/78 Home meds still on hold. Estimated body mass index is 31.84 kg/m as calculated from the following:   Height as of this encounter: 5\' 8"  (1.727 m).   Weight as of this encounter: 95 kg.  DVT prophylaxis: Heparin  code Status: Full code  family Communication: None at bedside  disposition Plan:  Status is: Inpatient Remains inpatient appropriate  because: Acute calculus cholecystitis with CBD and and cystic duct stones   Consultants:  GI and general surgery  Procedures: MRCP For ERCP on 05/22/2023 Antimicrobials: Zosyn  Subjective:   No specific complaints feeling better anxious to go to regular floor and go home JP drain in place with blood-tinged drainage noted Objective: Vitals:   05/24/23 0813 05/24/23 0914 05/24/23 1000 05/24/23 1100  BP:  109/77 110/78   Pulse:  72 (!) 42   Resp:   13   Temp: 97.7 F (36.5 C)   98.2 F (36.8 C)  TempSrc: Oral   Oral  SpO2:  98% 98%   Weight:      Height:        Intake/Output Summary (Last 24 hours) at 05/24/2023 1239 Last data filed at 05/24/2023 1100 Gross per 24 hour  Intake 920.33 ml  Output 1415 ml  Net -494.67 ml    Filed Weights   05/21/23 0657 05/23/23 0819 05/23/23 1200  Weight: 93.2 kg 93.2 kg 95 kg    Examination:  General exam: Appears in no acute distress appears jaundiced Respiratory system: Clear to auscultation. Respiratory effort normal. Cardiovascular system: S1 & S2 heard, RRR. No JVD, murmurs, rubs, gallops or clicks. No pedal edema. Gastrointestinal system: Abdomen is nondistended, soft and nontender. No organomegaly or masses felt. Normal bowel sounds heard.  Right-sided JP drain in place in the abdomen with blood-tinged drainage. Central nervous system: Alert and oriented. No focal neurological deficits. Extremities: Symmetric 5 x 5 power. Skin: No rashes, lesions or ulcers Psychiatry: Judgement and insight appear normal.  Mood & affect appropriate.     Data Reviewed: I have personally reviewed following labs and imaging studies  CBC: Recent Labs  Lab 05/20/23 1759 05/21/23 0642 05/22/23 0520 05/23/23 0516 05/24/23 0526  WBC 10.2 9.2 7.1 5.3 12.6*  NEUTROABS 8.9*  --   --   --   --   HGB 15.2 15.5 13.7 13.9 12.7*  HCT 45.1 47.1 40.8 42.9 39.4  MCV 90.2 90.9 90.9 93.1 92.5  PLT 262 247 229 215 258    Basic Metabolic Panel: Recent  Labs  Lab 05/20/23 1759 05/21/23 0642 05/22/23 0702 05/23/23 0516 05/24/23 0526  NA 129* 132* 134* 134* 134*  K 4.2 4.3 4.0 4.0 4.3  CL 95* 97* 101 101 102  CO2 23 26 25 23 25   GLUCOSE 150* 105* 99 147* 123*  BUN 11 10 15 22 16   CREATININE 0.80 0.82 0.96 0.98 0.94  CALCIUM 8.9 8.8* 8.4* 8.6* 8.5*  MG  --  2.2  --   --   --   PHOS  --  3.8  --   --   --     GFR: Estimated Creatinine Clearance: 86.4 mL/min (by C-G formula based on SCr of 0.94 mg/dL). Liver Function Tests: Recent Labs  Lab 05/20/23 1759 05/21/23 0642 05/22/23 0702 05/23/23 0516 05/24/23 0526  AST 313* 201* 104* 56* 47*  ALT 221* 224* 138* 102* 82*  ALKPHOS 202* 238* 274* 283* 202*  BILITOT 3.4* 8.0* 8.1* 2.8* 1.5*  PROT 8.3* 7.4 6.5 6.6 6.1*  ALBUMIN 4.2 3.7 3.1* 3.1* 2.8*    Recent Labs  Lab 05/20/23 1759  LIPASE 32    No results for input(s): "AMMONIA" in the last 168 hours. Coagulation Profile: No results for input(s): "INR", "PROTIME" in the last 168 hours. Cardiac Enzymes: No results for input(s): "CKTOTAL", "CKMB", "CKMBINDEX", "TROPONINI" in the last 168 hours. BNP (last 3 results) No results for input(s): "PROBNP" in the last 8760 hours. HbA1C: No results for input(s): "HGBA1C" in the last 72 hours.  CBG: Recent Labs  Lab 05/23/23 1600 05/23/23 1944 05/23/23 2224 05/24/23 0756 05/24/23 1159  GLUCAP 168* 145* 184* 127* 99    Lipid Profile: No results for input(s): "CHOL", "HDL", "LDLCALC", "TRIG", "CHOLHDL", "LDLDIRECT" in the last 72 hours. Thyroid Function Tests: Recent Labs    05/23/23 0516  TSH 2.501   Anemia Panel: No results for input(s): "VITAMINB12", "FOLATE", "FERRITIN", "TIBC", "IRON", "RETICCTPCT" in the last 72 hours. Sepsis Labs: Recent Labs  Lab 05/20/23 1745 05/20/23 2128  LATICACIDVEN 2.1* 1.6     Recent Results (from the past 240 hour(s))  Blood culture (routine x 2)     Status: None (Preliminary result)   Collection Time: 05/20/23  7:25 PM    Specimen: BLOOD  Result Value Ref Range Status   Specimen Description   Final    BLOOD LEFT ANTECUBITAL Performed at [facility], [address] [place], [place] [address]    Special Requests   Final    BOTTLES DRAWN AEROBIC AND ANAEROBIC Blood Culture adequate volume Performed at [facility], [address], [place], [place] [address]    Culture   Final    NO GROWTH 4 DAYS Performed at [facility], [address] [place], [place] [address]    Report Status PENDING  Incomplete  Blood culture (routine x 2)     Status: None (Preliminary result)   Collection Time: 05/20/23  7:30 PM  Specimen: BLOOD  Result Value Ref Range Status   Specimen Description   Final    BLOOD RIGHT ANTECUBITAL Performed at [facility], [address] [place], [place] [address]    Special Requests   Final    BOTTLES DRAWN AEROBIC AND ANAEROBIC Blood Culture adequate volume Performed at [facility], [address] [place], [place] [address]    Culture   Final    NO GROWTH 4 DAYS Performed at [facility], [address], [place], [place] [address]    Report Status PENDING  Incomplete  MRSA Next Gen by PCR, Nasal     Status: None   Collection Time: 05/23/23 11:55 AM   Specimen: Nasal Mucosa; Nasal Swab  Result Value Ref Range Status   MRSA by PCR Next Gen NOT DETECTED NOT DETECTED Final    Comment: (NOTE) The GeneXpert MRSA Assay (FDA approved for NASAL specimens only), is one component of a comprehensive MRSA colonization surveillance program. It is not intended to diagnose MRSA infection nor to guide or monitor treatment for MRSA infections. Test performance is not FDA approved in patients less than 57 years old. Performed at [facility], [address] [place], [place] [address]          Radiology Studies: DG ERCP  Result Date: 05/23/2023 CLINICAL DATA:  Cholecystitis and choledocholithiasis EXAM: ERCP  TECHNIQUE: Multiple spot images obtained with the fluoroscopic device and submitted for interpretation post-procedure. FLUOROSCOPY: Radiation Exposure Index (as provided by the fluoroscopic device): 17.55 mGy Kerma COMPARISON:  MRCP 05/21/2023 FINDINGS: A total of 7 intraoperative saved images are submitted for review. The images demonstrate a flexible duodenal scope in the descending duodenum with wire cannulation of the common bile duct. Subsequent cholangiography confirms patency of the cystic duct with opacification of the gallbladder. IMPRESSION: 1. ERCP as above. 2. Contrast passes through the cystic duct and opacifies the gallbladder. These images were submitted for radiologic interpretation only. Please see the procedural report for the amount of contrast and the fluoroscopy time utilized. Electronically Signed   By: Malachy Moan M.D.   On: 05/23/2023 06:48        Scheduled Meds:  amitriptyline  25 mg Oral QHS   Chlorhexidine Gluconate Cloth  6 each Topical Daily   insulin aspart  0-9 Units Subcutaneous TID WC   levothyroxine  100 mcg Oral Q0600   multivitamin with minerals  1 tablet Oral Daily   rosuvastatin  10 mg Oral Daily   Continuous Infusions:  sodium chloride Stopped (05/24/23 0510)   diltiazem (CARDIZEM) infusion Stopped (05/23/23 1150)   heparin 1,400 Units/hr (05/24/23 1100)   piperacillin-tazobactam Stopped (05/24/23 0915)     LOS: 3 days    Time spent: 39 min  Alwyn Ren, MD  05/24/2023, 12:39 PM

## 2023-05-24 NOTE — Plan of Care (Signed)

## 2023-05-24 NOTE — Plan of Care (Signed)

## 2023-05-24 NOTE — Progress Notes (Signed)
1 Day Post-Op Lap chole Subjective: Feeling well, no issues overnight  Objective: Vital signs in last 24 hours: Temp:  [97.4 F (36.3 C)-98 F (36.7 C)] 97.4 F (36.3 C) (06/15 0449) Pulse Rate:  [38-87] 38 (06/15 0600) Resp:  [0-21] 14 (06/15 0600) BP: (95-145)/(67-101) 115/79 (06/15 0600) SpO2:  [95 %-100 %] 97 % (06/15 0600) FiO2 (%):  [21 %] 21 % (06/14 2319) Weight:  [93.2 kg-95 kg] 95 kg (06/14 1200)   Intake/Output from previous day: 06/14 0701 - 06/15 0700 In: 2012.6 [P.O.:480; I.V.:1313.3; IV Piggyback:219.2] Out: 1430 [Urine:1250; Drains:130; Blood:50] Intake/Output this shift: No intake/output data recorded.   General appearance: alert and cooperative GI: soft JP: bilious output noted Incision: no significant drainage  Lab Results:  Recent Labs    05/23/23 0516 05/24/23 0526  WBC 5.3 12.6*  HGB 13.9 12.7*  HCT 42.9 39.4  PLT 215 258   BMET Recent Labs    05/23/23 0516 05/24/23 0526  NA 134* 134*  K 4.0 4.3  CL 101 102  CO2 23 25  GLUCOSE 147* 123*  BUN 22 16  CREATININE 0.98 0.94  CALCIUM 8.6* 8.5*   PT/INR No results for input(s): "LABPROT", "INR" in the last 72 hours. ABG No results for input(s): "PHART", "HCO3" in the last 72 hours.  Invalid input(s): "PCO2", "PO2"  MEDS, Scheduled  amitriptyline  25 mg Oral QHS   Chlorhexidine Gluconate Cloth  6 each Topical Daily   insulin aspart  0-9 Units Subcutaneous TID WC   levothyroxine  100 mcg Oral Q0600   multivitamin with minerals  1 tablet Oral Daily   rosuvastatin  10 mg Oral Daily    Studies/Results: DG ERCP  Result Date: 05/23/2023 CLINICAL DATA:  Cholecystitis and choledocholithiasis EXAM: ERCP TECHNIQUE: Multiple spot images obtained with the fluoroscopic device and submitted for interpretation post-procedure. FLUOROSCOPY: Radiation Exposure Index (as provided by the fluoroscopic device): 17.55 mGy Kerma COMPARISON:  MRCP 05/21/2023 FINDINGS: A total of 7 intraoperative saved  images are submitted for review. The images demonstrate a flexible duodenal scope in the descending duodenum with wire cannulation of the common bile duct. Subsequent cholangiography confirms patency of the cystic duct with opacification of the gallbladder. IMPRESSION: 1. ERCP as above. 2. Contrast passes through the cystic duct and opacifies the gallbladder. These images were submitted for radiologic interpretation only. Please see the procedural report for the amount of contrast and the fluoroscopy time utilized. Electronically Signed   By: Malachy Moan M.D.   On: 05/23/2023 06:48    Assessment: s/p Procedure(s): LAPAROSCOPIC CHOLECYSTECTOMY Patient Active Problem List   Diagnosis Date Noted   Acute calculous cholecystitis 05/20/2023   Shortness of breath 06/28/2021   A-fib (HCC) 04/26/2021   GERD (gastroesophageal reflux disease) 04/26/2021   Increased frequency of urination 04/26/2021   OSA (obstructive sleep apnea) 04/26/2021   Thyroid disease 04/26/2021   Complete tear of right rotator cuff 11/18/2017   Morbid obesity, unspecified obesity type (HCC) 09/12/2017   Subacromial bursitis 09/12/2017   History of adenomatous polyp of colon 04/25/2016    POD 1 Lap chole  Plan: Advance diet Monitor JP output for another 24h  AF per primary team.  Would wait to restart PO anticoagulation  LOS: 3 days     .Vanita Panda, MD [facility], [place]    05/24/2023 7:49 AM

## 2023-05-24 NOTE — Progress Notes (Signed)
   05/24/23 2246  BiPAP/CPAP/SIPAP  BiPAP/CPAP/SIPAP Pt Type Adult (Pt prefer self placement machine ready to be worn, encourged to let RN know if help of RT needed)  BiPAP/CPAP/SIPAP DREAMSTATIOND  Mask Type Nasal mask  Mask Size Medium  Respiratory Rate 18 breaths/min  FiO2 (%) 21 %  Patient Home Equipment No  Auto Titrate Yes (auto 16/5)  CPAP/SIPAP surface wiped down Yes

## 2023-05-25 DIAGNOSIS — K8000 Calculus of gallbladder with acute cholecystitis without obstruction: Secondary | ICD-10-CM | POA: Diagnosis not present

## 2023-05-25 LAB — CULTURE, BLOOD (ROUTINE X 2)
Culture: NO GROWTH
Special Requests: ADEQUATE

## 2023-05-25 LAB — COMPREHENSIVE METABOLIC PANEL
ALT: 67 U/L — ABNORMAL HIGH (ref 0–44)
AST: 38 U/L (ref 15–41)
Albumin: 2.6 g/dL — ABNORMAL LOW (ref 3.5–5.0)
Alkaline Phosphatase: 183 U/L — ABNORMAL HIGH (ref 38–126)
Anion gap: 8 (ref 5–15)
BUN: 17 mg/dL (ref 8–23)
CO2: 25 mmol/L (ref 22–32)
Calcium: 8.3 mg/dL — ABNORMAL LOW (ref 8.9–10.3)
Chloride: 104 mmol/L (ref 98–111)
Creatinine, Ser: 0.92 mg/dL (ref 0.61–1.24)
GFR, Estimated: >60 mL/min (ref >60)
Glucose, Bld: 113 mg/dL — ABNORMAL HIGH (ref 70–99)
Potassium: 3.6 mmol/L (ref 3.5–5.1)
Sodium: 137 mmol/L (ref 135–145)
Total Bilirubin: 1.4 mg/dL — ABNORMAL HIGH (ref 0.3–1.2)
Total Protein: 5.5 g/dL — ABNORMAL LOW (ref 6.5–8.1)

## 2023-05-25 LAB — CBC
HCT: 38 % — ABNORMAL LOW (ref 39.0–52.0)
Hemoglobin: 12.1 g/dL — ABNORMAL LOW (ref 13.0–17.0)
MCH: 29.9 pg (ref 26.0–34.0)
MCHC: 31.8 g/dL (ref 30.0–36.0)
MCV: 93.8 fL (ref 80.0–100.0)
Platelets: 201 10*3/uL (ref 150–400)
RBC: 4.05 MIL/uL — ABNORMAL LOW (ref 4.22–5.81)
RDW: 14.3 % (ref 11.5–15.5)
WBC: 6.9 10*3/uL (ref 4.0–10.5)
nRBC: 0 % (ref 0.0–0.2)

## 2023-05-25 LAB — GLUCOSE, CAPILLARY
Glucose-Capillary: 97 mg/dL (ref 70–99)
Glucose-Capillary: 83 mg/dL (ref 70–99)
Glucose-Capillary: 118 mg/dL — ABNORMAL HIGH (ref 70–99)
Glucose-Capillary: 98 mg/dL (ref 70–99)

## 2023-05-25 LAB — HEPARIN LEVEL (UNFRACTIONATED): Heparin Unfractionated: 0.27 IU/mL — ABNORMAL LOW (ref 0.30–0.70)

## 2023-05-25 MED ORDER — APIXABAN 5 MG PO TABS
5.0000 mg | ORAL_TABLET | Freq: Two times a day (BID) | ORAL | Status: DC
  Administered 2023-05-25 – 2023-05-26 (×3): 5 mg via ORAL
  Filled 2023-05-25 (×3): qty 1

## 2023-05-25 NOTE — Progress Notes (Addendum)
2 Days Post-Op Lap chole Subjective: Feeling well, no issues overnight  Objective: Vital signs in last 24 hours: Temp:  [97.7 F (36.5 C)-98.2 F (36.8 C)] 97.9 F (36.6 C) (06/16 0732) Pulse Rate:  [42-95] 88 (06/16 0600) Resp:  [12-24] 14 (06/16 0600) BP: (103-153)/(48-95) 153/86 (06/16 0330) SpO2:  [90 %-100 %] 98 % (06/16 0600) FiO2 (%):  [21 %] 21 % (06/15 2246)   Intake/Output from previous day: 06/15 0701 - 06/16 0700 In: 1249.3 [P.O.:720; I.V.:373.9; IV Piggyback:155.5] Out: 1655 [Urine:1500; Drains:155] Intake/Output this shift: No intake/output data recorded.   General appearance: alert and cooperative GI: soft JP: bilious output noted Incision: no significant drainage  Lab Results:  Recent Labs    05/24/23 0526 05/25/23 0302  WBC 12.6* 6.9  HGB 12.7* 12.1*  HCT 39.4 38.0*  PLT 258 201    BMET Recent Labs    05/24/23 0526 05/25/23 0302  NA 134* 137  K 4.3 3.6  CL 102 104  CO2 25 25  GLUCOSE 123* 113*  BUN 16 17  CREATININE 0.94 0.92  CALCIUM 8.5* 8.3*    PT/INR No results for input(s): "LABPROT", "INR" in the last 72 hours. ABG No results for input(s): "PHART", "HCO3" in the last 72 hours.  Invalid input(s): "PCO2", "PO2"  MEDS, Scheduled  amitriptyline  25 mg Oral QHS   Chlorhexidine Gluconate Cloth  6 each Topical Daily   insulin aspart  0-9 Units Subcutaneous TID WC   levothyroxine  100 mcg Oral Q0600   multivitamin with minerals  1 tablet Oral Daily   rosuvastatin  10 mg Oral Daily    Studies/Results: No results found.  Assessment: s/p Procedure(s): LAPAROSCOPIC CHOLECYSTECTOMY Patient Active Problem List   Diagnosis Date Noted   Acute calculous cholecystitis 05/20/2023   Shortness of breath 06/28/2021   A-fib (HCC) 04/26/2021   GERD (gastroesophageal reflux disease) 04/26/2021   Increased frequency of urination 04/26/2021   OSA (obstructive sleep apnea) 04/26/2021   Thyroid disease 04/26/2021   Complete tear of  right rotator cuff 11/18/2017   Morbid obesity, unspecified obesity type (HCC) 09/12/2017   Subacromial bursitis 09/12/2017   History of adenomatous polyp of colon 04/25/2016    POD 1 Lap chole  Plan: Cont Reg diet JP output still bilious but not increasing in volume.  Will need to keep JP until this clears.  Ok for transfer to floor from surgical standpoint.  JP will need to stay in place until output clears.  May need HIDA to eval for bile leak.  PO abx to cont until 6/19  AF per primary team.  Ok to restart PO anticoagulation   LOS: 4 days     .Vanita Panda, MD [facility], [place]    05/25/2023 7:41 AM

## 2023-05-25 NOTE — Progress Notes (Signed)
PROGRESS NOTE    Roberto Sanford  ZOX:[id number] DOB: [birth year] DOA: 05/20/2023 PCP: Merri Brunette, MD   Brief Narrative: 67 year old male with history of A-fib on Eliquis, hypothyroidism, obstructive sleep apnea on CPAP anxiety depression admitted with abdominal pain jaundice nausea without vomiting.  He was found to have acute cholecystitis by CT with possible choledocholithiasis.  MRCP with CBD and cystic duct stones.  Patient scheduled to have ERCP 05/22/2023.  GI and general surgery following.  Assessment & Plan:   Principal Problem:   Acute calculous cholecystitis   #1 acute cholecystitis with choledocholithiasis s/p  ERCP 05/22/2023 Dr. Sol Passer bile duct was moderately dilated with a stone causing an obstruction, biliary sphincterotomy was performed, biliary tree was swept and sludge was found.  Status post lap chole with drain placement 05/23/2023 Dr. Gerrit Friends  He went into rapid A-fib RVR.  Started on Cardizem and sent to stepdown unit.  In stepdown unit his heart rate dropped to the 40s with some pauses and Cardizem drip was stopped at that point.  Eliquis to be restarted 05/25/2023 T bili 1.4 from 8 Continue Zosyn   #2 permanent atrial fibrillation -patient went into rapid A-fib.  So he was sent to stepdown unit from surgery.  Restart Eliquis.  He was not on any rate control agents prior to admission.  His rate remains controlled today.  #3 hypothyroidism on levothyroxine  #4 anxiety/depression on amitriptyline  #5 obstructive sleep apnea cpap   #6 hyponatremia resolved DC IV fluids.    #7 hyperlipidemia on Crestor  #8 hypertension blood pressure 135/87 Home meds still on hold.   Estimated body mass index is 31.84 kg/m as calculated from the following:   Height as of this encounter: 5\' 8"  (1.727 m).   Weight as of this encounter: 95 kg.  DVT prophylaxis: Heparin  code Status: Full code  family Communication: None at bedside  disposition Plan:  Status is:  Inpatient Remains inpatient appropriate because: Acute calculus cholecystitis with CBD and and cystic duct stones   Consultants:  GI and general surgery  Procedures: MRCP For ERCP on 05/22/2023 Antimicrobials: Zosyn  Subjective:  Patient anxious to go home resting in bed slept better no nausea vomiting had last bowel movement a day ago  Objective: Vitals:   05/25/23 0400 05/25/23 0600 05/25/23 0732 05/25/23 0800  BP:    135/87  Pulse: 69 88  92  Resp: 18 14  (!) 21  Temp:   97.9 F (36.6 C)   TempSrc:   Oral   SpO2: 96% 98%  98%  Weight:      Height:        Intake/Output Summary (Last 24 hours) at 05/25/2023 1005 Last data filed at 05/25/2023 0912 Gross per 24 hour  Intake 1215.93 ml  Output 1935 ml  Net -719.07 ml    Filed Weights   05/21/23 0657 05/23/23 0819 05/23/23 1200  Weight: 93.2 kg 93.2 kg 95 kg    Examination:  General exam: Appears in no acute distress appears jaundiced Respiratory system: Clear to auscultation. Respiratory effort normal. Cardiovascular system: S1 & S2 heard, RRR. No JVD, murmurs, rubs, gallops or clicks. No pedal edema. Gastrointestinal system: Abdomen is nondistended, soft and nontender. No organomegaly or masses felt. Normal bowel sounds heard.  Right-sided JP drain in place in the abdomen with blood-tinged drainage. Central nervous system: Alert and oriented. No focal neurological deficits. Extremities: No edema, Skin: No rashes, lesions or ulcers Psychiatry: Judgement and insight appear normal. Mood &  affect appropriate.     Data Reviewed: I have personally reviewed following labs and imaging studies  CBC: Recent Labs  Lab 05/20/23 1759 05/21/23 0642 05/22/23 0520 05/23/23 0516 05/24/23 0526 05/25/23 0302  WBC 10.2 9.2 7.1 5.3 12.6* 6.9  NEUTROABS 8.9*  --   --   --   --   --   HGB 15.2 15.5 13.7 13.9 12.7* 12.1*  HCT 45.1 47.1 40.8 42.9 39.4 38.0*  MCV 90.2 90.9 90.9 93.1 92.5 93.8  PLT 262 247 229 215 258 201     Basic Metabolic Panel: Recent Labs  Lab 05/21/23 0642 05/22/23 0702 05/23/23 0516 05/24/23 0526 05/25/23 0302  NA 132* 134* 134* 134* 137  K 4.3 4.0 4.0 4.3 3.6  CL 97* 101 101 102 104  CO2 26 25 23 25 25   GLUCOSE 105* 99 147* 123* 113*  BUN 10 15 22 16 17   CREATININE 0.82 0.96 0.98 0.94 0.92  CALCIUM 8.8* 8.4* 8.6* 8.5* 8.3*  MG 2.2  --   --   --   --   PHOS 3.8  --   --   --   --     GFR: Estimated Creatinine Clearance: 88.3 mL/min (by C-G formula based on SCr of 0.92 mg/dL). Liver Function Tests: Recent Labs  Lab 05/21/23 0642 05/22/23 0702 05/23/23 0516 05/24/23 0526 05/25/23 0302  AST 201* 104* 56* 47* 38  ALT 224* 138* 102* 82* 67*  ALKPHOS 238* 274* 283* 202* 183*  BILITOT 8.0* 8.1* 2.8* 1.5* 1.4*  PROT 7.4 6.5 6.6 6.1* 5.5*  ALBUMIN 3.7 3.1* 3.1* 2.8* 2.6*    Recent Labs  Lab 05/20/23 1759  LIPASE 32    No results for input(s): "AMMONIA" in the last 168 hours. Coagulation Profile: No results for input(s): "INR", "PROTIME" in the last 168 hours. Cardiac Enzymes: No results for input(s): "CKTOTAL", "CKMB", "CKMBINDEX", "TROPONINI" in the last 168 hours. BNP (last 3 results) No results for input(s): "PROBNP" in the last 8760 hours. HbA1C: No results for input(s): "HGBA1C" in the last 72 hours.  CBG: Recent Labs  Lab 05/24/23 0756 05/24/23 1159 05/24/23 1618 05/24/23 2142 05/25/23 0742  GLUCAP 127* 99 135* 131* 97    Lipid Profile: No results for input(s): "CHOL", "HDL", "LDLCALC", "TRIG", "CHOLHDL", "LDLDIRECT" in the last 72 hours. Thyroid Function Tests: Recent Labs    05/23/23 0516  TSH 2.501    Anemia Panel: No results for input(s): "VITAMINB12", "FOLATE", "FERRITIN", "TIBC", "IRON", "RETICCTPCT" in the last 72 hours. Sepsis Labs: Recent Labs  Lab 05/20/23 1745 05/20/23 2128  LATICACIDVEN 2.1* 1.6     Recent Results (from the past 240 hour(s))  Blood culture (routine x 2)     Status: None   Collection Time: 05/20/23   7:25 PM   Specimen: BLOOD  Result Value Ref Range Status   Specimen Description   Final    BLOOD LEFT ANTECUBITAL Performed at [facility], [address] [place], [place] [address]    Special Requests   Final    BOTTLES DRAWN AEROBIC AND ANAEROBIC Blood Culture adequate volume Performed at [facility], [address], [place], [place] [address]    Culture   Final    NO GROWTH 5 DAYS Performed at [facility], [address] [place], [place] [address]    Report Status 05/25/2023 FINAL  Final  Blood culture (routine x 2)     Status: None   Collection Time:  05/20/23  7:30 PM   Specimen: BLOOD  Result Value Ref Range Status   Specimen Description   Final    BLOOD RIGHT ANTECUBITAL Performed at [facility], [address] [place], [place] [address]    Special Requests   Final    BOTTLES DRAWN AEROBIC AND ANAEROBIC Blood Culture adequate volume Performed at [facility], [address] [place], [place] [address]    Culture   Final    NO GROWTH 5 DAYS Performed at [facility], [address], [place], [place] [address]    Report Status 05/25/2023 FINAL  Final  MRSA Next Gen by PCR, Nasal     Status: None   Collection Time: 05/23/23 11:55 AM   Specimen: Nasal Mucosa; Nasal Swab  Result Value Ref Range Status   MRSA by PCR Next Gen NOT DETECTED NOT DETECTED Final    Comment: (NOTE) The GeneXpert MRSA Assay (FDA approved for NASAL specimens only), is one component of a comprehensive MRSA colonization surveillance program. It is not intended to diagnose MRSA infection nor to guide or monitor treatment for MRSA infections. Test performance is not FDA approved in patients less than 43 years old. Performed at [facility], [address] [place], [place] [address]          Radiology Studies: No results found.      Scheduled Meds:  amitriptyline  25 mg Oral QHS   apixaban  5 mg Oral  BID   Chlorhexidine Gluconate Cloth  6 each Topical Daily   insulin aspart  0-9 Units Subcutaneous TID WC   levothyroxine  100 mcg Oral Q0600   multivitamin with minerals  1 tablet Oral Daily   rosuvastatin  10 mg Oral Daily   Continuous Infusions:  sodium chloride Stopped (05/24/23 2155)   diltiazem (CARDIZEM) infusion Stopped (05/23/23 1150)   heparin Stopped (05/25/23 0949)   piperacillin-tazobactam Stopped (05/25/23 0944)     LOS: 4 days    Time spent: 39 min  Alwyn Ren, MD  05/25/2023, 10:05 AM

## 2023-05-25 NOTE — Progress Notes (Signed)
Report given. Pt stable at time of transfer 

## 2023-05-25 NOTE — Progress Notes (Signed)
ANTICOAGULATION CONSULT NOTE - Follow Up  Pharmacy Consult for heparin Indication: atrial fibrillation (apixaban on hold)  No Known Allergies  Patient Measurements: Height: 5\' 8"  (172.7 cm) Weight: 95 kg (209 lb 7 oz) IBW/kg (Calculated) : 68.4 Heparin Dosing Weight: 88 kg  Vital Signs: Temp: 98.1 F (36.7 C) (06/16 0330) Temp Source: Oral (06/16 0330) BP: 153/86 (06/16 0330) Pulse Rate: 69 (06/16 0400)  Labs: Recent Labs    05/22/23 0520 05/22/23 0702 05/23/23 0516 05/24/23 0526 05/24/23 1517 05/25/23 0302  HGB 13.7  --  13.9 12.7*  --  12.1*  HCT 40.8  --  42.9 39.4  --  38.0*  PLT 229  --  215 258  --  201  APTT 72*  --   --   --   --   --   HEPARINUNFRC 0.35  --   --  0.30 0.37 0.27*  CREATININE  --    < > 0.98 0.94  --  0.92   < > = values in this interval not displayed.     Estimated Creatinine Clearance: 88.3 mL/min (by C-G formula based on SCr of 0.92 mg/dL).   Medical History: Past Medical History:  Diagnosis Date   Anxiety    Arrhythmia    Arthritis    Atrial fibrillation (HCC)    GERD (gastroesophageal reflux disease)    Hypothyroidism    Overactive bladder    takes vesicare and elavil   Sleep apnea    uses CPAP nightly    Medications: PTA apixaban 5 mg PO BID  -Last dose: 6/10 @ 0800  Assessment: Pt is a 62 yoM presenting with abdominal pain, nausea. Pt found to have cholecystitis w/gallstones. Anticoagulation was transition to heparin drip for peri-operative management. Pt underwent ERCP on 6/13, lap chole 6/14.   Significant Events: -6/13 ERCP: Heparin held 4 hrs prior, resumed 4 hours after -6/14 Lap chole: Heparin off at 0106 per MAR. Resumed 12 hrs post-op  Today, 05/25/23 HL 0.27 subtherapeutic on 1400 units/hr CBC: Hgb slightly decreased post-op, Plt WNL & stable Confirmed with RN - no signs of bleeding or line issues/interruptions.  Goal of Therapy:  Heparin level 0.3-0.7 units/ml Monitor platelets by anticoagulation  protocol: Yes   Plan:  No bolus since POD#1 Increase heparin drip to 1600 units/hr Heparin level in 6 hours CBC, heparin level daily  Monitor for signs of bleeding  Arley Phenix RPh 05/25/2023, 4:25 AM

## 2023-05-26 ENCOUNTER — Encounter (HOSPITAL_COMMUNITY): Payer: Self-pay | Admitting: Gastroenterology

## 2023-05-26 DIAGNOSIS — K8000 Calculus of gallbladder with acute cholecystitis without obstruction: Secondary | ICD-10-CM | POA: Diagnosis not present

## 2023-05-26 LAB — COMPREHENSIVE METABOLIC PANEL
ALT: 55 U/L — ABNORMAL HIGH (ref 0–44)
AST: 26 U/L (ref 15–41)
Albumin: 2.6 g/dL — ABNORMAL LOW (ref 3.5–5.0)
Alkaline Phosphatase: 146 U/L — ABNORMAL HIGH (ref 38–126)
Anion gap: 7 (ref 5–15)
BUN: 17 mg/dL (ref 8–23)
CO2: 25 mmol/L (ref 22–32)
Calcium: 8.6 mg/dL — ABNORMAL LOW (ref 8.9–10.3)
Chloride: 106 mmol/L (ref 98–111)
Creatinine, Ser: 0.98 mg/dL (ref 0.61–1.24)
GFR, Estimated: >60 mL/min (ref >60)
Glucose, Bld: 97 mg/dL (ref 70–99)
Potassium: 4.3 mmol/L (ref 3.5–5.1)
Sodium: 138 mmol/L (ref 135–145)
Total Bilirubin: 1.1 mg/dL (ref 0.3–1.2)
Total Protein: 5.5 g/dL — ABNORMAL LOW (ref 6.5–8.1)

## 2023-05-26 LAB — CBC
HCT: 39.2 % (ref 39.0–52.0)
Hemoglobin: 12.6 g/dL — ABNORMAL LOW (ref 13.0–17.0)
MCH: 30.7 pg (ref 26.0–34.0)
MCHC: 32.1 g/dL (ref 30.0–36.0)
MCV: 95.4 fL (ref 80.0–100.0)
Platelets: 216 10*3/uL (ref 150–400)
RBC: 4.11 MIL/uL — ABNORMAL LOW (ref 4.22–5.81)
RDW: 14.3 % (ref 11.5–15.5)
WBC: 7 10*3/uL (ref 4.0–10.5)
nRBC: 0 % (ref 0.0–0.2)

## 2023-05-26 LAB — GLUCOSE, CAPILLARY
Glucose-Capillary: 72 mg/dL (ref 70–99)
Glucose-Capillary: 87 mg/dL (ref 70–99)

## 2023-05-26 LAB — SURGICAL PATHOLOGY

## 2023-05-26 MED ORDER — ONDANSETRON 4 MG PO TBDP: 4.0000 mg | ORAL_TABLET | Freq: Four times a day (QID) | ORAL | 0 refills | Status: DC | PRN

## 2023-05-26 MED ORDER — POLYETHYLENE GLYCOL 3350 17 G PO PACK: 17.0000 g | PACK | Freq: Every day | ORAL | 0 refills | Status: DC | PRN

## 2023-05-26 MED ORDER — OXYCODONE HCL 5 MG PO TABS: 5.0000 mg | ORAL_TABLET | Freq: Four times a day (QID) | ORAL | 0 refills | Status: DC | PRN

## 2023-05-26 NOTE — Discharge Summary (Signed)
Physician Discharge Summary  VARDELL RODOCKER ZOX:[id number] DOB: 1956/09/21 DOA: 05/20/2023  PCP: Merri Brunette, MD  Admit date: 05/20/2023 Discharge date: 05/26/2023  Admitted From: Home Disposition: Home  Recommendations for Outpatient Follow-up:  Follow up with PCP in 1-2 weeks Please obtain BMP/CBC in one week Please follow up with general surgery Follow-up with interventional radiology  Home Health: None Equipment/Devices none  Discharge Condition: Stable CODE STATUS: Full code  Diet recommendation: Cardiac  Brief/Interim Summary:67 year old male with history of A-fib on Eliquis, hypothyroidism, obstructive sleep apnea on CPAP anxiety depression admitted with abdominal pain jaundice nausea without vomiting.  He was found to have acute cholecystitis by CT with possible choledocholithiasis.  MRCP with CBD and cystic duct stones.  GI and general surgery was consulted.  Discharge Diagnoses:  Principal Problem:   Acute calculous cholecystitis  #1 acute cholecystitis with choledocholithiasis s/p  ERCP 05/22/2023 Dr. Sol Passer bile duct was moderately dilated with a stone causing an obstruction, biliary sphincterotomy was performed, biliary tree was swept and sludge was found.  Status post lap chole with drain placement 05/23/2023 Dr. Gerrit Friends  He went into rapid A-fib RVR.  Was on Cardizem transiently when his heart rate dropped to the 40s and Cardizem was stopped.  Eliquis was restarted on 05/25/2023 total bilirubin improved to 1.4 from 8 on admission he was treated with Zosyn during the hospital stay.  He was asked to follow-up with general surgery for drain check.  #2 permanent atrial fibrillation -patient went into rapid A-fib.  He was monitored in stepdown unit.   #3 hypothyroidism on levothyroxine   #4 anxiety/depression on amitriptyline   #5 obstructive sleep apnea cpap    #6 hyponatremia resolved with IV fluids.     #7 hyperlipidemia on Crestor   #8 hypertension blood  pressure was soft during the hot stay.  He did not get Hyzaar which he used to take at home prior to admission.  So this was stopped at the time of discharge.  He will need to follow-up with PCP and address this to restart as an outpatient  Estimated body mass index is 31.84 kg/m as calculated from the following:   Height as of this encounter: 5\' 8"  (1.727 m).   Weight as of this encounter: 95 kg.  Discharge Instructions  Discharge Instructions     Diet - low sodium heart healthy   Complete by: As directed    Increase activity slowly   Complete by: As directed       Allergies as of 05/26/2023   No Known Allergies      Medication List     STOP taking these medications    losartan-hydrochlorothiazide 100-12.5 MG tablet Commonly known as: HYZAAR   meloxicam 15 MG tablet Commonly known as: MOBIC   methylPREDNISolone 4 MG Tbpk tablet Commonly known as: MEDROL DOSEPAK       TAKE these medications    amitriptyline 25 MG tablet Commonly known as: ELAVIL Take 25 mg by mouth at bedtime.   apixaban 5 MG Tabs tablet Commonly known as: Eliquis Take 1 tablet (5 mg total) by mouth 2 (two) times daily. OVERDUE for follow-up, MUST see PROVIDER for FUTURE refills. What changed: additional instructions   Cholecalciferol 25 MCG (1000 UT) capsule Take 1,000 Units by mouth daily.   furosemide 20 MG tablet Commonly known as: LASIX Take 1 tablet (20 mg total) by mouth as needed.   levothyroxine 100 MCG tablet Commonly known as: SYNTHROID Take 100 mcg by mouth daily before  breakfast.   multivitamin with minerals Tabs tablet Take 1 tablet by mouth daily.   ondansetron 4 MG disintegrating tablet Commonly known as: ZOFRAN-ODT Take 1 tablet (4 mg total) by mouth every 6 (six) hours as needed for nausea.   oxybutynin 5 MG 24 hr tablet Commonly known as: DITROPAN-XL Take 5 mg by mouth at bedtime.   oxyCODONE 5 MG immediate release tablet Commonly known as: Oxy  IR/ROXICODONE Take 1 tablet (5 mg total) by mouth every 6 (six) hours as needed for breakthrough pain.   polyethylene glycol 17 g packet Commonly known as: MIRALAX / GLYCOLAX Take 17 g by mouth daily as needed for mild constipation.   rosuvastatin 10 MG tablet Commonly known as: CRESTOR Take 10 mg by mouth daily.   zolpidem 10 MG tablet Commonly known as: AMBIEN Take 10 mg by mouth as needed for sleep.        Follow-up Information     [facility], Hedda Slade, [place]. Go on 06/10/2023.   Specialty: General Surgery Why: 06/10/23 at 10:15 am, Please arrive 30 minutes early to complete check in, and bring photo ID and insurance card. Contact information: [address] [facility] [place] [address] [phone]         [facility] Follow up on 05/29/2023.   Specialty: General Surgery Why: 845am. This is a drain check. Bring a copy of your photo ID and insurance card, arrive 30 minutes prior to your appointment Contact information: [address] [place] [address] [phone]         Merri Brunette, MD Follow up.   Specialty: Internal Medicine Contact information: [address] [place] [place] [address] [phone]                No Known Allergies  Consultations: GI, general surgery   Procedures/Studies: DG ERCP  Result Date: 05/23/2023 CLINICAL DATA:  Cholecystitis and choledocholithiasis EXAM: ERCP TECHNIQUE: Multiple spot images obtained with the fluoroscopic device and submitted for interpretation post-procedure. FLUOROSCOPY: Radiation Exposure Index (as provided by the fluoroscopic device): 17.55 mGy Kerma COMPARISON:  MRCP 05/21/2023 FINDINGS: A total of 7 intraoperative saved images are submitted for review. The images demonstrate a flexible duodenal scope in the descending duodenum with wire cannulation of the common bile duct. Subsequent cholangiography confirms patency of the  cystic duct with opacification of the gallbladder. IMPRESSION: 1. ERCP as above. 2. Contrast passes through the cystic duct and opacifies the gallbladder. These images were submitted for radiologic interpretation only. Please see the procedural report for the amount of contrast and the fluoroscopy time utilized. Electronically Signed   By: Malachy Moan M.D.   On: 05/23/2023 06:48   MR ABDOMEN WITH MRCP W CONTRAST  Result Date: 05/21/2023 CLINICAL DATA:  Right upper quadrant abdominal pain. Abnormal CT and ultrasound examinations. EXAM: MRI ABDOMEN WITHOUT AND WITH CONTRAST (INCLUDING MRCP) TECHNIQUE: Multiplanar multisequence MR imaging of the abdomen was performed both before and after the administration of intravenous contrast. Heavily T2-weighted images of the biliary and pancreatic ducts were obtained, and three-dimensional MRCP images were rendered by post processing. CONTRAST:  10mL GADAVIST GADOBUTROL 1 MMOL/ML IV SOLN COMPARISON:  CT scan 05/20/2023 and ultrasound 05/21/2023. FINDINGS: Lower chest: The lung bases are clear of an acute process. No pulmonary lesions or pleural effusions. No pericardial effusion. Hepatobiliary: No hepatic lesions or intrahepatic biliary dilatation. The gallbladder is distended, demonstrates numerous small dependently layering gallstones, has marked wall thickening and enhancement  and pericholecystic fluid consistent with acute cholecystitis. Normal caliber and course of the common bile duct. Small nonobstructing distal common bile duct stones. Cystic duct stones are also suspected. Pancreas: No mass, inflammation or ductal dilatation. Small amount of fluid surrounding the pancreas and in the right anterior pararenal space likely related to the gallbladder pathology. No findings suspicious for acute pancreatitis but recommend correlation with lipase and amylase levels. Spleen:  Normal size.  No focal lesions. Adrenals/Urinary Tract: Normal adrenal glands. Both kidneys  are normal. Stomach/Bowel: The stomach, duodenum, small bowel and colon are unremarkable. Vascular/Lymphatic: The aorta and branch vessels are patent. The major venous structures are patent. No mesenteric or retroperitoneal mass or adenopathy. Other: Right upper quadrant fluid and inflammation without overt ascites. Musculoskeletal: No significant bony findings. IMPRESSION: 1. MR findings consistent with acute cholecystitis. 2. Small nonobstructing distal common bile duct stones. Cystic duct stones are also suspected. 3. Small amount of fluid surrounding the pancreas and in the right anterior pararenal space likely related to the gallbladder pathology. No findings suspicious for acute pancreatitis but recommend correlation with lipase and amylase levels. Electronically Signed   By: Rudie Meyer M.D.   On: 05/21/2023 10:04   MR 3D Recon At Scanner  Result Date: 05/21/2023 CLINICAL DATA:  Right upper quadrant abdominal pain. Abnormal CT and ultrasound examinations. EXAM: MRI ABDOMEN WITHOUT AND WITH CONTRAST (INCLUDING MRCP) TECHNIQUE: Multiplanar multisequence MR imaging of the abdomen was performed both before and after the administration of intravenous contrast. Heavily T2-weighted images of the biliary and pancreatic ducts were obtained, and three-dimensional MRCP images were rendered by post processing. CONTRAST:  10mL GADAVIST GADOBUTROL 1 MMOL/ML IV SOLN COMPARISON:  CT scan 05/20/2023 and ultrasound 05/21/2023. FINDINGS: Lower chest: The lung bases are clear of an acute process. No pulmonary lesions or pleural effusions. No pericardial effusion. Hepatobiliary: No hepatic lesions or intrahepatic biliary dilatation. The gallbladder is distended, demonstrates numerous small dependently layering gallstones, has marked wall thickening and enhancement and pericholecystic fluid consistent with acute cholecystitis. Normal caliber and course of the common bile duct. Small nonobstructing distal common bile duct  stones. Cystic duct stones are also suspected. Pancreas: No mass, inflammation or ductal dilatation. Small amount of fluid surrounding the pancreas and in the right anterior pararenal space likely related to the gallbladder pathology. No findings suspicious for acute pancreatitis but recommend correlation with lipase and amylase levels. Spleen:  Normal size.  No focal lesions. Adrenals/Urinary Tract: Normal adrenal glands. Both kidneys are normal. Stomach/Bowel: The stomach, duodenum, small bowel and colon are unremarkable. Vascular/Lymphatic: The aorta and branch vessels are patent. The major venous structures are patent. No mesenteric or retroperitoneal mass or adenopathy. Other: Right upper quadrant fluid and inflammation without overt ascites. Musculoskeletal: No significant bony findings. IMPRESSION: 1. MR findings consistent with acute cholecystitis. 2. Small nonobstructing distal common bile duct stones. Cystic duct stones are also suspected. 3. Small amount of fluid surrounding the pancreas and in the right anterior pararenal space likely related to the gallbladder pathology. No findings suspicious for acute pancreatitis but recommend correlation with lipase and amylase levels. Electronically Signed   By: Rudie Meyer M.D.   On: 05/21/2023 10:04   US Abdomen Limited RUQ (LIVER/GB)  Result Date: 05/21/2023 CLINICAL DATA:  67 year old male with choledocholithiasis on CT, abnormal gallbladder. EXAM: ULTRASOUND ABDOMEN LIMITED RIGHT UPPER QUADRANT COMPARISON:  CT Abdomen and Pelvis yesterday. FINDINGS: Gallbladder: Abnormal gallbladder wall thickening up to 7 mm. Similar mildly distended appearance of the gallbladder. Echogenic sludge and  some shadowing stones within the gallbladder lumen. Stones measure up to 10 mm diameter. No sonographic Murphy sign elicited. Common bile duct: Diameter: 9-10 mm, abnormally dilated. No discrete stone identified within the duct on these images. But questionable sludge  within the duct (image 37). Liver: Mild intrahepatic biliary ductal dilatation appears stable. No discrete liver lesion. Background echogenicity within normal limits. Portal vein is patent on color Doppler imaging with normal direction of blood flow towards the liver. Other: Trace perihepatic free fluid (image 61). Negative visible right kidney. IMPRESSION: 1. Ongoing dilated CBD, gallbladder distension and abnormal wall thickening, and gallstones and sludge. Choledocholithiasis less well visualized by ultrasound when compared to the CT yesterday. But appearance otherwise not significantly changed. 2. Trace perihepatic free fluid now. Electronically Signed   By: Odessa Fleming M.D.   On: 05/21/2023 08:03   CT ABDOMEN PELVIS W CONTRAST  Result Date: 05/20/2023 CLINICAL DATA:  Abdominal pain, acute, nonlocalized EXAM: CT ABDOMEN AND PELVIS WITH CONTRAST TECHNIQUE: Multidetector CT imaging of the abdomen and pelvis was performed using the standard protocol following bolus administration of intravenous contrast. RADIATION DOSE REDUCTION: This exam was performed according to the departmental dose-optimization program which includes automated exposure control, adjustment of the mA and/or kV according to patient size and/or use of iterative reconstruction technique. CONTRAST:  OMNIPAQUE IOHEXOL 300 MG/ML  SOLN COMPARISON:  None Available. FINDINGS: Lower chest: No acute abnormality Hepatobiliary: Gallbladder is distended. Layering small stones within the gallbladder. There is gallbladder wall thickening and pericholecystic fluid. There is concern for possible small stones within the common bile duct. Mild intrahepatic biliary ductal dilatation. No focal hepatic abnormality. Pancreas: No focal abnormality or ductal dilatation. Spleen: No focal abnormality.  Normal size. Adrenals/Urinary Tract: No adrenal abnormality. No focal renal abnormality. No stones or hydronephrosis. Urinary bladder is unremarkable. Stomach/Bowel:  Stomach, Sigmoid diverticulosis. No active diverticulitis. Stomach and small bowel decompressed, unremarkable. Vascular/Lymphatic: Aortic atherosclerosis. No evidence of aneurysm or adenopathy. Reproductive: Prostate enlargement. Other: No free fluid or free air. Musculoskeletal: No acute bony abnormality. IMPRESSION: Gallbladder distension with gallbladder wall thickening, pericholecystic fluid and layering stones. Findings concerning for acute cholecystitis. There appears to be high density material within the common bile duct concerning for ductal stones. This could be further evaluated with right upper quadrant ultrasound if felt clinically indicated. Aortic atherosclerosis. Prostate enlargement. Electronically Signed   By: Charlett Nose M.D.   On: 05/20/2023 19:53   (Echo, Carotid, EGD, Colonoscopy, ERCP)    Subjective: Patient resting in bed anxious to go home  Discharge Exam: Vitals:   05/26/23 0639 05/26/23 1138  BP: 137/86 117/68  Pulse: 87 63  Resp: 17 17  Temp: 98.3 F (36.8 C) 97.8 F (36.6 C)  SpO2: (!) 84% 100%   Vitals:   05/25/23 1847 05/25/23 2129 05/26/23 0639 05/26/23 1138  BP: (!) 126/90 126/78 137/86 117/68  Pulse: 66 71 87 63  Resp: 20 17 17 17   Temp: 97.7 F (36.5 C) 98.4 F (36.9 C) 98.3 F (36.8 C) 97.8 F (36.6 C)  TempSrc: Oral Oral Oral Oral  SpO2: 100% 99% (!) 84% 100%  Weight:      Height:        General: Pt is alert, awake, not in acute distress Cardiovascular: RRR, S1/S2 +, no rubs, no gallops Respiratory: CTA bilaterally, no wheezing, no rhonchi Abdominal: Soft, NT, ND, bowel sounds + drain in place Extremities: no edema, no cyanosis    The results of significant diagnostics from this hospitalization (  including imaging, microbiology, ancillary and laboratory) are listed below for reference.     Microbiology: Recent Results (from the past 240 hour(s))  Blood culture (routine x 2)     Status: None   Collection Time: 05/20/23  7:25 PM    Specimen: BLOOD  Result Value Ref Range Status   Specimen Description   Final    BLOOD LEFT ANTECUBITAL Performed at [facility], [address] [place], [place] [address]    Special Requests   Final    BOTTLES DRAWN AEROBIC AND ANAEROBIC Blood Culture adequate volume Performed at [facility], [address] [place], [place] [address]    Culture   Final    NO GROWTH 5 DAYS Performed at [facility], [address], [place], [place] [address]    Report Status 05/25/2023 FINAL  Final  Blood culture (routine x 2)     Status: None   Collection Time: 05/20/23  7:30 PM   Specimen: BLOOD  Result Value Ref Range Status   Specimen Description   Final    BLOOD RIGHT ANTECUBITAL Performed at [facility], [address] [place], [place] [address]    Special Requests   Final    BOTTLES DRAWN AEROBIC AND ANAEROBIC Blood Culture adequate volume Performed at [facility], [address] [place], [place] [address]    Culture   Final    NO GROWTH 5 DAYS Performed at [facility], [address], [place], [place] [address]    Report Status 05/25/2023 FINAL  Final  MRSA Next Gen by PCR, Nasal     Status: None   Collection Time: 05/23/23 11:55 AM   Specimen: Nasal Mucosa; Nasal Swab  Result Value Ref Range Status   MRSA by PCR Next Gen NOT DETECTED NOT DETECTED Final    Comment: (NOTE) The GeneXpert MRSA Assay (FDA approved for NASAL specimens only), is one component of a comprehensive MRSA colonization surveillance program. It is not intended to diagnose MRSA infection nor to guide or monitor treatment for MRSA infections. Test performance is not FDA approved in patients less than 66 years old. Performed at [facility], [address], [place], [place] [address]      Labs: BNP (last 3 results) No results for input(s): "BNP" in the last 8760 hours. Basic Metabolic Panel: Recent Labs  Lab 05/21/23 0642  05/22/23 0702 05/23/23 0516 05/24/23 0526 05/25/23 0302 05/26/23 0418  NA 132* 134* 134* 134* 137 138  K 4.3 4.0 4.0 4.3 3.6 4.3  CL 97* 101 101 102 104 106  CO2 26 25 23 25 25 25   GLUCOSE 105* 99 147* 123* 113* 97  BUN 10 15 22 16 17 17   CREATININE 0.82 0.96 0.98 0.94 0.92 0.98  CALCIUM 8.8* 8.4* 8.6* 8.5* 8.3* 8.6*  MG 2.2  --   --   --   --   --   PHOS 3.8  --   --   --   --   --    Liver Function Tests: Recent Labs  Lab 05/22/23 0702 05/23/23 0516 05/24/23 0526 05/25/23 0302 05/26/23 0418  AST 104* 56* 47* 38 26  ALT 138* 102* 82* 67* 55*  ALKPHOS 274* 283* 202* 183* 146*  BILITOT 8.1* 2.8* 1.5* 1.4* 1.1  PROT 6.5 6.6 6.1* 5.5* 5.5*  ALBUMIN 3.1* 3.1* 2.8* 2.6* 2.6*   Recent Labs  Lab 05/20/23 1759  LIPASE 32   No results for input(s): "AMMONIA" in the last 168 hours. CBC: Recent Labs  Lab 05/20/23 1759 05/21/23 1610 05/22/23 0520 05/23/23 0516 05/24/23 0526 05/25/23 0302 05/26/23 0418  WBC 10.2   < > 7.1 5.3 12.6* 6.9 7.0  NEUTROABS 8.9*  --   --   --   --   --   --   HGB 15.2   < > 13.7 13.9 12.7* 12.1* 12.6*  HCT 45.1   < > 40.8 42.9 39.4 38.0* 39.2  MCV 90.2   < > 90.9 93.1 92.5 93.8 95.4  PLT 262   < > 229 215 258 201 216   < > = values in this interval not displayed.   Cardiac Enzymes: No results for input(s): "CKTOTAL", "CKMB", "CKMBINDEX", "TROPONINI" in the last 168 hours. BNP: Invalid input(s): "POCBNP" CBG: Recent Labs  Lab 05/25/23 1149 05/25/23 1547 05/25/23 2133 05/26/23 0725 05/26/23 1135  GLUCAP 83 118* 98 72 87   D-Dimer No results for input(s): "DDIMER" in the last 72 hours. Hgb A1c No results for input(s): "HGBA1C" in the last 72 hours. Lipid Profile No results for input(s): "CHOL", "HDL", "LDLCALC", "TRIG", "CHOLHDL", "LDLDIRECT" in the last 72 hours. Thyroid function studies No results for input(s): "TSH", "T4TOTAL", "T3FREE", "THYROIDAB" in the last 72 hours.  Invalid input(s): "FREET3" Anemia work up No  results for input(s): "VITAMINB12", "FOLATE", "FERRITIN", "TIBC", "IRON", "RETICCTPCT" in the last 72 hours. Urinalysis    Component Value Date/Time   COLORURINE YELLOW 05/20/2023 1948   APPEARANCEUR CLEAR 05/20/2023 1948   LABSPEC 1.020 05/20/2023 1948   PHURINE 7.0 05/20/2023 1948   GLUCOSEU NEGATIVE 05/20/2023 1948   HGBUR TRACE (A) 05/20/2023 1948   BILIRUBINUR NEGATIVE 05/20/2023 1948   KETONESUR NEGATIVE 05/20/2023 1948   PROTEINUR NEGATIVE 05/20/2023 1948   NITRITE NEGATIVE 05/20/2023 1948   LEUKOCYTESUR NEGATIVE 05/20/2023 1948   Sepsis Labs Recent Labs  Lab 05/23/23 0516 05/24/23 0526 05/25/23 0302 05/26/23 0418  WBC 5.3 12.6* 6.9 7.0   Microbiology Recent Results (from the past 240 hour(s))  Blood culture (routine x 2)     Status: None   Collection Time: 05/20/23  7:25 PM   Specimen: BLOOD  Result Value Ref Range Status   Specimen Description   Final    BLOOD LEFT ANTECUBITAL Performed at [facility], [address] [place], [place] [address]    Special Requests   Final    BOTTLES DRAWN AEROBIC AND ANAEROBIC Blood Culture adequate volume Performed at [facility], [address] [place], [place] [address]    Culture   Final    NO GROWTH 5 DAYS Performed at [facility], [address], [place], [place] [address]    Report Status 05/25/2023 FINAL  Final  Blood culture (routine x 2)     Status: None   Collection Time: 05/20/23  7:30 PM   Specimen: BLOOD  Result Value Ref Range Status   Specimen Description   Final    BLOOD RIGHT ANTECUBITAL Performed at [facility], [address] [place], [place] [address]    Special Requests   Final    BOTTLES DRAWN AEROBIC AND ANAEROBIC Blood Culture adequate volume Performed at [facility], [address] [place], [place] [address]    Culture   Final    NO GROWTH 5 DAYS Performed at [facility], [address] [place], [place] [address]  Report  Status 05/25/2023 FINAL  Final  MRSA Next Gen by PCR, Nasal     Status: None   Collection Time: 05/23/23 11:55 AM   Specimen: Nasal Mucosa; Nasal Swab  Result Value Ref Range Status   MRSA by PCR Next Gen NOT DETECTED NOT DETECTED Final    Comment: (NOTE) The GeneXpert MRSA Assay (FDA approved for NASAL specimens only), is one component of a comprehensive MRSA colonization surveillance program. It is not intended to diagnose MRSA infection nor to guide or monitor treatment for MRSA infections. Test performance is not FDA approved in patients less than 101 years old. Performed at [facility], [address], [place], [place] [address]      Time coordinating discharge:  38 minutes  SIGNED: Alwyn Ren, MD  [facility] 05/26/2023, 3:50 PM

## 2023-05-26 NOTE — Progress Notes (Addendum)
3 Days Post-Op  Subjective: CC: Doing well. Some incisional pain only with movement. Pain well controlled. Tolerating reg diet without worsening of his abdominal pain or n/v. Voiding. Passing flatus. Mobilizing.   Objective: Vital signs in last 24 hours: Temp:  [97.4 F (36.3 C)-98.4 F (36.9 C)] 98.3 F (36.8 C) (06/17 0639) Pulse Rate:  [37-187] 87 (06/17 0639) Resp:  [11-25] 17 (06/17 0639) BP: (123-137)/(78-90) 137/86 (06/17 0639) SpO2:  [81 %-100 %] 84 % (06/17 0639) FiO2 (%):  [21 %] 21 % (06/16 2309) Last BM Date : 05/23/23  Intake/Output from previous day: 06/16 0701 - 06/17 0700 In: 892.7 [P.O.:720; I.V.:32; IV Piggyback:140.6] Out: 1225 [Urine:1125; Drains:100] Intake/Output this shift: Total I/O In: 240 [P.O.:240] Out: 535 [Urine:500; Drains:35]  PE: Gen:  Alert, NAD, pleasant Pulm:  Rate and effort normal Abd: Soft, no distension, appropriately tender around laparoscopic incisions, no rigidity or guarding and otherwise NT, +BS. Incisions with glue intact appears well and are without drainage, bleeding, or signs of infection. Drain looks SS today.  Psych: A&Ox3   Lab Results:  Recent Labs    05/25/23 0302 05/26/23 0418  WBC 6.9 7.0  HGB 12.1* 12.6*  HCT 38.0* 39.2  PLT 201 216   BMET Recent Labs    05/25/23 0302 05/26/23 0418  NA 137 138  K 3.6 4.3  CL 104 106  CO2 25 25  GLUCOSE 113* 97  BUN 17 17  CREATININE 0.92 0.98  CALCIUM 8.3* 8.6*   PT/INR No results for input(s): "LABPROT", "INR" in the last 72 hours. CMP     Component Value Date/Time   NA 138 05/26/2023 0418   K 4.3 05/26/2023 0418   CL 106 05/26/2023 0418   CO2 25 05/26/2023 0418   GLUCOSE 97 05/26/2023 0418   BUN 17 05/26/2023 0418   CREATININE 0.98 05/26/2023 0418   CALCIUM 8.6 (L) 05/26/2023 0418   PROT 5.5 (L) 05/26/2023 0418   ALBUMIN 2.6 (L) 05/26/2023 0418   AST 26 05/26/2023 0418   ALT 55 (H) 05/26/2023 0418   ALKPHOS 146 (H) 05/26/2023 0418   BILITOT 1.1  05/26/2023 0418   GFRNONAA >60 05/26/2023 0418   Lipase     Component Value Date/Time   LIPASE 32 05/20/2023 1759    Studies/Results: No results found.  Anti-infectives: Anti-infectives (From admission, onward)    Start     Dose/Rate Route Frequency Ordered Stop   05/21/23 0600  piperacillin-tazobactam (ZOSYN) IVPB 3.375 g        3.375 g 12.5 mL/hr over 240 Minutes Intravenous Every 8 hours 05/21/23 0112     05/20/23 2015  piperacillin-tazobactam (ZOSYN) IVPB 3.375 g        3.375 g 100 mL/hr over 30 Minutes Intravenous  Once 05/20/23 2013 05/20/23 2118        Assessment/Plan POD 3 s/p Laparoscopic Cholecystectomy for acute cholecystitis, cholelithiasis, history of choledocholithiasis by Dr.Gerkin 6/14 - S/p ERCP w/ sphincterotomy 6/13 - Afebrile. WBC wnl. LFT's downtrending. Still on abx. Will confirm with MD when we can d/c.  - Cont JP. Drain previously bilious. Looks more SS today. Dr. Gerrit Friends did use snow during the time of surgery. Output downtrending. Can hold on HIDA. Will discuss with MD about plan.  - Mobilize - Pulm toilet  Addendum: Discussed with MD. Molli Knock for d/c from our standpoint. Drain check this Thursday. No further abx needed. I went back and discussed with the patient. Drain still appears SS. Discussed discharge instructions, restrictions  and return/call back precautions.   FEN - Reg. IVF per primary  VTE - SCDs, eliquis already restarted.   ID - On Zosyn. See above.   Hypothyroidism  OSA HTN HLD A. Fib - off cardizem gtt.     LOS: 5 days    Jacinto Halim , [facility] 05/26/2023, 9:41 AM Please see Amion for pager number during day hours 7:00am-4:30pm

## 2023-05-30 DIAGNOSIS — G4733 Obstructive sleep apnea (adult) (pediatric): Secondary | ICD-10-CM | POA: Diagnosis not present

## 2023-06-04 DIAGNOSIS — I4891 Unspecified atrial fibrillation: Secondary | ICD-10-CM | POA: Diagnosis not present

## 2023-06-04 DIAGNOSIS — Z125 Encounter for screening for malignant neoplasm of prostate: Secondary | ICD-10-CM | POA: Diagnosis not present

## 2023-06-04 DIAGNOSIS — E039 Hypothyroidism, unspecified: Secondary | ICD-10-CM | POA: Diagnosis not present

## 2023-06-04 DIAGNOSIS — I5032 Chronic diastolic (congestive) heart failure: Secondary | ICD-10-CM | POA: Diagnosis not present

## 2023-06-04 DIAGNOSIS — Z9889 Other specified postprocedural states: Secondary | ICD-10-CM | POA: Diagnosis not present

## 2023-06-04 DIAGNOSIS — I10 Essential (primary) hypertension: Secondary | ICD-10-CM | POA: Diagnosis not present

## 2023-06-04 DIAGNOSIS — Z09 Encounter for follow-up examination after completed treatment for conditions other than malignant neoplasm: Secondary | ICD-10-CM | POA: Diagnosis not present

## 2023-06-06 DIAGNOSIS — G4733 Obstructive sleep apnea (adult) (pediatric): Secondary | ICD-10-CM | POA: Diagnosis not present

## 2023-06-10 DIAGNOSIS — K219 Gastro-esophageal reflux disease without esophagitis: Secondary | ICD-10-CM | POA: Diagnosis not present

## 2023-06-10 DIAGNOSIS — E039 Hypothyroidism, unspecified: Secondary | ICD-10-CM | POA: Diagnosis not present

## 2023-06-10 DIAGNOSIS — N3281 Overactive bladder: Secondary | ICD-10-CM | POA: Diagnosis not present

## 2023-06-10 DIAGNOSIS — I10 Essential (primary) hypertension: Secondary | ICD-10-CM | POA: Diagnosis not present

## 2023-06-10 DIAGNOSIS — Z0000 Encounter for general adult medical examination without abnormal findings: Secondary | ICD-10-CM | POA: Diagnosis not present

## 2023-06-10 DIAGNOSIS — B353 Tinea pedis: Secondary | ICD-10-CM | POA: Diagnosis not present

## 2023-06-10 DIAGNOSIS — I4891 Unspecified atrial fibrillation: Secondary | ICD-10-CM | POA: Diagnosis not present

## 2023-06-10 DIAGNOSIS — I700 Atherosclerosis of aorta: Secondary | ICD-10-CM | POA: Diagnosis not present

## 2023-06-10 DIAGNOSIS — D689 Coagulation defect, unspecified: Secondary | ICD-10-CM | POA: Diagnosis not present

## 2023-06-10 DIAGNOSIS — R200 Anesthesia of skin: Secondary | ICD-10-CM | POA: Diagnosis not present

## 2023-06-10 DIAGNOSIS — M1990 Unspecified osteoarthritis, unspecified site: Secondary | ICD-10-CM | POA: Diagnosis not present

## 2023-06-10 DIAGNOSIS — F5101 Primary insomnia: Secondary | ICD-10-CM | POA: Diagnosis not present

## 2023-06-11 NOTE — Addendum Note (Signed)
Addendum  created 06/11/23 0725 by Val Eagle, MD   [facility]raprocedure Staff edited

## 2023-06-16 DIAGNOSIS — N35813 Other membranous urethral stricture, male: Secondary | ICD-10-CM | POA: Diagnosis not present

## 2023-06-18 ENCOUNTER — Ambulatory Visit: Payer: PPO | Admitting: Nurse Practitioner

## 2023-07-03 DIAGNOSIS — M545 Low back pain, unspecified: Secondary | ICD-10-CM | POA: Diagnosis not present

## 2023-07-03 DIAGNOSIS — M5416 Radiculopathy, lumbar region: Secondary | ICD-10-CM | POA: Diagnosis not present

## 2023-07-04 ENCOUNTER — Ambulatory Visit (HOSPITAL_COMMUNITY): Payer: PPO | Attending: Cardiovascular Disease

## 2023-07-04 DIAGNOSIS — I4891 Unspecified atrial fibrillation: Secondary | ICD-10-CM | POA: Diagnosis not present

## 2023-07-04 LAB — ECHOCARDIOGRAM COMPLETE
MV M vel: 4.72 m/s
MV Peak grad: 89.1 mmHg
S' Lateral: 2.9 cm

## 2023-07-07 ENCOUNTER — Ambulatory Visit: Payer: PPO | Admitting: Adult Health

## 2023-07-07 ENCOUNTER — Encounter: Payer: Self-pay | Admitting: Adult Health

## 2023-07-07 ENCOUNTER — Encounter: Payer: Self-pay | Admitting: *Deleted

## 2023-07-07 VITALS — BP 124/78 | HR 64 | Wt 223.4 lb

## 2023-07-07 DIAGNOSIS — G4733 Obstructive sleep apnea (adult) (pediatric): Secondary | ICD-10-CM | POA: Diagnosis not present

## 2023-07-07 NOTE — Progress Notes (Signed)
@Patient  ID: Roberto Sanford, male    DOB: [birth year], 67 y.o.   MRN: [id number]  Chief Complaint  Patient presents with   Follow-up    Referring provider: Merri Brunette, MD  HPI: 67 year old male followed for obstructive sleep apnea  TEST/EVENTS :  HRCT on 05/15/2021 showed no evidence of fibrotic interstitial lung disease. Minimal air trapping indicative of small airway disease.   05/11/21 PFT - FVC 3.37 (78%), FEV1 2.89 (89%), ratio 86, TLC 91%, DLCOunc 137%   05/11/21 FENO 24  07/07/2023 Follow up : OSA  Patient presents for a 1 year follow-up.  Patient has underlying sleep apnea is on CPAP at bedtime.  Patient says he is doing well on CPAP.  Wears a CPAP every single night.  He feels he benefits from CPAP with decreased daytime sleepiness.  CPAP download shows excellent compliance with 100% usage.  Daily average usage at 8 hours.  Patient is on auto CPAP 5 to 15 cm H2O.  Daily average pressure at 9.5 cm H2O.  AHI 8.1/hour.  Uses nasal mask. Christoper Allegra is DME.  Takes Ambien couple of night a week as needed. Not taking any pain meds. Did have cholecystectomy last month . Did well with surgery.   No Known Allergies  Immunization History  Administered Date(s) Administered   Hepatitis A, Adult 07/21/2017, 02/21/2018   PFIZER(Purple Top)SARS-COV-2 Vaccination 03/31/2020, 04/21/2020, 01/10/2021   PNEUMOCOCCAL CONJUGATE-20 06/04/2022   Tdap 04/06/2008, 05/12/2018   Zoster Recombinant(Shingrix) 07/21/2017, 09/27/2017    Past Medical History:  Diagnosis Date   Anxiety    Arrhythmia    Arthritis    Atrial fibrillation (HCC)    GERD (gastroesophageal reflux disease)    Hypothyroidism    Overactive bladder    takes vesicare and elavil   Sleep apnea    uses CPAP nightly    Tobacco History: Social History   Tobacco Use  Smoking Status Never  Smokeless Tobacco Never   Counseling given: Not Answered   Outpatient Medications Prior to Visit  Medication Sig Dispense Refill    amitriptyline (ELAVIL) 25 MG tablet Take 25 mg by mouth at bedtime.     apixaban (ELIQUIS) 5 MG TABS tablet Take 1 tablet (5 mg total) by mouth 2 (two) times daily. OVERDUE for follow-up, MUST see PROVIDER for FUTURE refills. (Patient taking differently: Take 5 mg by mouth 2 (two) times daily.) 60 tablet 2   levothyroxine (SYNTHROID) 100 MCG tablet Take 100 mcg by mouth daily before breakfast.     losartan-hydrochlorothiazide (HYZAAR) 100-12.5 MG tablet Take 1 tablet by mouth daily.     Multiple Vitamin (MULTIVITAMIN WITH MINERALS) TABS tablet Take 1 tablet by mouth daily.     ondansetron (ZOFRAN-ODT) 4 MG disintegrating tablet Take 1 tablet (4 mg total) by mouth every 6 (six) hours as needed for nausea. 20 tablet 0   oxybutynin (DITROPAN-XL) 5 MG 24 hr tablet Take 5 mg by mouth at bedtime.     oxyCODONE (OXY IR/ROXICODONE) 5 MG immediate release tablet Take 1 tablet (5 mg total) by mouth every 6 (six) hours as needed for breakthrough pain. 15 tablet 0   pantoprazole (PROTONIX) 40 MG tablet Take 40 mg by mouth daily.     polyethylene glycol (MIRALAX / GLYCOLAX) 17 g packet Take 17 g by mouth daily as needed for mild constipation. 14 each 0   rosuvastatin (CRESTOR) 10 MG tablet Take 10 mg by mouth daily.     zolpidem (AMBIEN) 10 MG tablet Take 10 mg  by mouth as needed for sleep.     Cholecalciferol 25 MCG (1000 UT) capsule Take 1,000 Units by mouth daily. (Patient not taking: Reported on 07/07/2023)     furosemide (LASIX) 20 MG tablet Take 1 tablet (20 mg total) by mouth as needed. 90 tablet 3   No facility-administered medications prior to visit.     Review of Systems:   Constitutional:   No  weight loss, night sweats,  Fevers, chills, fatigue, or  lassitude.  HEENT:   No headaches,  Difficulty swallowing,  Tooth/dental problems, or  Sore throat,                No sneezing, itching, ear ache, nasal congestion, post nasal drip,   CV:  No chest pain,  Orthopnea, PND, swelling in lower  extremities, anasarca, dizziness, palpitations, syncope.   GI  No heartburn, indigestion, abdominal pain, nausea, vomiting, diarrhea, change in bowel habits, loss of appetite, bloody stools.   Resp: No shortness of breath with exertion or at rest.  No excess mucus, no productive cough,  No non-productive cough,  No coughing up of blood.  No change in color of mucus.  No wheezing.  No chest wall deformity  Skin: no rash or lesions.  GU: no dysuria, change in color of urine, no urgency or frequency.  No flank pain, no hematuria   MS:  No joint pain or swelling.  No decreased range of motion.  No back pain.    Physical Exam  BP 124/78   Pulse 64   Wt 223 lb 6.4 oz (101.3 kg)   SpO2 100%   BMI 33.97 kg/m   GEN: A/Ox3; pleasant , NAD, well nourished    HEENT:  [place]/AT,   NOSE-clear, THROAT-clear, no lesions, no postnasal drip or exudate noted. Class 3 MP airway   NECK:  Supple w/ fair ROM; no JVD; normal carotid impulses w/o bruits; no thyromegaly or nodules palpated; no lymphadenopathy.    RESP  Clear  P & A; w/o, wheezes/ rales/ or rhonchi. no accessory muscle use, no dullness to percussion  CARD:  RRR, no m/r/g, no peripheral edema, pulses intact, no cyanosis or clubbing.  GI:   Soft & nt; nml bowel sounds; no organomegaly or masses detected.   Musco: Warm bil, no deformities or joint swelling noted.   Neuro: alert, no focal deficits noted.    Skin: Warm, no lesions or rashes    Lab Results:  CBC    Component Value Date/Time   WBC 7.0 05/26/2023 0418   RBC 4.11 (L) 05/26/2023 0418   HGB 12.6 (L) 05/26/2023 0418   HCT 39.2 05/26/2023 0418   PLT 216 05/26/2023 0418   MCV 95.4 05/26/2023 0418   MCH 30.7 05/26/2023 0418   MCHC 32.1 05/26/2023 0418   RDW 14.3 05/26/2023 0418   LYMPHSABS 0.5 (L) 05/20/2023 1759   MONOABS 0.8 05/20/2023 1759   EOSABS 0.0 05/20/2023 1759   BASOSABS 0.0 05/20/2023 1759    BMET    Component Value Date/Time   NA 138 05/26/2023 0418    K 4.3 05/26/2023 0418   CL 106 05/26/2023 0418   CO2 25 05/26/2023 0418   GLUCOSE 97 05/26/2023 0418   BUN 17 05/26/2023 0418   CREATININE 0.98 05/26/2023 0418   CALCIUM 8.6 (L) 05/26/2023 0418   GFRNONAA >60 05/26/2023 0418    BNP No results found for: "BNP"  ProBNP    Component Value Date/Time   PROBNP 1,001 (H) 07/13/2021 3244  Imaging: ECHOCARDIOGRAM COMPLETE  Result Date: 07/04/2023    ECHOCARDIOGRAM REPORT   Patient Name:   UGO BOURN Date of Exam: 07/04/2023 Medical Rec #:  [id number]      Height:       68.0 in Accession #:    [id number]     Weight:       209.4 lb Date of Birth:  1956/10/05      BSA:          2.084 m Patient Age:    66 years       BP:           117/68 mmHg Patient Gender: M              HR:           83 bpm. Exam Location:  [address] Procedure: 2D Echo, Color Doppler, Cardiac Doppler and 3D Echo Indications:    Atrial Fibrillation I48.91  History:        Patient has prior history of Echocardiogram examinations, most                 recent 04/02/2021. Arrythmias:Atrial Fibrillation.  Sonographer:    Thurman Coyer RDCS Referring Phys: [id number] [facility] IMPRESSIONS  1. Left ventricular ejection fraction, by estimation, is 55 to 60%. The left ventricle has normal function. The left ventricle has no regional wall motion abnormalities. Left ventricular diastolic function could not be evaluated.  2. Right ventricular systolic function is normal. The right ventricular size is normal. There is mildly elevated pulmonary artery systolic pressure.  3. Left atrial size was severely dilated.  4. Right atrial size was mildly dilated.  5. The mitral valve is normal in structure. Trivial mitral valve regurgitation. No evidence of mitral stenosis.  6. The aortic valve is tricuspid. Aortic valve regurgitation is not visualized. No aortic stenosis is present.  7. Aortic dilatation noted. There is mild dilatation of the ascending aorta, measuring 42 mm.  8. The  inferior vena cava is dilated in size with <50% respiratory variability, suggesting right atrial pressure of 15 mmHg. FINDINGS  Left Ventricle: Left ventricular ejection fraction, by estimation, is 55 to 60%. The left ventricle has normal function. The left ventricle has no regional wall motion abnormalities. The left ventricular internal cavity size was normal in size. There is  no left ventricular hypertrophy. Left ventricular diastolic function could not be evaluated due to atrial fibrillation. Left ventricular diastolic function could not be evaluated. Right Ventricle: The right ventricular size is normal. No increase in right ventricular wall thickness. Right ventricular systolic function is normal. There is mildly elevated pulmonary artery systolic pressure. The tricuspid regurgitant velocity is 2.51  m/s, and with an assumed right atrial pressure of 15 mmHg, the estimated right ventricular systolic pressure is 40.2 mmHg. Left Atrium: Left atrial size was severely dilated. Right Atrium: Right atrial size was mildly dilated. Pericardium: There is no evidence of pericardial effusion. Mitral Valve: The mitral valve is normal in structure. Trivial mitral valve regurgitation. No evidence of mitral valve stenosis. Tricuspid Valve: The tricuspid valve is normal in structure. Tricuspid valve regurgitation is mild . No evidence of tricuspid stenosis. Aortic Valve: The aortic valve is tricuspid. Aortic valve regurgitation is not visualized. No aortic stenosis is present. Pulmonic Valve: The pulmonic valve was normal in structure. Pulmonic valve regurgitation is not visualized. No evidence of pulmonic stenosis. Aorta: Aortic dilatation noted. There is mild dilatation of the ascending aorta, measuring 42 mm. Venous:  The inferior vena cava is dilated in size with less than 50% respiratory variability, suggesting right atrial pressure of 15 mmHg. IAS/Shunts: No atrial level shunt detected by color flow Doppler.  LEFT  VENTRICLE PLAX 2D LVIDd:         4.30 cm LVIDs:         2.90 cm LV PW:         1.00 cm LV IVS:        0.80 cm LVOT diam:     2.20 cm   3D Volume EF: LV SV:         51        3D EF:        52 % LV SV Index:   24        LV EDV:       155 ml LVOT Area:     3.80 cm  LV ESV:       75 ml                          LV SV:        81 ml RIGHT VENTRICLE RV Basal diam:  4.10 cm RV Mid diam:    2.50 cm RV S prime:     12.90 cm/s TAPSE (M-mode): 2.5 cm LEFT ATRIUM              Index        RIGHT ATRIUM           Index LA diam:        4.70 cm  2.26 cm/m   RA Area:     24.50 cm LA Vol (A2C):   121.0 ml 58.06 ml/m  RA Volume:   67.70 ml  32.48 ml/m LA Vol (A4C):   78.5 ml  37.66 ml/m LA Biplane Vol: 96.2 ml  46.16 ml/m  AORTIC VALVE LVOT Vmax:   66.50 cm/s LVOT Vmean:  44.900 cm/s LVOT VTI:    0.134 m  AORTA Ao Root diam: 3.30 cm Ao Asc diam:  4.20 cm MR Peak grad: 89.1 mmHg   TRICUSPID VALVE MR Mean grad: 56.0 mmHg   TR Peak grad:   25.2 mmHg MR Vmax:      472.00 cm/s TR Vmax:        251.00 cm/s MR Vmean:     346.0 cm/s                           SHUNTS                           Systemic VTI:  0.13 m                           Systemic Diam: 2.20 cm Chilton Si MD Electronically signed by Chilton Si MD Signature Date/Time: 07/04/2023/12:23:55 PM    Final     Administration History     None          Latest Ref Rng & Units 05/11/2021    1:04 PM  PFT Results  FVC-Pre L 3.41   FVC-Predicted Pre % 79   FVC-Post L 3.37   FVC-Predicted Post % 78   Pre FEV1/FVC % % 81   Post FEV1/FCV % % 86   FEV1-Pre L 2.78   FEV1-Predicted Pre % 86  FEV1-Post L 2.89   DLCO uncorrected ml/min/mmHg 34.88   DLCO UNC% % 137   DLCO corrected ml/min/mmHg 34.88   DLCO COR %Predicted % 137   DLVA Predicted % 139   TLC L 6.04   TLC % Predicted % 91   RV % Predicted % 103     Lab Results  Component Value Date   NITRICOXIDE 24 05/11/2021        Assessment & Plan:   OSA (obstructive sleep apnea) Excellent  compliance on CPAP.  Patient has a few residual events mainly central.  Will adjust CPAP pressure to see if we can get number of residual events down.  Adjust CPAP AutoSet to 5 to 10 cm H2O. Will check a download in 1 month  Plan  Patient Instructions  Decrease CPAP pressure-CPAP auto 5 to 10 cm H2O. CPAP download in 1 month.  Continue on CPAP at bedtime Work on healthy weight Keep up the good work Follow-up in 1 year Dr. Wynona Neat or Clent Ridges NP and as needed        Rubye Oaks, NP 07/07/2023

## 2023-07-07 NOTE — Patient Instructions (Addendum)
Decrease CPAP pressure-CPAP auto 5 to 10 cm H2O. CPAP download in 1 month.  Continue on CPAP at bedtime Work on healthy weight Keep up the good work Follow-up in 1 year Dr. Wynona Neat or Jhane Lorio NP and as needed

## 2023-07-07 NOTE — Assessment & Plan Note (Signed)
Excellent compliance on CPAP.  Patient has a few residual events mainly central.  Will adjust CPAP pressure to see if we can get number of residual events down.  Adjust CPAP AutoSet to 5 to 10 cm H2O. Will check a download in 1 month  Plan  Patient Instructions  Decrease CPAP pressure-CPAP auto 5 to 10 cm H2O. CPAP download in 1 month.  Continue on CPAP at bedtime Work on healthy weight Keep up the good work Follow-up in 1 year Dr. Wynona Neat or Elisa Sorlie NP and as needed

## 2023-07-08 NOTE — Progress Notes (Signed)
Reviewed and agree with assessment/plan.   Coralyn Helling, MD [facility] Pulmonary/Critical Care 07/08/2023, 10:53 AM Pager:  [phone]

## 2023-07-10 DIAGNOSIS — M5416 Radiculopathy, lumbar region: Secondary | ICD-10-CM | POA: Diagnosis not present

## 2023-07-15 DIAGNOSIS — M5416 Radiculopathy, lumbar region: Secondary | ICD-10-CM | POA: Diagnosis not present

## 2023-07-15 DIAGNOSIS — R748 Abnormal levels of other serum enzymes: Secondary | ICD-10-CM | POA: Diagnosis not present

## 2023-07-15 DIAGNOSIS — E039 Hypothyroidism, unspecified: Secondary | ICD-10-CM | POA: Diagnosis not present

## 2023-07-17 DIAGNOSIS — M5416 Radiculopathy, lumbar region: Secondary | ICD-10-CM | POA: Diagnosis not present

## 2023-07-17 NOTE — Progress Notes (Signed)
Cardiology Office Note:   Date:  07/18/2023  NAME:  Roberto Sanford    MRN: [id number] DOB:  12-11-1955   PCP:  Merri Brunette, MD  Cardiologist:  None  Electrophysiologist:  None   Referring MD: Merri Brunette, MD   Chief Complaint  Patient presents with   Follow-up         History of Present Illness:   Roberto Sanford is a 67 y.o. male with a hx of persistent Afib, HFpEF, HLD, obesity, OSA who presents for follow-up.  He reports no chest pains or trouble breathing.  Remains in A-fib but no symptoms.  BP slightly elevated but has been well-controlled at home.  He reports he is exercising going to the gym.  No limitations.  He is no longer taking Lasix as this was making him lightheaded.  He does notice some minor bruising but no significant bleeding on Eliquis.  Kidney function is stable.  Lipids are at goal.  Overall euvolemic on examination and doing well.  Given his lack of symptoms with A-fib we have opted for a rate control strategy.  Problem List Afib, persistent -CHADSVASC=3 (dCHF, HTN, age) -no sx; rate control  OSA Coronary calcifications on CT -negative MPI HFpEF HLD -T chol 155, LDL 88, HDL 45, TG 155 6. HTN 7. Obesity -BMI 34 8. OSA  Past Medical History: Past Medical History:  Diagnosis Date   Anxiety    Arrhythmia    Arthritis    Atrial fibrillation (HCC)    CHF (congestive heart failure) (HCC)    GERD (gastroesophageal reflux disease)    Hypertension    Hypothyroidism    Overactive bladder    takes vesicare and elavil   Sleep apnea    uses CPAP nightly    Past Surgical History: Past Surgical History:  Procedure Laterality Date   CARPAL TUNNEL RELEASE Right 04/27/2015   Procedure: CARPAL TUNNEL RELEASE RIGHT ;  Surgeon: Cindee Salt, MD;  Location: [facility];  Service: Orthopedics;  Laterality: Right;   CARPAL TUNNEL RELEASE Left 05/18/2015   Procedure: LEFT CARPAL TUNNEL RELEASE;  Surgeon: Cindee Salt, MD;  Location: [facility];  Service: Orthopedics;  Laterality: Left;   CHOLECYSTECTOMY N/A 05/23/2023   Procedure: LAPAROSCOPIC CHOLECYSTECTOMY;  Surgeon: Darnell Level, MD;  Location: [facility];  Service: General;  Laterality: N/A;   ERCP N/A 05/22/2023   Procedure: ENDOSCOPIC RETROGRADE CHOLANGIOPANCREATOGRAPHY (ERCP);  Surgeon: Jeani Hawking, MD;  Location: [facility];  Service: Gastroenterology;  Laterality: N/A;   HERNIA REPAIR     KNEE ARTHROSCOPY Left    REMOVAL OF STONES  05/22/2023   Procedure: REMOVAL OF STONES;  Surgeon: Jeani Hawking, MD;  Location: [facility];  Service: Gastroenterology;;   Dennison Mascot  05/22/2023   Procedure: Dennison Mascot;  Surgeon: Jeani Hawking, MD;  Location: [facility];  Service: Gastroenterology;;   TRIGGER FINGER RELEASE Right 04/27/2015   Procedure: RELEASE A-1 PULLEY PULLEY RIGHT MIDDLE FINGER, RELEASE A-1 PULLEY RIGHT RING FINGER ;  Surgeon: Cindee Salt, MD;  Location: [facility];  Service: Orthopedics;  Laterality: Right;    Current Medications: Current Meds  Medication Sig   amitriptyline (ELAVIL) 25 MG tablet Take 25 mg by mouth at bedtime.   apixaban (ELIQUIS) 5 MG TABS tablet Take 1 tablet (5 mg total) by mouth 2 (two) times daily. OVERDUE for follow-up, MUST see PROVIDER for FUTURE refills. (Patient taking differently: Take 5 mg by mouth 2 (two) times daily.)   Cholecalciferol 25 MCG (  1000 UT) capsule Take 1,000 Units by mouth daily.   levothyroxine (SYNTHROID) 100 MCG tablet Take 100 mcg by mouth daily before breakfast.   losartan-hydrochlorothiazide (HYZAAR) 100-12.5 MG tablet Take 1 tablet by mouth daily.   Multiple Vitamin (MULTIVITAMIN WITH MINERALS) TABS tablet Take 1 tablet by mouth daily.   rosuvastatin (CRESTOR) 10 MG tablet Take 10 mg by mouth daily.   zolpidem (AMBIEN) 10 MG tablet Take 10 mg by mouth as needed for sleep.     Allergies:    Patient has no known allergies.   Social History: Social History   Socioeconomic History    Marital status: Married    Spouse name: Not on file   Number of children: Not on file   Years of education: Not on file   Highest education level: Not on file  Occupational History   Occupation: [employer] doctor  Tobacco Use   Smoking status: Never   Smokeless tobacco: Never  Vaping Use   Vaping status: Never Used  Substance and Sexual Activity   Alcohol use: No   Drug use: No   Sexual activity: Not on file  Other Topics Concern   Not on file  Social History Narrative   Not on file   Social Determinants of Health   Financial Resource Strain: Not on file  Food Insecurity: No Food Insecurity (05/21/2023)   Hunger Vital Sign    Worried About Running Out of Food in the Last Year: Never true    Ran Out of Food in the Last Year: Never true  Transportation Needs: No Transportation Needs (05/21/2023)   PRAPARE - Administrator, Civil Service (Medical): No    Lack of Transportation (Non-Medical): No  Physical Activity: Not on file  Stress: Not on file  Social Connections: Not on file     Family History: The patient's family history includes Cancer in his father; Diabetes in his mother.  ROS:   All other ROS reviewed and negative. Pertinent positives noted in the HPI.     EKGs/Labs/Other Studies Reviewed:   The following studies were personally reviewed by me today:  EKG:  EKG is not ordered today.        TTE 07/04/2023  1. Left ventricular ejection fraction, by estimation, is 55 to 60%. The  left ventricle has normal function. The left ventricle has no regional  wall motion abnormalities. Left ventricular diastolic function could not  be evaluated.   2. Right ventricular systolic function is normal. The right ventricular  size is normal. There is mildly elevated pulmonary artery systolic  pressure.   3. Left atrial size was severely dilated.   4. Right atrial size was mildly dilated.   5. The mitral valve is normal in structure. Trivial mitral valve   regurgitation. No evidence of mitral stenosis.   6. The aortic valve is tricuspid. Aortic valve regurgitation is not  visualized. No aortic stenosis is present.   7. Aortic dilatation noted. There is mild dilatation of the ascending  aorta, measuring 42 mm.   8. The inferior vena cava is dilated in size with <50% respiratory  variability, suggesting right atrial pressure of 15 mmHg.   Recent Labs: 05/21/2023: Magnesium 2.2 05/23/2023: TSH 2.501 05/26/2023: ALT 55; BUN 17; Creatinine, Ser 0.98; Hemoglobin 12.6; Platelets 216; Potassium 4.3; Sodium 138   Recent Lipid Panel No results found for: "CHOL", "TRIG", "HDL", "CHOLHDL", "VLDL", "LDLCALC", "LDLDIRECT"  Physical Exam:   VS:  BP (!) 142/80 (BP Location: Left Arm,  Patient Position: Sitting, Cuff Size: Large)   Pulse (!) 57   Ht 5\' 8"  (1.727 m)   Wt 226 lb 9.6 oz (102.8 kg)   SpO2 100%   BMI 34.45 kg/m    Wt Readings from Last 3 Encounters:  07/18/23 226 lb 9.6 oz (102.8 kg)  07/07/23 223 lb 6.4 oz (101.3 kg)  05/23/23 209 lb 7 oz (95 kg)    General: Well nourished, well developed, in no acute distress Head: Atraumatic, normal size  Eyes: PEERLA, EOMI  Neck: Supple, no JVD Endocrine: No thryomegaly Cardiac: Normal S1, S2; RRR; no murmurs, rubs, or gallops Lungs: Clear to auscultation bilaterally, no wheezing, rhonchi or rales  Abd: Soft, nontender, no hepatomegaly  Ext: No edema, pulses 2+ Musculoskeletal: No deformities, BUE and BLE strength normal and equal Skin: Warm and dry, no rashes   Neuro: Alert and oriented to person, place, time, and situation, CNII-XII grossly intact, no focal deficits  Psych: Normal mood and affect   ASSESSMENT:   Roberto Sanford is a 67 y.o. male who presents for the following: 1. Persistent atrial fibrillation (HCC)   2. Acquired thrombophilia (HCC)   3. Chronic diastolic heart failure (HCC)   4. Mixed hyperlipidemia     PLAN:   1. Persistent atrial fibrillation (HCC) 2. Acquired  thrombophilia (HCC) -EF normal.  Denies any symptoms from his A-fib.  He is rate controlled on no AV nodal agents.  Given his lack of symptoms we have opted for rate control strategy.  He will continue Eliquis 5 mg twice daily.  This is an appropriate dose.  He does have some minor bruising but no significant bleeding.  3. Chronic diastolic heart failure (HCC) -This has been controlled with weight loss.  Not requiring Lasix.  He will use this sparingly if needed.  Does not appear to be needing this.  His blood pressure is a bit elevated but he will continue current medications.  4. Mixed hyperlipidemia -On Crestor 10 mg daily.  Monitor coronary calcification seen on chest imaging.  LDL 88 which is at goal.  Disposition: Return in about 1 year (around 07/17/2024).  Medication Adjustments/Labs and Tests Ordered: Current medicines are reviewed at length with the patient today.  Concerns regarding medicines are outlined above.  No orders of the defined types were placed in this encounter.  No orders of the defined types were placed in this encounter.  Patient Instructions  Medication Instructions:  Your physician recommends that you continue on your current medications as directed. Please refer to the Current Medication list given to you today.  *If you need a refill on your cardiac medications before your next appointment, please call your pharmacy*  Follow-Up: At [facility], you and your health needs are our priority.  As part of our continuing mission to provide you with exceptional heart care, we have created designated Provider Care Teams.  These Care Teams include your primary Cardiologist (physician) and Advanced Practice Providers (APPs -  Physician Assistants and Nurse Practitioners) who all work together to provide you with the care you need, when you need it.  We recommend signing up for the patient portal called "MyChart".  Sign up information is provided on this After  Visit Summary.  MyChart is used to connect with patients for Virtual Visits (Telemedicine).  Patients are able to view lab/test results, encounter notes, upcoming appointments, etc.  Non-urgent messages can be sent to your provider as well.   To learn more about what  you can do with MyChart, go to [email or web address].    Your next appointment:   1 year(s)  Provider:   Dr Flora Lipps      Time Spent with Patient: I have spent a total of 25 minutes with patient reviewing hospital notes, telemetry, EKGs, labs and examining the patient as well as establishing an assessment and plan that was discussed with the patient.  > 50% of time was spent in direct patient care.  Signed, Lenna Gilford. Flora Lipps, MD, [facility]  [facility]  [address], [address] [place], [place] [address] [phone]  07/18/2023 8:27 AM

## 2023-07-18 ENCOUNTER — Ambulatory Visit: Payer: PPO | Attending: Cardiovascular Disease | Admitting: Cardiovascular Disease

## 2023-07-18 ENCOUNTER — Encounter: Payer: Self-pay | Admitting: Cardiovascular Disease

## 2023-07-18 VITALS — BP 142/80 | HR 57 | Ht 68.0 in | Wt 226.6 lb

## 2023-07-18 DIAGNOSIS — E782 Mixed hyperlipidemia: Secondary | ICD-10-CM | POA: Diagnosis not present

## 2023-07-18 DIAGNOSIS — D6869 Other thrombophilia: Secondary | ICD-10-CM | POA: Diagnosis not present

## 2023-07-18 DIAGNOSIS — I5032 Chronic diastolic (congestive) heart failure: Secondary | ICD-10-CM | POA: Diagnosis not present

## 2023-07-18 DIAGNOSIS — I4819 Other persistent atrial fibrillation: Secondary | ICD-10-CM

## 2023-07-18 NOTE — Patient Instructions (Signed)
Medication Instructions:  Your physician recommends that you continue on your current medications as directed. Please refer to the Current Medication list given to you today.  *If you need a refill on your cardiac medications before your next appointment, please call your pharmacy*  Follow-Up: At [facility], you and your health needs are our priority.  As part of our continuing mission to provide you with exceptional heart care, we have created designated Provider Care Teams.  These Care Teams include your primary Cardiologist (physician) and Advanced Practice Providers (APPs -  Physician Assistants and Nurse Practitioners) who all work together to provide you with the care you need, when you need it.  We recommend signing up for the patient portal called "MyChart".  Sign up information is provided on this After Visit Summary.  MyChart is used to connect with patients for Virtual Visits (Telemedicine).  Patients are able to view lab/test results, encounter notes, upcoming appointments, etc.  Non-urgent messages can be sent to your provider as well.   To learn more about what you can do with MyChart, go to [email or web address].    Your next appointment:   1 year(s)  Provider:   Dr Flora Lipps

## 2023-07-22 DIAGNOSIS — M5416 Radiculopathy, lumbar region: Secondary | ICD-10-CM | POA: Diagnosis not present

## 2023-07-25 DIAGNOSIS — M5416 Radiculopathy, lumbar region: Secondary | ICD-10-CM | POA: Diagnosis not present

## 2023-08-06 ENCOUNTER — Other Ambulatory Visit: Payer: Self-pay | Admitting: Cardiovascular Disease

## 2023-08-06 DIAGNOSIS — I10 Essential (primary) hypertension: Secondary | ICD-10-CM | POA: Diagnosis not present

## 2023-08-06 DIAGNOSIS — I4819 Other persistent atrial fibrillation: Secondary | ICD-10-CM

## 2023-08-06 DIAGNOSIS — R748 Abnormal levels of other serum enzymes: Secondary | ICD-10-CM | POA: Diagnosis not present

## 2023-08-06 DIAGNOSIS — I482 Chronic atrial fibrillation, unspecified: Secondary | ICD-10-CM | POA: Diagnosis not present

## 2023-08-06 NOTE — Telephone Encounter (Signed)
Prescription refill request for Eliquis received. Indication: Afib  Last office visit: 07/18/23 ([place])  Scr: 0.98 (05/26/23)  Age: 67 Weight: 102.8kg  Appropriate dose. Refill sent.

## 2023-08-08 DIAGNOSIS — M5416 Radiculopathy, lumbar region: Secondary | ICD-10-CM | POA: Diagnosis not present

## 2023-08-20 DIAGNOSIS — M5416 Radiculopathy, lumbar region: Secondary | ICD-10-CM | POA: Diagnosis not present

## 2023-08-29 DIAGNOSIS — M5416 Radiculopathy, lumbar region: Secondary | ICD-10-CM | POA: Diagnosis not present

## 2023-09-17 DIAGNOSIS — E039 Hypothyroidism, unspecified: Secondary | ICD-10-CM | POA: Diagnosis not present

## 2023-09-17 DIAGNOSIS — M545 Low back pain, unspecified: Secondary | ICD-10-CM | POA: Diagnosis not present

## 2023-09-26 DIAGNOSIS — M545 Low back pain, unspecified: Secondary | ICD-10-CM | POA: Diagnosis not present

## 2023-09-26 DIAGNOSIS — M5416 Radiculopathy, lumbar region: Secondary | ICD-10-CM | POA: Diagnosis not present

## 2023-09-29 DIAGNOSIS — G4733 Obstructive sleep apnea (adult) (pediatric): Secondary | ICD-10-CM | POA: Diagnosis not present

## 2023-10-02 DIAGNOSIS — R972 Elevated prostate specific antigen [PSA]: Secondary | ICD-10-CM | POA: Diagnosis not present

## 2023-10-08 DIAGNOSIS — M5416 Radiculopathy, lumbar region: Secondary | ICD-10-CM | POA: Diagnosis not present

## 2023-10-09 DIAGNOSIS — R972 Elevated prostate specific antigen [PSA]: Secondary | ICD-10-CM | POA: Diagnosis not present

## 2023-10-09 DIAGNOSIS — R3912 Poor urinary stream: Secondary | ICD-10-CM | POA: Diagnosis not present

## 2023-10-09 DIAGNOSIS — R350 Frequency of micturition: Secondary | ICD-10-CM | POA: Diagnosis not present

## 2023-10-09 DIAGNOSIS — N401 Enlarged prostate with lower urinary tract symptoms: Secondary | ICD-10-CM | POA: Diagnosis not present

## 2023-10-10 ENCOUNTER — Telehealth: Payer: Self-pay

## 2023-10-10 NOTE — Telephone Encounter (Signed)
   Pre-operative Risk Assessment    Patient Name: Roberto Sanford  DOB: May 30, 1956 MRN: [id number]  Last ov:07/18/2023 Upcoming visit: Unknown        Request for Surgical Clearance    Procedure:   Biopsy  Date of Surgery:  Clearance TBD                                 Surgeon:  Dr.Eskridge Surgeon's Group or Practice Name:  [facility]  Phone number:  [phone] Fax number:  [phone]   Type of Clearance Requested:   - Medical  - Pharmacy:  Hold Apixaban (Eliquis) Hold 3 days prior    Type of Anesthesia:  Not Indicated   Additional requests/questions:    Vance Peper   10/10/2023, 4:59 PM

## 2023-10-13 NOTE — Telephone Encounter (Signed)
Please advise holding Eliquis prior to urological biopsy.  Thank you!  DW

## 2023-10-14 NOTE — Telephone Encounter (Signed)
   Name: Roberto Sanford  DOB: 01-29-1956  MRN: [id number]  Primary Cardiologist: None   Preoperative team, please contact this patient and set up a phone call appointment for further preoperative risk assessment. Please obtain consent and complete medication review. Thank you for your help.  I confirm that guidance regarding antiplatelet and oral anticoagulation therapy has been completed and, if necessary, noted below.  Per Pharm D, patient may hold Eliquis for 3 days prior to procedure.    I also confirmed the patient resides in the state of [place]. As per [facility] Medical Board telemedicine laws, the patient must reside in the state in which the provider is licensed.   Carlos Levering, NP 10/14/2023, 10:33 AM [facility]

## 2023-10-14 NOTE — Telephone Encounter (Signed)
1st attempt to reach pt regarding surgical clearance and the need for an TELE-VISIT appointment.  Left pt a detailed message to call back and get that scheduled.

## 2023-10-14 NOTE — Telephone Encounter (Signed)
Patient with diagnosis of afib on Eliquis for anticoagulation.    Procedure: biopsy (urologic procedure) Date of procedure: TBD   CHA2DS2-VASc Score = 3   This indicates a 3.2% annual risk of stroke. The patient's score is based upon: CHF History: 1 HTN History: 1 Diabetes History: 0 Stroke History: 0 Vascular Disease History: 0 Age Score: 1 Gender Score: 0      CrCl 85 ml/min Platelet count 216  Per office protocol, patient can hold Eliquis for 3 days prior to procedure.    **This guidance is not considered finalized until pre-operative APP has relayed final recommendations.**

## 2023-10-21 NOTE — Telephone Encounter (Signed)
2nd attempt to reach the pt to set up tele preop appt.  Primary card is Dr. Flora Lipps.

## 2023-10-22 ENCOUNTER — Telehealth: Payer: Self-pay | Admitting: *Deleted

## 2023-10-22 NOTE — Telephone Encounter (Signed)
Pt has been scheduled tele pre op appt 11/14/23 per pt's request. Med rec and consent are done.

## 2023-10-22 NOTE — Telephone Encounter (Signed)
Pt has been scheduled tele pre op appt 11/14/23 per pt's request. Med rec and consent are done.     Patient Consent for Virtual Visit        Roberto Sanford has provided verbal consent on 10/22/2023 for a virtual visit (video or telephone).   CONSENT FOR VIRTUAL VISIT FOR:  Roberto Sanford  By participating in this virtual visit I agree to the following:  I hereby voluntarily request, consent and authorize [facility] and its employed or contracted physicians, physician assistants, nurse practitioners or other licensed health care professionals (the Practitioner), to provide me with telemedicine health care services (the "Services") as deemed necessary by the treating Practitioner. I acknowledge and consent to receive the Services by the Practitioner via telemedicine. I understand that the telemedicine visit will involve communicating with the Practitioner through live audiovisual communication technology and the disclosure of certain medical information by electronic transmission. I acknowledge that I have been given the opportunity to request an in-person assessment or other available alternative prior to the telemedicine visit and am voluntarily participating in the telemedicine visit.  I understand that I have the right to withhold or withdraw my consent to the use of telemedicine in the course of my care at any time, without affecting my right to future care or treatment, and that the Practitioner or I may terminate the telemedicine visit at any time. I understand that I have the right to inspect all information obtained and/or recorded in the course of the telemedicine visit and may receive copies of available information for a reasonable fee.  I understand that some of the potential risks of receiving the Services via telemedicine include:  Delay or interruption in medical evaluation due to technological equipment failure or disruption; Information transmitted may not be sufficient  (e.g. poor resolution of images) to allow for appropriate medical decision making by the Practitioner; and/or  In rare instances, security protocols could fail, causing a breach of personal health information.  Furthermore, I acknowledge that it is my responsibility to provide information about my medical history, conditions and care that is complete and accurate to the best of my ability. I acknowledge that Practitioner's advice, recommendations, and/or decision may be based on factors not within their control, such as incomplete or inaccurate data provided by me or distortions of diagnostic images or specimens that may result from electronic transmissions. I understand that the practice of medicine is not an exact science and that Practitioner makes no warranties or guarantees regarding treatment outcomes. I acknowledge that a copy of this consent can be made available to me via my patient portal [facility] MyChart), or I can request a printed copy by calling the office of [facility].    I understand that my insurance will be billed for this visit.   I have read or had this consent read to me. I understand the contents of this consent, which adequately explains the benefits and risks of the Services being provided via telemedicine.  I have been provided ample opportunity to ask questions regarding this consent and the Services and have had my questions answered to my satisfaction. I give my informed consent for the services to be provided through the use of telemedicine in my medical care

## 2023-11-14 ENCOUNTER — Ambulatory Visit: Payer: PPO | Attending: Cardiology

## 2023-11-14 DIAGNOSIS — Z01810 Encounter for preprocedural cardiovascular examination: Secondary | ICD-10-CM | POA: Diagnosis not present

## 2023-11-14 NOTE — Progress Notes (Signed)
Virtual Visit via Telephone Note   Because of Roberto Sanford's co-morbid illnesses, he is at least at moderate risk for complications without adequate follow up.  This format is felt to be most appropriate for this patient at this time.  The patient did not have access to video technology/had technical difficulties with video requiring transitioning to audio format only (telephone).  All issues noted in this document were discussed and addressed.  No physical exam could be performed with this format.  Please refer to the patient's chart for his consent to telehealth for [facility].  Evaluation Performed:  Preoperative cardiovascular risk assessment _____________   Date:  11/14/2023   Patient ID:  Roberto Sanford, DOB 1956-01-21, MRN [id number] Patient Location:  Home Provider location:   Office  Primary Care Provider:  Merri Brunette, MD Primary Cardiologist:  None  Chief Complaint / Patient Profile   67 y.o. y/o male with a h/o atrial fibrillation, chronic diastolic CHF, HLD who is pending biopsy and presents today for telephonic preoperative cardiovascular risk assessment.  History of Present Illness    Roberto Sanford is a 67 y.o. male who presents via audio/video conferencing for a telehealth visit today.  Pt was last seen in cardiology clinic on 07/18/2023 by her [place].  At that time Roberto Sanford was doing well .  The patient is now pending procedure as outlined above. Since his last visit, he remains stable from a cardiac standpoint.  Today he denies chest pain, shortness of breath, lower extremity edema, fatigue, palpitations, melena, hematuria, hemoptysis, diaphoresis, weakness, presyncope, syncope, orthopnea, and PND.   Past Medical History    Past Medical History:  Diagnosis Date   Anxiety    Arrhythmia    Arthritis    Atrial fibrillation (HCC)    CHF (congestive heart failure) (HCC)    GERD (gastroesophageal reflux disease)    Hypertension     Hypothyroidism    Overactive bladder    takes vesicare and elavil   Sleep apnea    uses CPAP nightly   Past Surgical History:  Procedure Laterality Date   CARPAL TUNNEL RELEASE Right 04/27/2015   Procedure: CARPAL TUNNEL RELEASE RIGHT ;  Surgeon: Cindee Salt, MD;  Location: [facility];  Service: Orthopedics;  Laterality: Right;   CARPAL TUNNEL RELEASE Left 05/18/2015   Procedure: LEFT CARPAL TUNNEL RELEASE;  Surgeon: Cindee Salt, MD;  Location: [facility];  Service: Orthopedics;  Laterality: Left;   CHOLECYSTECTOMY N/A 05/23/2023   Procedure: LAPAROSCOPIC CHOLECYSTECTOMY;  Surgeon: Darnell Level, MD;  Location: [facility];  Service: General;  Laterality: N/A;   ERCP N/A 05/22/2023   Procedure: ENDOSCOPIC RETROGRADE CHOLANGIOPANCREATOGRAPHY (ERCP);  Surgeon: Jeani Hawking, MD;  Location: [facility];  Service: Gastroenterology;  Laterality: N/A;   HERNIA REPAIR     KNEE ARTHROSCOPY Left    REMOVAL OF STONES  05/22/2023   Procedure: REMOVAL OF STONES;  Surgeon: Jeani Hawking, MD;  Location: [facility];  Service: Gastroenterology;;   Dennison Mascot  05/22/2023   Procedure: Dennison Mascot;  Surgeon: Jeani Hawking, MD;  Location: [facility];  Service: Gastroenterology;;   TRIGGER FINGER RELEASE Right 04/27/2015   Procedure: RELEASE A-1 PULLEY PULLEY RIGHT MIDDLE FINGER, RELEASE A-1 PULLEY RIGHT RING FINGER ;  Surgeon: Cindee Salt, MD;  Location: [facility];  Service: Orthopedics;  Laterality: Right;    Allergies  No Known Allergies  Home Medications    Prior to Admission medications   Medication Sig Start  Date End Date Taking? Authorizing Provider  amitriptyline (ELAVIL) 25 MG tablet Take 25 mg by mouth at bedtime.    [provider]  apixaban (ELIQUIS) 5 MG TABS tablet Take 1 tablet (5 mg total) by mouth 2 (two) times daily. 08/06/23   Sande Rives, MD  Cholecalciferol 25 MCG (1000 UT) capsule Take 1,000 Units by mouth daily.     [provider]  furosemide (LASIX) 20 MG tablet Take 1 tablet (20 mg total) by mouth as needed. Patient not taking: Reported on 10/22/2023 05/20/22 05/21/23  Sande Rives, MD  levothyroxine (SYNTHROID) 100 MCG tablet Take 100 mcg by mouth daily before breakfast.    [provider]  losartan-hydrochlorothiazide (HYZAAR) 100-12.5 MG tablet Take 1 tablet by mouth daily. Patient not taking: Reported on 10/22/2023 06/20/22   [provider]  Multiple Vitamin (MULTIVITAMIN WITH MINERALS) TABS tablet Take 1 tablet by mouth daily.    [provider]  ondansetron (ZOFRAN-ODT) 4 MG disintegrating tablet Take 1 tablet (4 mg total) by mouth every 6 (six) hours as needed for nausea. Patient not taking: Reported on 07/18/2023 05/26/23   Alwyn Ren, MD  oxybutynin (DITROPAN-XL) 5 MG 24 hr tablet Take 5 mg by mouth at bedtime. Patient not taking: Reported on 07/18/2023 03/07/22   [provider]  oxyCODONE (OXY IR/ROXICODONE) 5 MG immediate release tablet Take 1 tablet (5 mg total) by mouth every 6 (six) hours as needed for breakthrough pain. Patient not taking: Reported on 07/18/2023 05/26/23   Jacinto Halim, PA-C  pantoprazole (PROTONIX) 40 MG tablet Take 40 mg by mouth daily. Patient not taking: Reported on 07/18/2023 05/20/23   [provider]  polyethylene glycol (MIRALAX / GLYCOLAX) 17 g packet Take 17 g by mouth daily as needed for mild constipation. Patient not taking: Reported on 07/18/2023 05/26/23   Alwyn Ren, MD  rosuvastatin (CRESTOR) 10 MG tablet Take 10 mg by mouth daily. 05/29/21   [provider]  zolpidem (AMBIEN) 10 MG tablet Take 10 mg by mouth as needed for sleep. 07/04/21   [provider]    Physical Exam    Vital Signs:  Roberto Sanford does not have vital signs available for review today.  Given telephonic nature of communication, physical exam is limited. AAOx3. NAD. Normal affect.  Speech and  respirations are unlabored.  Accessory Clinical Findings    None  Assessment & Plan    1.  Preoperative Cardiovascular Risk Assessment:  Biopsy, urologic procedure   Date of Surgery:  Clearance TBD                                 Surgeon:  Dr.Eskridge Surgeon's Group or Practice Name:  [facility]  Phone number:  [phone] Fax number:  [phone]      Primary Cardiologist: Dr. Flora Lipps  Chart reviewed as part of pre-operative protocol coverage. Given past medical history and time since last visit, based on ACC/AHA guidelines, Roberto Sanford would be at acceptable risk for the planned procedure without further cardiovascular testing.   Patient was advised that if he develops new symptoms prior to surgery to contact our office to arrange a follow-up appointment.  He verbalized understanding.  He may hold Eliquis for 3 days prior to procedure.  Please resume as soon as hemostasis is achieved.  I will route this recommendation to the requesting party via Epic fax function  and remove from pre-op pool.  Please call with questions.       Time:   Today, I have spent 7 minutes with the patient with telehealth technology discussing medical history, symptoms, and management plan.     Ronney Asters, NP  11/14/2023, 6:40 AM    Prior to patient's phone evaluation I spent greater than 10 minutes reviewing their past medical history and cardiac medications.

## 2023-11-17 ENCOUNTER — Telehealth: Payer: Self-pay | Admitting: Cardiovascular Disease

## 2023-11-17 NOTE — Telephone Encounter (Signed)
Roberto Sanford, from [facility] is calling to check on status of clearance, as they have not received anything as of yet and patient had tele visit on 12/6.

## 2023-11-17 NOTE — Telephone Encounter (Signed)
I have reviewed notes from Edd Fabian, FNP; pt has been cleared as well as recommendations for medication hold is in the note as well.  I will re-fax notes today to surgeon's office.

## 2023-11-25 DIAGNOSIS — E039 Hypothyroidism, unspecified: Secondary | ICD-10-CM | POA: Diagnosis not present

## 2023-12-01 DIAGNOSIS — G4733 Obstructive sleep apnea (adult) (pediatric): Secondary | ICD-10-CM | POA: Diagnosis not present

## 2023-12-09 DIAGNOSIS — R972 Elevated prostate specific antigen [PSA]: Secondary | ICD-10-CM | POA: Diagnosis not present

## 2023-12-23 DIAGNOSIS — D225 Melanocytic nevi of trunk: Secondary | ICD-10-CM | POA: Diagnosis not present

## 2023-12-23 DIAGNOSIS — L0202 Furuncle of face: Secondary | ICD-10-CM | POA: Diagnosis not present

## 2023-12-23 DIAGNOSIS — L821 Other seborrheic keratosis: Secondary | ICD-10-CM | POA: Diagnosis not present

## 2023-12-23 DIAGNOSIS — D235 Other benign neoplasm of skin of trunk: Secondary | ICD-10-CM | POA: Diagnosis not present

## 2023-12-23 DIAGNOSIS — D485 Neoplasm of uncertain behavior of skin: Secondary | ICD-10-CM | POA: Diagnosis not present

## 2023-12-25 DIAGNOSIS — R350 Frequency of micturition: Secondary | ICD-10-CM | POA: Diagnosis not present

## 2023-12-25 DIAGNOSIS — R3915 Urgency of urination: Secondary | ICD-10-CM | POA: Diagnosis not present

## 2024-01-01 DIAGNOSIS — R350 Frequency of micturition: Secondary | ICD-10-CM | POA: Diagnosis not present

## 2024-01-01 DIAGNOSIS — R3915 Urgency of urination: Secondary | ICD-10-CM | POA: Diagnosis not present

## 2024-01-08 DIAGNOSIS — R3915 Urgency of urination: Secondary | ICD-10-CM | POA: Diagnosis not present

## 2024-01-08 DIAGNOSIS — N3941 Urge incontinence: Secondary | ICD-10-CM | POA: Diagnosis not present

## 2024-01-08 DIAGNOSIS — R350 Frequency of micturition: Secondary | ICD-10-CM | POA: Diagnosis not present

## 2024-01-15 DIAGNOSIS — R3915 Urgency of urination: Secondary | ICD-10-CM | POA: Diagnosis not present

## 2024-01-15 DIAGNOSIS — R350 Frequency of micturition: Secondary | ICD-10-CM | POA: Diagnosis not present

## 2024-01-22 DIAGNOSIS — E039 Hypothyroidism, unspecified: Secondary | ICD-10-CM | POA: Diagnosis not present

## 2024-01-22 DIAGNOSIS — R350 Frequency of micturition: Secondary | ICD-10-CM | POA: Diagnosis not present

## 2024-01-22 DIAGNOSIS — R3915 Urgency of urination: Secondary | ICD-10-CM | POA: Diagnosis not present

## 2024-01-29 DIAGNOSIS — R3915 Urgency of urination: Secondary | ICD-10-CM | POA: Diagnosis not present

## 2024-01-29 DIAGNOSIS — R350 Frequency of micturition: Secondary | ICD-10-CM | POA: Diagnosis not present

## 2024-02-04 DIAGNOSIS — M5030 Other cervical disc degeneration, unspecified cervical region: Secondary | ICD-10-CM | POA: Diagnosis not present

## 2024-02-04 DIAGNOSIS — M51369 Other intervertebral disc degeneration, lumbar region without mention of lumbar back pain or lower extremity pain: Secondary | ICD-10-CM | POA: Diagnosis not present

## 2024-02-04 DIAGNOSIS — M1990 Unspecified osteoarthritis, unspecified site: Secondary | ICD-10-CM | POA: Diagnosis not present

## 2024-02-04 DIAGNOSIS — M79641 Pain in right hand: Secondary | ICD-10-CM | POA: Diagnosis not present

## 2024-02-04 DIAGNOSIS — E039 Hypothyroidism, unspecified: Secondary | ICD-10-CM | POA: Diagnosis not present

## 2024-02-04 DIAGNOSIS — M79642 Pain in left hand: Secondary | ICD-10-CM | POA: Diagnosis not present

## 2024-02-04 DIAGNOSIS — M79644 Pain in right finger(s): Secondary | ICD-10-CM | POA: Diagnosis not present

## 2024-02-05 DIAGNOSIS — R3915 Urgency of urination: Secondary | ICD-10-CM | POA: Diagnosis not present

## 2024-02-05 DIAGNOSIS — R350 Frequency of micturition: Secondary | ICD-10-CM | POA: Diagnosis not present

## 2024-02-06 ENCOUNTER — Telehealth: Payer: Self-pay | Admitting: Cardiovascular Disease

## 2024-02-06 NOTE — Telephone Encounter (Signed)
 Patient is calling to see if it is ok if he can take tumeric with his blood pressure medication apixaban (ELIQUIS) 5 MG TABS tablet

## 2024-02-06 NOTE — Telephone Encounter (Signed)
 Pavero, Cristal Deer, RPH  You2 minutes ago (1:14 PM)    Is known to increase bleeding risk. Recommend trying Tylenol first     Patient identification verified by 2 forms. Marilynn Rail, RN    Called and spoke to patient  Relayed pharmacy message  Patient states:   -needs Rx that can help with inflammation   -tylenol only helps with a little bit of pain  Advised patient to follow up with PCP for further assistance  Patient verbalized understanding, no questions at this time

## 2024-02-10 DIAGNOSIS — G4733 Obstructive sleep apnea (adult) (pediatric): Secondary | ICD-10-CM | POA: Diagnosis not present

## 2024-02-12 DIAGNOSIS — R350 Frequency of micturition: Secondary | ICD-10-CM | POA: Diagnosis not present

## 2024-02-12 DIAGNOSIS — R3915 Urgency of urination: Secondary | ICD-10-CM | POA: Diagnosis not present

## 2024-02-19 DIAGNOSIS — R350 Frequency of micturition: Secondary | ICD-10-CM | POA: Diagnosis not present

## 2024-02-19 DIAGNOSIS — R3915 Urgency of urination: Secondary | ICD-10-CM | POA: Diagnosis not present

## 2024-02-26 DIAGNOSIS — R350 Frequency of micturition: Secondary | ICD-10-CM | POA: Diagnosis not present

## 2024-02-26 DIAGNOSIS — R3915 Urgency of urination: Secondary | ICD-10-CM | POA: Diagnosis not present

## 2024-03-04 DIAGNOSIS — R3915 Urgency of urination: Secondary | ICD-10-CM | POA: Diagnosis not present

## 2024-03-04 DIAGNOSIS — R350 Frequency of micturition: Secondary | ICD-10-CM | POA: Diagnosis not present

## 2024-03-11 DIAGNOSIS — R972 Elevated prostate specific antigen [PSA]: Secondary | ICD-10-CM | POA: Diagnosis not present

## 2024-03-11 DIAGNOSIS — R3915 Urgency of urination: Secondary | ICD-10-CM | POA: Diagnosis not present

## 2024-03-11 DIAGNOSIS — R350 Frequency of micturition: Secondary | ICD-10-CM | POA: Diagnosis not present

## 2024-03-25 DIAGNOSIS — E039 Hypothyroidism, unspecified: Secondary | ICD-10-CM | POA: Diagnosis not present

## 2024-04-08 DIAGNOSIS — R350 Frequency of micturition: Secondary | ICD-10-CM | POA: Diagnosis not present

## 2024-05-14 DIAGNOSIS — G4733 Obstructive sleep apnea (adult) (pediatric): Secondary | ICD-10-CM | POA: Diagnosis not present

## 2024-06-04 DIAGNOSIS — E039 Hypothyroidism, unspecified: Secondary | ICD-10-CM | POA: Diagnosis not present

## 2024-06-04 DIAGNOSIS — M5030 Other cervical disc degeneration, unspecified cervical region: Secondary | ICD-10-CM | POA: Diagnosis not present

## 2024-06-04 DIAGNOSIS — K219 Gastro-esophageal reflux disease without esophagitis: Secondary | ICD-10-CM | POA: Diagnosis not present

## 2024-06-04 DIAGNOSIS — M79644 Pain in right finger(s): Secondary | ICD-10-CM | POA: Diagnosis not present

## 2024-06-04 DIAGNOSIS — M1990 Unspecified osteoarthritis, unspecified site: Secondary | ICD-10-CM | POA: Diagnosis not present

## 2024-06-04 DIAGNOSIS — M51369 Other intervertebral disc degeneration, lumbar region without mention of lumbar back pain or lower extremity pain: Secondary | ICD-10-CM | POA: Diagnosis not present

## 2024-06-05 ENCOUNTER — Other Ambulatory Visit: Payer: Self-pay | Admitting: Cardiovascular Disease

## 2024-06-05 DIAGNOSIS — I4819 Other persistent atrial fibrillation: Secondary | ICD-10-CM

## 2024-06-07 NOTE — Telephone Encounter (Signed)
 Prescription refill request for Eliquis  received. Indication: AF Last office visit: 07/18/23  LELON Decent MD Scr: 1.2 on 01/15/24  [facility] Age: 68 Weight: 102.8kg  Based on above findings Eliquis  5mg  twice daily is the appropriate dose.  Refill approved.

## 2024-06-14 DIAGNOSIS — I10 Essential (primary) hypertension: Secondary | ICD-10-CM | POA: Diagnosis not present

## 2024-06-14 DIAGNOSIS — R7303 Prediabetes: Secondary | ICD-10-CM | POA: Diagnosis not present

## 2024-06-14 DIAGNOSIS — Z125 Encounter for screening for malignant neoplasm of prostate: Secondary | ICD-10-CM | POA: Diagnosis not present

## 2024-06-16 DIAGNOSIS — E875 Hyperkalemia: Secondary | ICD-10-CM | POA: Diagnosis not present

## 2024-06-21 DIAGNOSIS — E039 Hypothyroidism, unspecified: Secondary | ICD-10-CM | POA: Diagnosis not present

## 2024-06-21 DIAGNOSIS — N3281 Overactive bladder: Secondary | ICD-10-CM | POA: Diagnosis not present

## 2024-06-21 DIAGNOSIS — I2510 Atherosclerotic heart disease of native coronary artery without angina pectoris: Secondary | ICD-10-CM | POA: Diagnosis not present

## 2024-06-21 DIAGNOSIS — Z0000 Encounter for general adult medical examination without abnormal findings: Secondary | ICD-10-CM | POA: Diagnosis not present

## 2024-06-21 DIAGNOSIS — M79602 Pain in left arm: Secondary | ICD-10-CM | POA: Diagnosis not present

## 2024-06-21 DIAGNOSIS — G4700 Insomnia, unspecified: Secondary | ICD-10-CM | POA: Diagnosis not present

## 2024-06-21 DIAGNOSIS — I5032 Chronic diastolic (congestive) heart failure: Secondary | ICD-10-CM | POA: Diagnosis not present

## 2024-06-21 DIAGNOSIS — I10 Essential (primary) hypertension: Secondary | ICD-10-CM | POA: Diagnosis not present

## 2024-06-21 DIAGNOSIS — I4821 Permanent atrial fibrillation: Secondary | ICD-10-CM | POA: Diagnosis not present

## 2024-06-21 DIAGNOSIS — I700 Atherosclerosis of aorta: Secondary | ICD-10-CM | POA: Diagnosis not present

## 2024-06-21 DIAGNOSIS — F320 Major depressive disorder, single episode, mild: Secondary | ICD-10-CM | POA: Diagnosis not present

## 2024-06-21 DIAGNOSIS — D6869 Other thrombophilia: Secondary | ICD-10-CM | POA: Diagnosis not present

## 2024-06-30 NOTE — Progress Notes (Unsigned)
    Ben Jackson D[facility] [address] [place] [address] Phone: [phone]   Assessment and Plan:     There are no diagnoses linked to this encounter.  ***   Pertinent previous records reviewed include ***    Follow Up: ***     Subjective:   I, Eliott Amparan, am serving as a Neurosurgeon for Doctor Morene Mace  Chief Complaint: left bicep pain   HPI:   07/01/2024 Patient is a 68 year old male with left bicep pain. Patient states  Relevant Historical Information: ***  Additional pertinent review of systems negative.   Current Outpatient Medications:    amitriptyline  (ELAVIL ) 25 MG tablet, Take 25 mg by mouth at bedtime., Disp: , Rfl:    Cholecalciferol 25 MCG (1000 UT) capsule, Take 1,000 Units by mouth daily., Disp: , Rfl:    ELIQUIS  5 MG TABS tablet, TAKE 1 TABLET BY MOUTH TWICE A DAY, Disp: 60 tablet, Rfl: 5   furosemide  (LASIX ) 20 MG tablet, Take 1 tablet (20 mg total) by mouth as needed. (Patient not taking: Reported on 10/22/2023), Disp: 90 tablet, Rfl: 3   levothyroxine  (SYNTHROID ) 100 MCG tablet, Take 100 mcg by mouth daily before breakfast., Disp: , Rfl:    losartan-hydrochlorothiazide (HYZAAR) 100-12.5 MG tablet, Take 1 tablet by mouth daily. (Patient not taking: Reported on 10/22/2023), Disp: , Rfl:    Multiple Vitamin (MULTIVITAMIN WITH MINERALS) TABS tablet, Take 1 tablet by mouth daily., Disp: , Rfl:    ondansetron  (ZOFRAN -ODT) 4 MG disintegrating tablet, Take 1 tablet (4 mg total) by mouth every 6 (six) hours as needed for nausea. (Patient not taking: Reported on 07/18/2023), Disp: 20 tablet, Rfl: 0   oxybutynin (DITROPAN-XL) 5 MG 24 hr tablet, Take 5 mg by mouth at bedtime. (Patient not taking: Reported on 07/18/2023), Disp: , Rfl:    oxyCODONE  (OXY IR/ROXICODONE ) 5 MG immediate release tablet, Take 1 tablet (5 mg total) by mouth every 6 (six) hours as needed for breakthrough pain. (Patient not taking: Reported on  07/18/2023), Disp: 15 tablet, Rfl: 0   pantoprazole (PROTONIX) 40 MG tablet, Take 40 mg by mouth daily. (Patient not taking: Reported on 07/18/2023), Disp: , Rfl:    polyethylene glycol (MIRALAX  / GLYCOLAX ) 17 g packet, Take 17 g by mouth daily as needed for mild constipation. (Patient not taking: Reported on 07/18/2023), Disp: 14 each, Rfl: 0   rosuvastatin  (CRESTOR ) 10 MG tablet, Take 10 mg by mouth daily., Disp: , Rfl:    zolpidem  (AMBIEN ) 10 MG tablet, Take 10 mg by mouth as needed for sleep., Disp: , Rfl:    Objective:     There were no vitals filed for this visit.    There is no height or weight on file to calculate BMI.    Physical Exam:    ***   Electronically signed by:  Odis Mace [facility] 7:32 AM 06/30/24

## 2024-07-01 ENCOUNTER — Ambulatory Visit (INDEPENDENT_AMBULATORY_CARE_PROVIDER_SITE_OTHER)

## 2024-07-01 ENCOUNTER — Ambulatory Visit: Admitting: Sports Medicine

## 2024-07-01 VITALS — HR 42 | Ht 68.0 in | Wt 226.0 lb

## 2024-07-01 DIAGNOSIS — S46212A Strain of muscle, fascia and tendon of other parts of biceps, left arm, initial encounter: Secondary | ICD-10-CM

## 2024-07-01 DIAGNOSIS — M19012 Primary osteoarthritis, left shoulder: Secondary | ICD-10-CM | POA: Diagnosis not present

## 2024-07-01 DIAGNOSIS — M25512 Pain in left shoulder: Secondary | ICD-10-CM | POA: Diagnosis not present

## 2024-07-01 MED ORDER — METHYLPREDNISOLONE 4 MG PO TBPK: ORAL_TABLET | ORAL | 0 refills | Status: DC

## 2024-07-01 NOTE — Patient Instructions (Addendum)
 Bicep HEP Xrays on the way out   Prednisone dos pak  PT referral  6 week follow up

## 2024-07-08 ENCOUNTER — Ambulatory Visit: Payer: Self-pay | Admitting: Sports Medicine

## 2024-07-08 DIAGNOSIS — G4733 Obstructive sleep apnea (adult) (pediatric): Secondary | ICD-10-CM | POA: Diagnosis not present

## 2024-07-14 DIAGNOSIS — M79622 Pain in left upper arm: Secondary | ICD-10-CM | POA: Diagnosis not present

## 2024-07-16 DIAGNOSIS — M79622 Pain in left upper arm: Secondary | ICD-10-CM | POA: Diagnosis not present

## 2024-07-19 DIAGNOSIS — M79622 Pain in left upper arm: Secondary | ICD-10-CM | POA: Diagnosis not present

## 2024-07-21 DIAGNOSIS — M79622 Pain in left upper arm: Secondary | ICD-10-CM | POA: Diagnosis not present

## 2024-07-22 DIAGNOSIS — N401 Enlarged prostate with lower urinary tract symptoms: Secondary | ICD-10-CM | POA: Diagnosis not present

## 2024-07-22 DIAGNOSIS — R350 Frequency of micturition: Secondary | ICD-10-CM | POA: Diagnosis not present

## 2024-07-22 DIAGNOSIS — R972 Elevated prostate specific antigen [PSA]: Secondary | ICD-10-CM | POA: Diagnosis not present

## 2024-07-26 DIAGNOSIS — M79622 Pain in left upper arm: Secondary | ICD-10-CM | POA: Diagnosis not present

## 2024-07-28 DIAGNOSIS — M79622 Pain in left upper arm: Secondary | ICD-10-CM | POA: Diagnosis not present

## 2024-08-02 DIAGNOSIS — M79622 Pain in left upper arm: Secondary | ICD-10-CM | POA: Diagnosis not present

## 2024-08-04 DIAGNOSIS — M79622 Pain in left upper arm: Secondary | ICD-10-CM | POA: Diagnosis not present

## 2024-08-10 DIAGNOSIS — M79622 Pain in left upper arm: Secondary | ICD-10-CM | POA: Diagnosis not present

## 2024-08-11 NOTE — Progress Notes (Unsigned)
 Ben Jackson D[facility] [address] [place] [address] Phone: [phone]   Assessment and Plan:     1. Biceps strain, left, initial encounter (Primary) -Chronic with exacerbation, subsequent visit - Continued pain in left biceps muscle and proximal tendon x 1 to 2 years with history of left shoulder arthroscopy on 07/03/2022 with biceps tenodesis.  Only mild improvement in symptoms with conservative therapy that has included prednisone course, home exercise program for >6 weeks - Recommend further evaluation with MRI of left shoulder due to failure to improve despite >6 weeks of conservative therapy, pain with day-to-day activities, pain >6/10 - Continue HEP as tolerated - Discontinue prednisone Dosepak - Do not recommend NSAIDs due to chronic anticoagulation on Eliquis   2. Bilateral hip pain -Acute, initial visit - Most consistent with gluteal muscle strain with increased physical activity at the gym - Start HEP for bilateral hips and gluteal muscles  15 additional minutes spent for educating Therapeutic Home Exercise Program.  This included exercises focusing on stretching, strengthening, with focus on eccentric aspects.   Long term goals include an improvement in range of motion, strength, endurance as well as avoiding reinjury. Patient's frequency would include in 1-2 times a day, 3-5 times a week for a duration of 6-12 weeks. Proper technique shown and discussed handout in great detail with ATC.  All questions were discussed and answered.    3. Claustrophobia -Patient with history of claustrophobia.  May use Ativan  0.5 mg 1 to 2 tablets as needed for MRI    Pertinent previous records reviewed include none   Follow Up: 5 days after MRI to review results and discuss treatment plan   Subjective:   I, Roberto Sanford, am serving as a Neurosurgeon for Doctor Morene Mace   Chief Complaint: left bicep pain    HPI:     07/01/2024 Patient is a 68 year old male with left bicep pain. Patient states pain for a couple of years. No MOI. Sharp pain when he lifts something heavy. No numbness or tingling. No radiating pain. No meds for the pain. Decreased ROM   08/12/2024 Patient states he is a little better    Relevant Historical Information: A-fib on chronic anticoagulation with Eliquis , GERD, OSA  Additional pertinent review of systems negative.   Current Outpatient Medications:    amitriptyline  (ELAVIL ) 25 MG tablet, Take 25 mg by mouth at bedtime., Disp: , Rfl:    Cholecalciferol 25 MCG (1000 UT) capsule, Take 1,000 Units by mouth daily., Disp: , Rfl:    ELIQUIS  5 MG TABS tablet, TAKE 1 TABLET BY MOUTH TWICE A DAY, Disp: 60 tablet, Rfl: 5   levothyroxine  (SYNTHROID ) 100 MCG tablet, Take 100 mcg by mouth daily before breakfast., Disp: , Rfl:    methylPREDNISolone  (MEDROL  DOSEPAK) 4 MG TBPK tablet, Take 6 tablets on day 1.  Take 5 tablets on day 2.  Take 4 tablets on day 3.  Take 3 tablets on day 4.  Take 2 tablets on day 5.  Take 1 tablet on day 6., Disp: 21 tablet, Rfl: 0   Multiple Vitamin (MULTIVITAMIN WITH MINERALS) TABS tablet, Take 1 tablet by mouth daily., Disp: , Rfl:    rosuvastatin  (CRESTOR ) 10 MG tablet, Take 10 mg by mouth daily., Disp: , Rfl:    zolpidem  (AMBIEN ) 10 MG tablet, Take 10 mg by mouth as needed for sleep., Disp: , Rfl:    Objective:     Vitals:   08/12/24 0904  Pulse: (!) 51  SpO2: 99%  Weight: 248 lb (112.5 kg)  Height: 5' 8 (1.727 m)      Body mass index is 37.71 kg/m.    Physical Exam:    Gen: Appears well, nad, nontoxic and pleasant Neuro:sensation intact, strength is 5/5 with df/pf/inv/ev, muscle tone wnl Skin: no suspicious lesion or defmority Psych: A&O, appropriate mood and affect   Left shoulder:  No deformity, swelling or muscle wasting No scapular winging FF 180, abd 180, int 0, ext 90 TTP biceps muscle medially and biceps groove NTTP over the [place],  clavicle, ac, coracoid,  humerus, deltoid, trapezius, cervical spine Neg neer, hawkins, empty can, obriens, crossarm, subscap liftoff, Positive speeds Neg ant drawer, sulcus sign, apprehension Negative Spurling's test bilat FROM of neck  Pain with resisted elbow flexion with hand and full pronation.  No pain with resisted elbow flexion with hand and supination or midway rotation    Electronically signed by:  Odis Mace [facility] 9:31 AM 08/12/24

## 2024-08-12 ENCOUNTER — Ambulatory Visit: Admitting: Sports Medicine

## 2024-08-12 VITALS — HR 51 | Ht 68.0 in | Wt 248.0 lb

## 2024-08-12 DIAGNOSIS — S46212A Strain of muscle, fascia and tendon of other parts of biceps, left arm, initial encounter: Secondary | ICD-10-CM | POA: Diagnosis not present

## 2024-08-12 DIAGNOSIS — M25552 Pain in left hip: Secondary | ICD-10-CM | POA: Diagnosis not present

## 2024-08-12 DIAGNOSIS — M25551 Pain in right hip: Secondary | ICD-10-CM | POA: Diagnosis not present

## 2024-08-12 DIAGNOSIS — F40240 Claustrophobia: Secondary | ICD-10-CM | POA: Diagnosis not present

## 2024-08-12 MED ORDER — LORAZEPAM 0.5 MG PO TABS
ORAL_TABLET | ORAL | 0 refills | Status: AC
Start: 2024-08-12 — End: ?

## 2024-08-12 NOTE — Patient Instructions (Addendum)
 MRI referral   Hip HEP   Ativan    Follow up 5 days after MRI to discuss results

## 2024-08-31 ENCOUNTER — Ambulatory Visit
Admission: RE | Admit: 2024-08-31 | Discharge: 2024-08-31 | Disposition: A | Discharge status: Home / Self Care | Source: Ambulatory Visit | Attending: Sports Medicine | Admitting: Sports Medicine

## 2024-08-31 DIAGNOSIS — M19012 Primary osteoarthritis, left shoulder: Secondary | ICD-10-CM | POA: Diagnosis not present

## 2024-08-31 DIAGNOSIS — S46212A Strain of muscle, fascia and tendon of other parts of biceps, left arm, initial encounter: Secondary | ICD-10-CM

## 2024-09-01 ENCOUNTER — Ambulatory Visit: Payer: Self-pay | Admitting: Sports Medicine

## 2024-09-02 NOTE — Progress Notes (Unsigned)
 Ben Jackson D[facility] [address] [place] [address] Phone: [phone]   Assessment and Plan:     1. Biceps strain, left, subsequent encounter (Primary) 2. Rotator cuff tendonitis, left -Chronic with exacerbation, subsequent visit - Continued pain in left biceps muscle and tendon that has been present for 1 to 2 years.  Reviewed patient's left shoulder MRI from 08/31/2024 which showed mild supraspinatus and infraspinatus insertional tendinitis, evidence of prior long head of biceps tenodesis, mild AC joint OA, but otherwise unremarkable.  No current explanation for patient's ongoing pain - Recommend continuing HEP and physical therapy - May Voltaren gel topically over areas of pain -Do not recommend NSAIDs due to chronic anticoagulation on Eliquis   Pertinent previous records reviewed include left shoulder MRI 08/31/24   Follow Up: As needed.  Could consider CSI to biceps tendon groove versus subacromial based on presentation in the future   Subjective:   I, Shamel Germond, am serving as a Neurosurgeon for Doctor Morene Mace   Chief Complaint: left bicep pain    HPI:    07/01/2024 Patient is a 68 year old male with left bicep pain. Patient states pain for a couple of years. No MOI. Sharp pain when he lifts something heavy. No numbness or tingling. No radiating pain. No meds for the pain. Decreased ROM    08/12/2024 Patient states he is a little better   09/03/2024 Patient states he is a little better    Relevant Historical Information: A-fib on chronic anticoagulation with Eliquis , GERD, OSA  Additional pertinent review of systems negative.   Current Outpatient Medications:    amitriptyline  (ELAVIL ) 25 MG tablet, Take 25 mg by mouth at bedtime., Disp: , Rfl:    Cholecalciferol 25 MCG (1000 UT) capsule, Take 1,000 Units by mouth daily., Disp: , Rfl:    ELIQUIS  5 MG TABS tablet, TAKE 1 TABLET BY MOUTH TWICE A DAY, Disp: 60 tablet,  Rfl: 5   levothyroxine  (SYNTHROID ) 100 MCG tablet, Take 100 mcg by mouth daily before breakfast., Disp: , Rfl:    LORazepam  (ATIVAN ) 0.5 MG tablet, 1-2 tabs 30 - 60 min prior to MRI. Do not drive with this medicine., Disp: 4 tablet, Rfl: 0   methylPREDNISolone  (MEDROL  DOSEPAK) 4 MG TBPK tablet, Take 6 tablets on day 1.  Take 5 tablets on day 2.  Take 4 tablets on day 3.  Take 3 tablets on day 4.  Take 2 tablets on day 5.  Take 1 tablet on day 6., Disp: 21 tablet, Rfl: 0   Multiple Vitamin (MULTIVITAMIN WITH MINERALS) TABS tablet, Take 1 tablet by mouth daily., Disp: , Rfl:    rosuvastatin  (CRESTOR ) 10 MG tablet, Take 10 mg by mouth daily., Disp: , Rfl:    zolpidem  (AMBIEN ) 10 MG tablet, Take 10 mg by mouth as needed for sleep., Disp: , Rfl:    Objective:     Vitals:   09/03/24 1448  Pulse: 94  SpO2: 98%  Weight: 236 lb (107 kg)  Height: 5' 8 (1.727 m)      Body mass index is 35.88 kg/m.    Physical Exam:    Gen: Appears well, nad, nontoxic and pleasant Neuro:sensation intact, strength is 5/5 with df/pf/inv/ev, muscle tone wnl Skin: no suspicious lesion or defmority Psych: A&O, appropriate mood and affect   Left shoulder:  No deformity, swelling or muscle wasting No scapular winging FF 180, abd 180, int 0, ext 90 TTP biceps muscle medially and  biceps groove NTTP over the [place], clavicle, ac, coracoid,  humerus, deltoid, trapezius, cervical spine Neg neer, hawkins, empty can, obriens, crossarm, subscap liftoff, Positive speeds Neg ant drawer, sulcus sign, apprehension Negative Spurling's test bilat FROM of neck  Pain with resisted elbow flexion with hand and full pronation.  No pain with resisted elbow flexion with hand and supination or midway rotation   Electronically signed by:  Odis Mace [facility] 3:25 PM 09/03/24

## 2024-09-03 ENCOUNTER — Ambulatory Visit: Admitting: Sports Medicine

## 2024-09-03 VITALS — HR 94 | Ht 68.0 in | Wt 236.0 lb

## 2024-09-03 DIAGNOSIS — S46212D Strain of muscle, fascia and tendon of other parts of biceps, left arm, subsequent encounter: Secondary | ICD-10-CM | POA: Diagnosis not present

## 2024-09-03 DIAGNOSIS — M7582 Other shoulder lesions, left shoulder: Secondary | ICD-10-CM | POA: Diagnosis not present

## 2024-09-03 NOTE — Patient Instructions (Signed)
 Recommend starting PT   Continue HEP   Voltaren gel over areas of pain   As needed follow up for injections

## 2024-09-10 DIAGNOSIS — M79622 Pain in left upper arm: Secondary | ICD-10-CM | POA: Diagnosis not present

## 2024-09-15 DIAGNOSIS — M79622 Pain in left upper arm: Secondary | ICD-10-CM | POA: Diagnosis not present

## 2024-09-22 DIAGNOSIS — M79622 Pain in left upper arm: Secondary | ICD-10-CM | POA: Diagnosis not present

## 2024-09-29 DIAGNOSIS — G4733 Obstructive sleep apnea (adult) (pediatric): Secondary | ICD-10-CM | POA: Diagnosis not present

## 2024-09-30 DIAGNOSIS — G4733 Obstructive sleep apnea (adult) (pediatric): Secondary | ICD-10-CM | POA: Diagnosis not present

## 2024-10-11 ENCOUNTER — Telehealth: Payer: Self-pay | Admitting: Sports Medicine

## 2024-10-11 NOTE — Telephone Encounter (Signed)
 Patient called stating that he has been seen in the past by Dr Leonce for sciatic pain. He feels like he is having a flare up again and asked if Dr Leonce would be able to send in Prednisone for him? He said that he has a trip coming up and was wanting to try to take care of it before he left.  Please advise.

## 2024-10-12 MED ORDER — METHYLPREDNISOLONE 4 MG PO TBPK: ORAL_TABLET | ORAL | 0 refills | Status: AC

## 2024-10-12 NOTE — Telephone Encounter (Signed)
Can you send this in?

## 2024-10-12 NOTE — Telephone Encounter (Signed)
Sent in dosepak

## 2024-11-12 ENCOUNTER — Ambulatory Visit: Admitting: Sports Medicine

## 2024-11-12 VITALS — Ht 68.0 in | Wt 236.0 lb

## 2024-11-12 DIAGNOSIS — M25552 Pain in left hip: Secondary | ICD-10-CM

## 2024-11-12 DIAGNOSIS — M7062 Trochanteric bursitis, left hip: Secondary | ICD-10-CM | POA: Diagnosis not present

## 2024-11-12 NOTE — Patient Instructions (Signed)
Hip HEP  As needed follow up

## 2024-11-12 NOTE — Progress Notes (Signed)
 [facility] [address] [place] [address] Phone: [phone]   Assessment and Plan:     1. Pain of left hip (Primary) 2. 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  Tolerated procedure well per note below.  CSI has increased bleeding risk with patient on chronic anticoagulation with Eliquis  - Use Tylenol  500 to 1000 mg tablets 2-3 times a day for day-to-day pain relief - Restart HEP for gluteal musculature and hip - Patient has had moderate, though short-term relief from prednisone courses in the past.  Will not repeat prednisone course at this time  Procedure: Greater trochanteric bursal injection Side: Left  Risks explained and consent was given verbally. The site was cleaned with alcohol prep. A steroid injection was performed with patient in the lateral side-lying position at area of maximum tenderness over greater trochanter using 2mL of 1% lidocaine  without epinephrine and 1mL of kenalog 40mg /ml. This was well tolerated.  Needle was removed, hemostasis achieved, and post injection instructions were explained.  Pt was advised to call or return to clinic if these symptoms worsen or fail to improve as anticipated.      Pertinent previous records reviewed include none   Follow Up: As needed if no improvement or worsening of symptoms.  If no improvement, could consider lumbar etiology versus physical therapy for gluteal musculature, hip   Subjective:   I, Roberto Sanford, am serving as a neurosurgeon for Doctor Roberto Sanford  Chief Complaint: low back pain   HPI:   11/12/24 Patient is a 68 year old male with low back pain. Patient states low back pain 3-4  weeks ago. No MOI. Pain radiates down to his hip. Decreased ROM. Pain when standing. No numbness or tingling. Tylenol  doesn't help  Relevant Historical Information: History of atrial fibrillation on Eliquis , GERD  Additional pertinent review of systems negative.   Current Outpatient Medications:    amitriptyline  (ELAVIL ) 25 MG tablet, Take 25 mg by mouth at bedtime., Disp: , Rfl:    Cholecalciferol 25 MCG (1000 UT) capsule, Take 1,000 Units by mouth daily., Disp: , Rfl:    ELIQUIS  5 MG TABS tablet, TAKE 1 TABLET BY MOUTH TWICE A DAY, Disp: 60 tablet, Rfl: 5   levothyroxine  (SYNTHROID ) 100 MCG tablet, Take 100 mcg by mouth daily before breakfast., Disp: , Rfl:    LORazepam  (ATIVAN ) 0.5 MG tablet, 1-2 tabs 30 - 60 min prior to MRI. Do not drive with this medicine., Disp: 4 tablet, Rfl: 0   methylPREDNISolone  (MEDROL  DOSEPAK) 4 MG TBPK tablet, Take 6 tablets on day 1.  Take 5 tablets on day 2.  Take 4 tablets on day 3.  Take 3 tablets on day 4.  Take 2 tablets on day 5.  Take 1 tablet on day 6., Disp: 21 tablet, Rfl: 0   Multiple Vitamin (MULTIVITAMIN WITH MINERALS) TABS tablet, Take 1 tablet by mouth daily., Disp: , Rfl:    rosuvastatin  (CRESTOR ) 10 MG tablet, Take 10 mg by mouth daily., Disp: , Rfl:    zolpidem  (AMBIEN ) 10 MG tablet, Take 10 mg by mouth as needed for sleep., Disp: , Rfl:    Objective:     Vitals:   11/12/24 0857  Weight: 236 lb (107 kg)  Height: 5' 8 (1.727 m)      Body mass index is 35.88 kg/m.    Physical Exam:    General: awake, alert, and oriented no acute distress, nontoxic Skin: no suspicious lesions or rashes Neuro:sensation intact distally with no deficits, normal muscle tone, no atrophy, strength 5/5 in all tested lower ext groups Psych: normal mood and affect, speech clear   Left hip: No deformity, swelling or wasting ROM Flexion 90, ext 30, IR 45, ER 45 TTP greater trochanter, gluteal musculature NTTP over the hip flexors, si joint, lumbar  spine Negative log roll with FROM Negative FABER Negative FADIR Negative Piriformis test for radicular symptoms, though reproduced gluteal tension/pain Gait normal    Electronically signed by:  [facility] 9:13 AM 11/12/24

## 2024-12-31 ENCOUNTER — Other Ambulatory Visit: Payer: Self-pay | Admitting: Cardiovascular Disease

## 2024-12-31 DIAGNOSIS — I4819 Other persistent atrial fibrillation: Secondary | ICD-10-CM
